# Patient Record
Sex: Female | Born: 1980 | Race: White | Hispanic: No | Smoking: Never smoker
Health system: Southern US, Community
[De-identification: ages and names within clinical notes are randomized; demographics above are authoritative.]

## PROBLEM LIST (undated history)

## (undated) VITALS — BP 93/60 | HR 90 | Temp 97.5°F | Resp 18 | Ht 63.0 in | Wt 134.0 lb

## (undated) DIAGNOSIS — F32A Depression, unspecified: Secondary | ICD-10-CM

## (undated) DIAGNOSIS — M8580 Other specified disorders of bone density and structure, unspecified site: Secondary | ICD-10-CM

## (undated) DIAGNOSIS — R519 Headache, unspecified: Secondary | ICD-10-CM

## (undated) DIAGNOSIS — F419 Anxiety disorder, unspecified: Secondary | ICD-10-CM

## (undated) DIAGNOSIS — K589 Irritable bowel syndrome without diarrhea: Secondary | ICD-10-CM

## (undated) DIAGNOSIS — K219 Gastro-esophageal reflux disease without esophagitis: Secondary | ICD-10-CM

## (undated) DIAGNOSIS — F509 Eating disorder, unspecified: Secondary | ICD-10-CM

## (undated) DIAGNOSIS — G8929 Other chronic pain: Secondary | ICD-10-CM

## (undated) DIAGNOSIS — N814 Uterovaginal prolapse, unspecified: Secondary | ICD-10-CM

## (undated) DIAGNOSIS — E8940 Asymptomatic postprocedural ovarian failure: Secondary | ICD-10-CM

## (undated) DIAGNOSIS — M797 Fibromyalgia: Secondary | ICD-10-CM

## (undated) DIAGNOSIS — F4310 Post-traumatic stress disorder, unspecified: Secondary | ICD-10-CM

## (undated) DIAGNOSIS — M810 Age-related osteoporosis without current pathological fracture: Secondary | ICD-10-CM

## (undated) DIAGNOSIS — R51 Headache: Secondary | ICD-10-CM

## (undated) DIAGNOSIS — K449 Diaphragmatic hernia without obstruction or gangrene: Secondary | ICD-10-CM

## (undated) DIAGNOSIS — F329 Major depressive disorder, single episode, unspecified: Secondary | ICD-10-CM

## (undated) HISTORY — DX: Diaphragmatic hernia without obstruction or gangrene: K44.9

## (undated) HISTORY — DX: Anxiety disorder, unspecified: F41.9

## (undated) HISTORY — DX: Headache, unspecified: R51.9

## (undated) HISTORY — PX: BILATERAL OOPHORECTOMY: SHX1221

## (undated) HISTORY — DX: Uterovaginal prolapse, unspecified: N81.4

## (undated) HISTORY — PX: ABDOMINAL HYSTERECTOMY: SHX81

## (undated) HISTORY — PX: RECTOCELE REPAIR: SHX761

## (undated) HISTORY — DX: Asymptomatic postprocedural ovarian failure: E89.40

## (undated) HISTORY — DX: Age-related osteoporosis without current pathological fracture: M81.0

## (undated) HISTORY — DX: Gastro-esophageal reflux disease without esophagitis: K21.9

## (undated) HISTORY — DX: Headache: R51

## (undated) HISTORY — DX: Fibromyalgia: M79.7

## (undated) HISTORY — DX: Irritable bowel syndrome, unspecified: K58.9

## (undated) HISTORY — DX: Other chronic pain: G89.29

---

## 2005-09-16 ENCOUNTER — Emergency Department (HOSPITAL_COMMUNITY): Admission: EM | Admit: 2005-09-16 | Discharge: 2005-09-16 | Payer: Self-pay | Admitting: Emergency Medicine

## 2006-06-13 ENCOUNTER — Other Ambulatory Visit: Admission: RE | Admit: 2006-06-13 | Discharge: 2006-06-13 | Payer: Self-pay | Admitting: Obstetrics and Gynecology

## 2006-06-27 ENCOUNTER — Encounter: Admission: RE | Admit: 2006-06-27 | Discharge: 2006-06-27 | Payer: Self-pay | Admitting: Obstetrics and Gynecology

## 2006-06-27 LAB — HM DEXA SCAN

## 2006-09-26 ENCOUNTER — Emergency Department (HOSPITAL_COMMUNITY): Admission: EM | Admit: 2006-09-26 | Discharge: 2006-09-26 | Payer: Self-pay | Admitting: Emergency Medicine

## 2006-10-07 ENCOUNTER — Ambulatory Visit (HOSPITAL_COMMUNITY): Admission: RE | Admit: 2006-10-07 | Discharge: 2006-10-07 | Payer: Self-pay | Admitting: Obstetrics and Gynecology

## 2006-10-28 ENCOUNTER — Inpatient Hospital Stay (HOSPITAL_COMMUNITY): Admission: AD | Admit: 2006-10-28 | Discharge: 2006-10-28 | Payer: Self-pay | Admitting: Obstetrics and Gynecology

## 2007-01-30 ENCOUNTER — Inpatient Hospital Stay (HOSPITAL_COMMUNITY): Admission: AD | Admit: 2007-01-30 | Discharge: 2007-01-30 | Payer: Self-pay | Admitting: Obstetrics and Gynecology

## 2007-03-16 ENCOUNTER — Inpatient Hospital Stay (HOSPITAL_COMMUNITY): Admission: AD | Admit: 2007-03-16 | Discharge: 2007-03-16 | Payer: Self-pay | Admitting: Obstetrics and Gynecology

## 2007-04-03 ENCOUNTER — Inpatient Hospital Stay (HOSPITAL_COMMUNITY): Admission: AD | Admit: 2007-04-03 | Discharge: 2007-04-04 | Payer: Self-pay | Admitting: Obstetrics and Gynecology

## 2007-05-19 ENCOUNTER — Inpatient Hospital Stay (HOSPITAL_COMMUNITY): Admission: AD | Admit: 2007-05-19 | Discharge: 2007-05-21 | Payer: Self-pay | Admitting: Obstetrics and Gynecology

## 2008-11-18 ENCOUNTER — Inpatient Hospital Stay (HOSPITAL_COMMUNITY): Admission: AD | Admit: 2008-11-18 | Discharge: 2008-11-19 | Payer: Self-pay | Admitting: Obstetrics and Gynecology

## 2009-01-13 ENCOUNTER — Inpatient Hospital Stay (HOSPITAL_COMMUNITY): Admission: AD | Admit: 2009-01-13 | Discharge: 2009-01-13 | Payer: Self-pay | Admitting: Obstetrics and Gynecology

## 2009-04-22 ENCOUNTER — Encounter (INDEPENDENT_AMBULATORY_CARE_PROVIDER_SITE_OTHER): Payer: Self-pay | Admitting: Obstetrics and Gynecology

## 2009-04-22 ENCOUNTER — Inpatient Hospital Stay (HOSPITAL_COMMUNITY): Admission: AD | Admit: 2009-04-22 | Discharge: 2009-04-24 | Payer: Self-pay | Admitting: Obstetrics and Gynecology

## 2009-07-12 ENCOUNTER — Encounter: Admission: RE | Admit: 2009-07-12 | Discharge: 2009-07-12 | Payer: Self-pay | Admitting: Gastroenterology

## 2009-08-17 ENCOUNTER — Emergency Department (HOSPITAL_COMMUNITY): Admission: EM | Admit: 2009-08-17 | Discharge: 2009-08-17 | Payer: Self-pay | Admitting: Emergency Medicine

## 2009-08-17 ENCOUNTER — Emergency Department (HOSPITAL_COMMUNITY): Admission: EM | Admit: 2009-08-17 | Discharge: 2009-08-18 | Payer: Self-pay | Admitting: Pediatric Emergency Medicine

## 2010-02-16 ENCOUNTER — Encounter (INDEPENDENT_AMBULATORY_CARE_PROVIDER_SITE_OTHER): Payer: Self-pay | Admitting: *Deleted

## 2010-02-24 ENCOUNTER — Ambulatory Visit: Payer: Self-pay

## 2010-03-21 ENCOUNTER — Encounter (INDEPENDENT_AMBULATORY_CARE_PROVIDER_SITE_OTHER): Payer: Self-pay | Admitting: *Deleted

## 2010-03-21 ENCOUNTER — Ambulatory Visit: Payer: Self-pay | Admitting: Gastroenterology

## 2010-03-21 DIAGNOSIS — K5900 Constipation, unspecified: Secondary | ICD-10-CM | POA: Insufficient documentation

## 2010-03-21 DIAGNOSIS — R634 Abnormal weight loss: Secondary | ICD-10-CM

## 2010-03-21 DIAGNOSIS — F341 Dysthymic disorder: Secondary | ICD-10-CM | POA: Insufficient documentation

## 2010-03-21 LAB — CONVERTED CEMR LAB
ALT: 18 units/L (ref 0–35)
Albumin: 4.5 g/dL (ref 3.5–5.2)
BUN: 13 mg/dL (ref 6–23)
Basophils Absolute: 0.1 10*3/uL (ref 0.0–0.1)
Basophils Relative: 1.1 % (ref 0.0–3.0)
CO2: 32 meq/L (ref 19–32)
Calcium: 10.5 mg/dL (ref 8.4–10.5)
Chloride: 102 meq/L (ref 96–112)
Creatinine, Ser: 0.7 mg/dL (ref 0.4–1.2)
Ferritin: 26.7 ng/mL (ref 10.0–291.0)
Glucose, Bld: 74 mg/dL (ref 70–99)
HCT: 41.3 % (ref 36.0–46.0)
Hemoglobin: 14.3 g/dL (ref 12.0–15.0)
IgA: 246 mg/dL (ref 68–378)
Lymphocytes Relative: 32.4 % (ref 12.0–46.0)
Lymphs Abs: 2 10*3/uL (ref 0.7–4.0)
Magnesium: 1.9 mg/dL (ref 1.5–2.5)
Monocytes Relative: 6.8 % (ref 3.0–12.0)
Neutro Abs: 3.6 10*3/uL (ref 1.4–7.7)
RBC: 4.72 M/uL (ref 3.87–5.11)
RDW: 12.6 % (ref 11.5–14.6)
Sed Rate: 8 mm/hr (ref 0–22)
TSH: 1.37 microintl units/mL (ref 0.35–5.50)
Total Protein: 7.5 g/dL (ref 6.0–8.3)
Transferrin: 235.1 mg/dL (ref 212.0–360.0)
Vitamin B-12: 616 pg/mL (ref 211–911)

## 2010-04-06 ENCOUNTER — Telehealth (INDEPENDENT_AMBULATORY_CARE_PROVIDER_SITE_OTHER): Payer: Self-pay | Admitting: *Deleted

## 2010-04-07 ENCOUNTER — Ambulatory Visit: Payer: Self-pay | Admitting: Gastroenterology

## 2010-04-07 LAB — HM COLONOSCOPY: HM COLON: NORMAL

## 2010-04-11 ENCOUNTER — Telehealth: Payer: Self-pay | Admitting: Gastroenterology

## 2010-04-14 ENCOUNTER — Telehealth: Payer: Self-pay | Admitting: Gastroenterology

## 2010-04-14 ENCOUNTER — Encounter: Payer: Self-pay | Admitting: Gastroenterology

## 2010-05-01 ENCOUNTER — Telehealth: Payer: Self-pay | Admitting: Gastroenterology

## 2010-06-04 ENCOUNTER — Emergency Department (HOSPITAL_COMMUNITY): Admission: EM | Admit: 2010-06-04 | Discharge: 2010-06-04 | Payer: Self-pay | Admitting: Emergency Medicine

## 2010-07-14 ENCOUNTER — Telehealth (INDEPENDENT_AMBULATORY_CARE_PROVIDER_SITE_OTHER): Payer: Self-pay | Admitting: *Deleted

## 2010-09-14 ENCOUNTER — Emergency Department (HOSPITAL_COMMUNITY): Admission: EM | Admit: 2010-09-14 | Discharge: 2010-09-14 | Payer: Self-pay | Admitting: Emergency Medicine

## 2010-12-07 ENCOUNTER — Ambulatory Visit (HOSPITAL_COMMUNITY)
Admission: RE | Admit: 2010-12-07 | Discharge: 2010-12-08 | Payer: Self-pay | Source: Home / Self Care | Attending: Obstetrics and Gynecology | Admitting: Obstetrics and Gynecology

## 2010-12-07 LAB — CBC
HCT: 41.1 % (ref 36.0–46.0)
Hemoglobin: 14 g/dL (ref 12.0–15.0)
RDW: 12 % (ref 11.5–15.5)
WBC: 5.3 10*3/uL (ref 4.0–10.5)

## 2010-12-07 LAB — PREGNANCY, URINE: Preg Test, Ur: NEGATIVE

## 2010-12-08 LAB — CBC
HCT: 32.8 % — ABNORMAL LOW (ref 36.0–46.0)
MCV: 88.2 fL (ref 78.0–100.0)
Platelets: 208 10*3/uL (ref 150–400)
RBC: 3.72 MIL/uL — ABNORMAL LOW (ref 3.87–5.11)
RDW: 11.7 % (ref 11.5–15.5)
WBC: 8.3 10*3/uL (ref 4.0–10.5)

## 2010-12-10 NOTE — Op Note (Signed)
  Regina Steele, Regina Steele             ACCOUNT NO.:  [id number]  MEDICAL RECORD NO.:  [id number]          PATIENT TYPE:  OIB  LOCATION:  9318                          FACILITY:  [facility]  PHYSICIAN:  Crist Fat. Rivard, M.D. DATE OF BIRTH:  [birth year]  DATE OF PROCEDURE:  12/07/2010 DATE OF DISCHARGE:                              OPERATIVE REPORT   PREOPERATIVE DIAGNOSIS:  Symptomatic rectocele.  POSTOPERATIVE DIAGNOSIS:  Symptomatic rectocele.  ANESTHESIA:  General, Brayton Caves, MD  PROCEDURE:  Posterior repair.  SURGEON:  Crist Fat. Rivard, MD  ASSISTANT:  Elmira J. Lowell Guitar, PA  ESTIMATED BLOOD LOSS:  Minimal.  DESCRIPTION OF PROCEDURE:  After being informed of the planned procedure with possible complications including bleeding, infection, and injury to other organs, informed consent was obtained.  The patient was taken to OR #4, given general anesthesia with endotracheal intubation without any complication.  She was placed in lithotomy position, prepped and draped in a sterile fashion with a Foley catheter in her bladder and knee-high sequential compressive devices.  The posterior fourchette was grasped with 2 Allis forceps and we infiltrated perineum with lidocaine 1% epinephrine 1:200,000 and pursued that infiltration on the posterior vaginal mucosa all the way to about 2 cm of the posterior cul-de-sac. We excised a triangle of perineum skin which gives [place] access to the posterior vaginal mucosa which was undermined with scissors and dissected medially all the way to 2 cm from the posterior cul-de-sac. With Allis forceps, we are now able to bluntly and sharply dissect the prerectal fascia from the posterior vaginal mucosa until we can completely correct the rectocele.  The rectocele was then corrected with figure-of-U stitches of 2-0 Vicryl to plicate the prerectal fascia until the rectocele is resolved.  Excess vaginal mucosa was removed and we closed the vaginal mucosa  using running lock suture of 3-0 Vicryl.  Due to the thinness of the tissues, there was a laceration on the left labia minora which was repaired with simple sutures of 4-0 Vicryl.  The perineum was closed with a simple suture of 3-0 Vicryl and a subcuticular suture of 3-0 Vicryl.  Postprocedure, there was more bleeding than is acceptable and packing with a 1-inch mesh with Estrace cream and pressure applied for 10 minutes, did not resolve the issue, that packing was removed, and a 2-inch packing with Estrace cream was applied which gave [place] much better pressure and control of the oozing from the posterior repair. Instrument and sponge count is complete x2.  Estimated blood loss is minimal.  The procedure was well tolerated by the patient who was taken to the recovery room in a well and stable condition.  SPECIMENS:  Perineal biopsy sent to rule out endometriosis.     Crist Fat Rivard, M.D.     SAR/MEDQ  D:  12/07/2010  T:  12/08/2010  Job:  [id number]  Electronically Signed by Silverio Lay M.D. on 12/10/2010 11:02:36 AM

## 2010-12-12 NOTE — Progress Notes (Signed)
Summary: Dizzy since procedure friday  Phone Note Call from Patient Call back at [phone]   Call For: DR Lizann Edelman Reason for Call: Talk to Nurse Summary of Call: Is feeling extremely dizzy still since procedure last friday. Initial call taken by: Leanor Kail [facility],  Apr 11, 2010 11:32 AM  Follow-up for Phone Call        spoke with patient about dizziness. she denies any other symptoms,no fever,n/v,pain. states she is ok sitting that dizzy when moving around since procedure. Encouraged patient to take it easy until I speak with Dr.Ladona Rosten. Follow-up by: Sherren Kerns RN,  Apr 11, 2010 11:49 AM  Additional Follow-up for Phone Call Additional follow up Details #1::        Call i care.Marland KitchenMarland Kitchenprobable functional problem.... Additional Follow-up by: Mardella Layman MD FACG,  Apr 11, 2010 11:58 AM    Additional Follow-up for Phone Call Additional follow up Details #2::    LM for pt to call.  Ashok Cordia RN  Apr 11, 2010 12:30 PM

## 2010-12-12 NOTE — Letter (Signed)
Summary: New Patient letter  [facility]  [address] [place], [place] [address]   Phone: [phone]  Fax: [phone]       02/16/2010 MRN: [id number]  [facility] [address] [place], [place]  [address]  Dear Ms. Chamorro,  Welcome to the Gastroenterology Division at [facility].    You are scheduled to see Dr. Jarold Motto on 03-16-10 at 8:30a.m. on the 3rd floor at [facility], [address]  We ask that you try to arrive at our office 15 minutes prior to your appointment time to allow for check-in.  We would like you to complete the enclosed self-administered evaluation form prior to your visit and bring it with you on the day of your appointment.  We will review it with you.  Also, please bring a complete list of all your medications or, if you prefer, bring the medication bottles and we will list them.  Please bring your insurance card so that we may make a copy of it.  If your insurance requires a referral to see a specialist, please bring your referral form from your primary care physician.  Co-payments are due at the time of your visit and may be paid by cash, check or credit card.     Your office visit will consist of a consult with your physician (includes a physical exam), any laboratory testing he/she may order, scheduling of any necessary diagnostic testing (e.g. x-ray, ultrasound, CT-scan), and scheduling of a procedure (e.g. Endoscopy, Colonoscopy) if required.  Please allow enough time on your schedule to allow for any/all of these possibilities.    If you cannot keep your appointment, please call [phone] to cancel or reschedule prior to your appointment date.  This allows [place] the opportunity to schedule an appointment for another patient in need of care.  If you do not cancel or reschedule by 5 p.m. the business day prior to your appointment date, you will be charged a $50.00 late cancellation/no-show fee.    Thank you for  choosing [facility] for your medical needs.  We appreciate the opportunity to care for you.  Please visit [place] at our website  to learn more about our practice.                     Sincerely,                                                             The Gastroenterology Division

## 2010-12-12 NOTE — Progress Notes (Signed)
Summary: Biopsy results  Phone Note Call from Patient Call back at Home Phone [phone]   Call For: Dr Jarold Motto Reason for Call: Lab or Test Results Summary of Call: Biopsy results Initial call taken by: Leanor Kail [facility],  April 14, 2010 10:13 AM  Follow-up for Phone Call        call placed to pt. message left. Follow-up by: Greer Ee RN,  April 14, 2010 2:17 PM  Additional Follow-up for Phone Call Additional follow up Details #1::        Patient did not return phone called results were mailed to patient.  Additional Follow-up by: Georga Bora,  April 18, 2010 10:29 AM

## 2010-12-12 NOTE — Progress Notes (Signed)
Summary: labs   Phone Note Call from Patient Call back at [phone]   Caller: Patient Call For: Dr. Jarold Motto Reason for Call: Lab or Test Results Summary of Call: would like to discuss lab results Initial call taken by: Vallarie Mare,  May 01, 2010 9:36 AM  Follow-up for Phone Call        Discussed labs and biopsy results with pt. Follow-up by: Ashok Cordia RN,  May 01, 2010 2:12 PM

## 2010-12-12 NOTE — Assessment & Plan Note (Signed)
Summary: excessive weight loss--ch.   History of Present Illness Visit Type: Initial Consult Primary GI MD: Sheryn Bison MD FACP FAGA Primary Provider: Allayne Butcher, MD Requesting Provider: Allayne Butcher, MD Chief Complaint: Pt has been diagnosed with a rectocele in [facility]. Pt is here b/c she has had weight loss since November of last year. She intermittant abd pain after meals. Also she is very constipated and has to use enemas sometimes to eliminate.  History of Present Illness:   30 year old [name] referred by Dr. Allayne Butcher for evaluation of abdominal gas, bloating, cramping, distention, severe constipation and problems with rectal emptying. Because of worsening gas and bloating and abdominal pain with almost any food, she has had a 15 pound weight loss since November. 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  She denies any specific food intolerances except  fiber and lactose.she There is no family history of celiac disease. She has tried Librarian, academic probiotics with worsening of her condition. She also had no response to regular MiraLax. She denies any associated bladder emptying problems or other neuromuscular dysfunction. She denies any upper gastrointestinal or hepatobiliary complaints. She specifically denies dysphasia, GERD, history of hepatitis or pancreatitis. There are no systemic complaints such as fever, chills, skin rashes, joint pains, or oral stomatitis.  She does have osteopenia and is on calcium and vitamin D, also daily Lexapro 10 mg for depression, estradiol, and apparently has an IUD in place. She does not menstruate normally but denies any possibility of pregnancy. I do not have her records from [facility] for review. Labs show recent normal thyroid function test, CBC, and metabolic profile except for borderline serum glucose  of 61 mg percent.She denies foreign travel or sick family members at home. Her grandmother apparently had celiac disease. She has never smoked or use alcohol.   GI Review of Systems    Reports abdominal pain, loss of appetite, nausea, and  weight loss.     Location of  Abdominal pain: generalized. Weight loss of 15 pounds over 6 months.   Denies acid reflux, belching, bloating, chest pain, dysphagia with liquids, dysphagia with solids, heartburn, vomiting, vomiting blood, and  weight gain.      Reports constipation.     Denies anal fissure, black tarry stools, change in bowel habit, diarrhea, diverticulosis, fecal incontinence, heme positive stool, hemorrhoids, irritable bowel syndrome, jaundice, light color stool, liver problems, rectal bleeding, and  rectal pain.    Current Medications (verified): 1)  Estradiol 2 Mg Tabs (Estradiol) .... One Tablet By Mouth Once Daily 2)  Lexapro 10 Mg Tabs (Escitalopram Oxalate) .... One Tablet By Mouth Once Daily 3)  Rhinocort Aqua 32 Mcg/act Susp (Budesonide) .... As Needed  Allergies (verified): No Known Drug Allergies  Past History:  Past medical, surgical, family and social histories (including risk factors) reviewed for relevance to current acute and chronic problems.  Past Medical History: Depression Anemia Arrhythmia Chronic Headaches Urinary Tract Infection  Past Surgical History: Bilatersl oophorectomy C-section x 2  Family History: Reviewed history from 03/20/2010 and no changes required. Family History of Breast Cancer: Grandmother Family History of Diabetes:  grandfather Family History of Celiac Disease:Grandmother Family History of Heart Disease: Father  Social History: Reviewed history from 03/20/2010 and no changes required. 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  Alcohol Use - no Illicit Drug Use - no Daily Caffeine Use  Review of Systems       The patient complains of allergy/sinus, back  pain, fatigue, heart rhythm changes, night sweats, and shortness of breath.  The patient denies nausea, vomiting, hypoglycemia, palpitations, excessive diaphoresis, tremor, polyuria, erectile dysfunction, anxiety, fever, weight loss, weight gain, vision loss, hoarseness, chest pain, syncope, dyspnea on exertion, peripheral edema, prolonged cough, headaches, hemoptysis, abdominal pain, hematochezia, severe indigestion/heartburn, hematuria, incontinence, muscle weakness, suspicious skin lesions, depression, unusual weight change, angioedema, and breast masses.   General:  Complains of sweats, fatigue, and weight loss; denies fever, chills, anorexia, weakness, malaise, and sleep disorder. ENT:  Denies earache, ear discharge, tinnitus, decreased hearing, nasal congestion, loss of smell, nosebleeds, sore throat, hoarseness, and difficulty swallowing. CV:  Complains of palpitations; denies chest pains, angina, syncope, dyspnea on exertion, orthopnea, PND, peripheral edema, and claudication; She currently has a Holter monitor placed to evaluate nonspecific arrhythmias.[name] Resp:  Complains of dyspnea with exercise; denies dyspnea at rest, cough, sputum, wheezing, coughing up blood, and pleurisy. GI:  Complains of abdominal pain, gas/bloating, constipation, and change in bowel habits; denies difficulty swallowing, pain on swallowing, nausea, indigestion/heartburn, vomiting, vomiting blood, jaundice, diarrhea, bloody BM's, black BMs, and fecal incontinence. GU:  Denies urinary burning, blood in urine, nocturnal urination, urinary frequency, urinary incontinence, abnormal vaginal bleeding, amenorrhea, menorrhagia, vaginal discharge, pelvic pain, genital sores, painful intercourse, and decreased libido. MS:  Complains of low back pain; denies joint pain / LOM, joint swelling, joint stiffness, joint deformity, muscle weakness, muscle cramps, muscle atrophy, leg pain at night, leg pain with exertion, and shoulder pain / LOM  hand / wrist pain (CTS); history of coccydynia.. Derm:  Denies rash, itching, dry skin, hives, moles, warts, and unhealing ulcers. Neuro:  Denies weakness, paralysis, abnormal sensation, seizures, syncope, tremors, vertigo, transient blindness, frequent falls, frequent headaches, difficulty walking, headache, sciatica, radiculopathy other:, restless legs, memory loss, and confusion. Psych:  Complains of depression; denies anxiety, memory loss, suicidal ideation, hallucinations, paranoia, phobia, and confusion; chronically on Lexapro.. Endo:  Complains of heat intolerance; denies cold intolerance, polydipsia, polyphagia, polyuria, unusual weight change, and hirsutism. Heme:  Denies bruising, bleeding, enlarged lymph nodes, and pagophagia. Allergy:  Complains of hay fever.  Vital Signs:  Patient profile:   30 year old female Height:      62 inches Weight:      106.50 pounds BMI:     19.55 Pulse rate:   76 / minute Pulse rhythm:   regular BP sitting:   102 / 64  (right arm) Cuff size:   regular  Vitals Entered By: Christie Nottingham CMA Duncan Dull) (Mar 21, 2010 10:14 AM)  Physical Exam  General:  Well developed, well nourished, no acute distress.healthy appearing.   Head:  Normocephalic and atraumatic. Eyes:  PERRLA, no icterus.exam deferred to patient's ophthalmologist.   Neck:  Supple; no masses or thyromegaly. Lungs:  Clear throughout to auscultation. Heart:  Regular rate and rhythm; no murmurs, rubs,  or bruits. Abdomen:  Soft, nontender and nondistended. No masses, hepatosplenomegaly or hernias noted. Normal bowel sounds. Rectal:  Normal exam.hemoccult positive.   Msk:  Symmetrical with no gross deformities. Normal posture. Pulses:  Normal pulses noted. Extremities:  No clubbing, cyanosis, edema or deformities noted. Neurologic:  Alert and  oriented x4;  grossly normal neurologically. Cervical Nodes:  No significant cervical adenopathy. Psych:  Alert and cooperative. Normal mood and  affect.agitated.     Impression & Recommendations:  Problem # 1:  CONSTIPATION (ICD-564.00) Assessment Deteriorated Symptoms certainly suggestive of colonic  inertia and possible rectal outlet dysfunction. Typically, these patients have worsening of their symptoms with fiber supplementation. On rectal exam I cannot appreciate a significant rectocele. We will request records from [facility] for review. Her guaiac positive stool is noted, and mandates colonoscopy exam. Labs have been ordered and we will do colonoscopy, endoscopy, small bowel biopsy. She does have a family history of celiac disease.She does have a history of possible pudendal nerve damage during her second delivery which was vaginal in nature. She also has had removal of both of her ovaries because of recurrent ovarian cysts.  Problem # 2:  WEIGHT LOSS (ICD-783.21) Assessment: Deteriorated This patient is really afraid to eat because of worsening of her gas, bloating, and abdominal cramping with almost any food. 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 As above, motility records are requested. I will give her a trial of Amitiza 8 micrograms twice a day and ask her to suspend breast-feeding during this therapeutic trial. Labs, anemia profile, and celiac serologies all ordered.  Problem # 3:  ANXIETY DEPRESSION (ICD-300.4) Assessment: Unchanged Apparently, she has postpartum depression and had been on Lexapro for over one year. She denies any psychotic symptomatology at this time. Her OB/GYN doctor is Dr. Estanislado Pandy.  Patient Instructions: 1)  Please go to the basement for lab work. 2)  Begin Amitiza two times a day...8 micrograms..no breast-feeding during this therapeutic trial 3)  You are scheduled for an Endoscopy and colonoscopy. 4)  The medication list was reviewed and reconciled.  All changed / newly prescribed medications were explained.  A complete medication list was provided to the patient / caregiver. 5)  Copy  sent to :Dr. Allayne Butcher and Dr. Estanislado Pandy in OB/GYN 6)  Please continue current medications.  7)  Constipation and Hemorrhoids brochure given.  8)  Colonoscopy and Flexible Sigmoidoscopy brochure given.  9)  Conscious Sedation brochure given.  10)  Upper Endoscopy brochure given.   Appended Document: excessive weight loss--ch. Libras sent by mistake,  called [facility] and cancelled the Rx.   Clinical Lists Changes  Medications: Added new medication of MOVIPREP 100 GM  SOLR (PEG-KCL-NACL-NASULF-NA ASC-C) As per prep instructions. - Signed Added new medication of AMITIZA 8 MCG  CAPS (LUBIPROSTONE) 1 two times a day/take with food and water - Signed Added new medication of CLIDINIUM-CHLORDIAZEPOXIDE 2.5-5 MG CAPS (CLIDINIUM-CHLORDIAZEPOXIDE) 1 by mouth three times a day as needed for spasms - Signed Removed medication of CLIDINIUM-CHLORDIAZEPOXIDE 2.5-5 MG CAPS (CLIDINIUM-CHLORDIAZEPOXIDE) 1 by mouth three times a day as needed for spasms Rx of MOVIPREP 100 GM  SOLR (PEG-KCL-NACL-NASULF-NA ASC-C) As per prep instructions.;  #1 x 0;  Signed;  Entered by: Ashok Cordia RN;  Authorized by: Mardella Layman MD [facility];  Method used: [facility] [phone]*, [address] [place], [place]  [address], Ph: [phone] or [phone], Fax: [phone] Rx of AMITIZA 8 MCG  CAPS (LUBIPROSTONE) 1 two times a day/take with food and water;  #60 x 6;  Signed;  Entered by: Ashok Cordia RN;  Authorized by: Mardella Layman MD [facility];  Method used: Electronically to [facility] [phone]*, [address], [place], [place]  [address], Ph: [phone] or [phone], Fax: [phone] Rx of CLIDINIUM-CHLORDIAZEPOXIDE 2.5-5 MG CAPS (CLIDINIUM-CHLORDIAZEPOXIDE) 1 by mouth three times a day as needed for spasms;  #60 x 3;  Signed;  Entered by: Ashok Cordia RN;  Authorized by: Mardella Layman MD [facility];  Method used: [facility]  [address], [place], [place]  [address], Ph: [phone] or  [phone], Fax: [phone] Orders: Added new Test order of Colon/Endo (Colon/Endo) - Signed    Prescriptions: CLIDINIUM-CHLORDIAZEPOXIDE 2.5-5 MG CAPS (CLIDINIUM-CHLORDIAZEPOXIDE) 1 by mouth three times a day as needed for spasms  #60 x 3   Entered by:   Ashok Cordia RN   Authorized by:   Mardella Layman MD [facility]   Signed by:   Ashok Cordia RN on 03/21/2010   Method used:   [facility] to        [facility] [phone]* (retail)       [address]       [place], [place]  [address]       Ph: [phone] or [phone]       Fax: [phone]   RxID:   [id number] AMITIZA 8 MCG  CAPS (LUBIPROSTONE) 1 two times a day/take with food and water  #60 x 6   Entered by:   Ashok Cordia RN   Authorized by:   Mardella Layman MD [facility]   Signed by:   Ashok Cordia RN on 03/21/2010   Method used:   [facility]ronically to        [facility] [phone][facility] (retail)       [address]       [place], [place]  [address]       Ph: [phone] or [phone]       Fax: [phone]   RxID:   [id number] MOVIPREP 100 GM  SOLR (PEG-KCL-NACL-NASULF-NA ASC-C) As per prep instructions.  #1 x 0   Entered by:   Ashok Cordia RN   Authorized by:   Mardella Layman MD [facility]   Signed by:   Ashok Cordia RN on 03/21/2010   Method used:   [facility]ronically to        [facility] [phone]* (retail)       [address]       [place], [place]  [address]       Ph: [phone] or [phone]       Fax: [phone]   RxID:   [id number]

## 2010-12-12 NOTE — Progress Notes (Signed)
Summary: ? re prep instruction  Phone Note Call from Patient Call back at [phone]   Caller: Patient Call For: Pattero Reason for Call: Talk to Nurse Summary of Call: Patient has question regardinig new med that was given to her by her dermatologist (Taganet) and wants to [facility] when is she suppose to stop her clear liquid Initial call taken by: Tawni Levy,  Apr 06, 2010 10:17 AM  Follow-up for Phone Call        Left message for pt to call office back Follow-up by: Karl Bales RN,  Apr 06, 2010 11:41 AM    Additional Follow-up for Phone Call Additional follow up Details #2::    Attempted to reach pt again at all three phone numbers.  No i.d. on two numbers and unable to reach pt at third number.  Follow-up by: Karl Bales RN,  Apr 06, 2010 4:30 PM  Additional Follow-up for Phone Call Additional follow up Details #3:: Details for Additional Follow-up Action Taken: unable to reach pt after several attepmpts.

## 2010-12-12 NOTE — Progress Notes (Signed)
  Phone Note Other Incoming   Request: Send information Summary of Call: Request received from Disability Determination Services forwarded to [employer].       

## 2010-12-12 NOTE — Procedures (Signed)
Summary: Colonoscopy  Patient: [name] Note: All result statuses are Final unless otherwise noted.  Tests: (1) Colonoscopy (COL)   COL Colonoscopy           DONE     [facility]     [address]     [place], [place]  [address]           COLONOSCOPY PROCEDURE REPORT           PATIENT:  Regina Steele, Regina Steele  MR#:  [id number]     BIRTHDATE:  [birth year], 28 yrs. old  GENDER:  female     ENDOSCOPIST:  Vania Rea. Jarold Motto, MD, [facility]     REF. BY:     PROCEDURE DATE:  04/07/2010     PROCEDURE:  Average-risk screening colonoscopy     G0121     ASA CLASS:  Class I     INDICATIONS:  FOBT positive stool, abdominal pain, constipation     MEDICATIONS:   Fentanyl 75 mcg IV, Versed 7 mg IV           DESCRIPTION OF PROCEDURE:   After the risks benefits and     alternatives of the procedure were thoroughly explained, informed     consent was obtained.  No rectal exam performed. The LB PCF-H180AL     [id number] endoscope was introduced through the anus and advanced to     the cecum, which was identified by both the appendix and ileocecal     valve, limited by a redundant colon.    The quality of the prep     was excellent, using MoviPrep.  The instrument was then slowly     withdrawn as the colon was fully examined.     <<PROCEDUREIMAGES>>           FINDINGS:  No polyps or cancers were seen.  This was otherwise a     normal examination of the colon.   Retroflexed views in the rectum     revealed no abnormalities.    The scope was then withdrawn from     the patient and the procedure completed.           COMPLICATIONS:  None     ENDOSCOPIC IMPRESSION:     1) No polyps or cancers     2) Otherwise normal examination     CONSTIPATION PREDOMINANT IBS.     RECOMMENDATIONS:     1) Upper endoscopy will be scheduled     DAILY MIRALAX.     REPEAT EXAM:  No           ______________________________     Vania Rea. Jarold Motto, MD, [facility]           CC:  Frazier Richards MDSandra Rivard, MD        n.     Rosalie Doctor:   Vania Rea. Navie Lamoreaux at 04/07/2010 02:02 PM           Jamison, Ashok Cordia, [id number]  Note: An exclamation mark (!) indicates a result that was not dispersed into the flowsheet. Document Creation Date: 04/07/2010 2:03 PM _______________________________________________________________________  (1) Order result status: Final Collection or observation date-time: 04/07/2010 13:55 Requested date-time:  Receipt date-time:  Reported date-time:  Referring Physician:   Ordering Physician: Sheryn Bison [phone]) Specimen Source:  Source: Launa Grill Order Number: [id number] Lab site:

## 2010-12-12 NOTE — Procedures (Signed)
Summary: Upper Endoscopy  Patient: Regina Steele Note: All result statuses are Final unless otherwise noted.  Tests: (1) Upper Endoscopy (EGD)   EGD Upper Endoscopy       DONE     [facility]     [address]     [place], [place]  [address]           ENDOSCOPY PROCEDURE REPORT           PATIENT:  [name], [name]  MR#:  [id number]     BIRTHDATE:  May 08, 1981, 28 yrs. old  GENDER:  female           ENDOSCOPIST:  Vania Rea. Jarold Motto, MD, [facility]     Referred by:           PROCEDURE DATE:  04/07/2010     PROCEDURE:  EGD with biopsy     ASA CLASS:  Class I     INDICATIONS:  abdominal pain, hemeoccult positive stool           MEDICATIONS:   There was residual sedation effect present from     prior procedure., Versed 1 mg IV     TOPICAL ANESTHETIC:  Exactacain Spray           DESCRIPTION OF PROCEDURE:   After the risks benefits and     alternatives of the procedure were thoroughly explained, informed     consent was obtained.  The LB GIF-H180 [id number] endoscope was     introduced through the mouth and advanced to the second portion of     the duodenum, limited by retching and gagging.   The instrument     was slowly withdrawn as the mucosa was fully examined.     <<PROCEDUREIMAGES>>           A hiatal hernia was found. 4-5 CM HH AND FREE REFLUX NOTED.     Normal GE junction was noted.  Normal duodenal folds were noted.     DUODENAL BIOPSIES DONE.  The stomach was entered and closely     examined. The antrum, angularis, and lesser curvature were well     visualized, including a retroflexed view of the cardia and fundus.     The stomach wall was normally distensable. The scope passed easily     through the pylorus into the duodenum.    Retroflexed views     revealed a hiatal hernia.    The scope was then withdrawn from the     patient and the procedure completed.           COMPLICATIONS:  None           ENDOSCOPIC IMPRESSION:     1) Hiatal hernia     2) Normal GE  junction     3) Normal duodenal folds     4) Normal stomach     5) A hiatal hernia     1. CHRONIC GERD     2.R/O CELIAC DISEASE.BORDERLINE ELEVATED CELIAC SEROLOGIES.     RECOMMENDATIONS:     1) Await biopsy results     TRIAL OF DEXILANT 60 MG/QAM.           REPEAT EXAM:  No           ______________________________     Vania Rea. Jarold Motto, MD, [facility] Graham           CC:  Frazier Richards MD, Silverio Lay, MD  n.     eSIGNED:   Vania Rea. Diksha Tagliaferro at 04/07/2010 02:14 PM           Giampietro, Ashok Cordia, [phone]  Note: An exclamation mark (!) indicates a result that was not dispersed into the flowsheet. Document Creation Date: 04/07/2010 2:15 PM _______________________________________________________________________  (1) Order result status: Final Collection or observation date-time: 04/07/2010 14:06 Requested date-time:  Receipt date-time:  Reported date-time:  Referring Physician:   Ordering Physician: Sheryn Bison [phone]) Specimen Source:  Source: Launa Grill Order Number: [id number] Lab site:   Appended Document: Upper Endoscopy noted

## 2010-12-12 NOTE — Letter (Signed)
Summary: Patient [facility] Biopsy Results  [facility]  [address] [place], [place] [address]   Phone: [phone]  Fax: [phone]        April 14, 2010 MRN: [id number]    [facility] [address] [place], [place]  [address]    Dear Ms. Diers,  I am pleased to inform you that the biopsies taken during your recent endoscopic examination did not show any evidence of cancer upon pathologic examination.Small intestinal biopsy did not show changes of celiac disease.  Additional information/recommendations:  __No further action is needed at this time.  Please follow-up with      your primary care physician for your other healthcare needs.  __ Please call [phone] to schedule a return visit to review      your condition.  _x_ Continue with the treatment plan as outlined on the day of your      exam for acid reflux disease. __ You should have a repeat endoscopic examination for this problem              in _ months/years.   Please call us if you are having persistent problems or have questions about your condition that have not been fully answered at this time.  Sincerely,  Mardella Layman MD [facility]  This letter has been electronically signed by your physician.  Appended Document: Patient Notice-Endo Biopsy Results letter mailed.

## 2010-12-12 NOTE — Miscellaneous (Signed)
Summary: Carafate ordered for home use  Clinical Lists Changes  Medications: Added new medication of CARAFATE 1 GM/10ML  SUSP (SUCRALFATE) 10 cc at bedtime. - Signed Rx of CARAFATE 1 GM/10ML  SUSP (SUCRALFATE) 10 cc at bedtime.;  #1 pt x 0;  Signed;  Entered by: Doristine Church RN II;  Authorized by: Mardella Layman MD [facility];  Method used: [facility] to [id number] [address], [place], [place]  [address], Ph: [phone] or [phone], Fax: [phone] Observations: Added new observation of ALLERGY REV: Done (04/07/2010 14:53) Added new observation of NKA: T (04/07/2010 14:53)    Prescriptions: CARAFATE 1 GM/10ML  SUSP (SUCRALFATE) 10 cc at bedtime.  #1 pt x 0   Entered by:   Doristine Church RN II   Authorized by:   Mardella Layman MD [facility]   Signed by:   Doristine Church RN II on 04/07/2010   Method used:   [facility]        [facility] [phone][facility] (retail)       [address]       [place], [place]  [address]       Ph: [phone] or [phone]       Fax: [phone]   RxID:   [id number]

## 2010-12-12 NOTE — Letter (Signed)
Summary: [facility] Instructions  [facility]  [address] [place], [place] [address]   Phone: [phone]  Fax: [phone]       Regina Steele    [birth year]    MRN: [id number]        Procedure Day /Date: Monday, 03/27/10     Arrival Time: 10:00      Procedure Time: 11:00     Location of Procedure:                    [facility]  [facility] (4th Floor)                        PREPARATION FOR COLONOSCOPY WITH MOVIPREP   Starting 5 days prior to your procedure 03/22/10 do not eat nuts, seeds, popcorn, corn, beans, peas,  salads, or any raw vegetables.  Do not take any fiber supplements (e.g. Metamucil, Citrucel, and Benefiber).  THE DAY BEFORE YOUR PROCEDURE         DATE: 03/26/10    DAY: Sunday  1.  Drink clear liquids the entire day-NO SOLID FOOD  2.  Do not drink anything colored red or purple.  Avoid juices with pulp.  No orange juice.  3.  Drink at least 64 oz. (8 glasses) of fluid/clear liquids during the day to prevent dehydration and help the prep work efficiently.  CLEAR LIQUIDS INCLUDE: Water Jello Ice Popsicles Tea (sugar ok, no milk/cream) Powdered fruit flavored drinks Coffee (sugar ok, no milk/cream) Gatorade Juice: apple, white grape, white cranberry  Lemonade Clear bullion, consomm, broth Carbonated beverages (any kind) Strained chicken noodle soup Hard Candy                             4.  In the morning, mix first dose of MoviPrep solution:    Empty 1 Pouch A and 1 Pouch B into the disposable container    Add lukewarm drinking water to the top line of the container. Mix to dissolve    Refrigerate (mixed solution should be used within 24 hrs)  5.  Begin drinking the prep at 5:00 p.m. The MoviPrep container is divided by 4 marks.   Every 15 minutes drink the solution down to the next mark (approximately 8 oz) until the full liter is complete.   6.  Follow completed prep with 16 oz of clear liquid of your choice (Nothing  red or purple).  Continue to drink clear liquids until bedtime.  7.  Before going to bed, mix second dose of MoviPrep solution:    Empty 1 Pouch A and 1 Pouch B into the disposable container    Add lukewarm drinking water to the top line of the container. Mix to dissolve    Refrigerate  THE DAY OF YOUR PROCEDURE      DATE: 03/27/10   DAY: Monday  Beginning at 6:00 a.m. (5 hours before procedure):         1. Every 15 minutes, drink the solution down to the next mark (approx 8 oz) until the full liter is complete.  2. Follow completed prep with 16 oz. of clear liquid of your choice.    3. You may drink clear liquids until 9:00  (2 HOURS BEFORE PROCEDURE).   MEDICATION INSTRUCTIONS  Unless otherwise instructed, you should take regular prescription medications with a small sip of water   as early as possible  the morning of your procedure.                   OTHER INSTRUCTIONS  You will need a responsible adult at least 31 years of age to accompany you and drive you home.   This person must remain in the waiting room during your procedure.  Wear loose fitting clothing that is easily removed.  Leave jewelry and other valuables at home.  However, you may wish to bring a book to read or  an iPod/MP3 player to listen to music as you wait for your procedure to start.  Remove all body piercing jewelry and leave at home.  Total time from sign-in until discharge is approximately 2-3 hours.  You should go home directly after your procedure and rest.  You can resume normal activities the  day after your procedure.  The day of your procedure you should not:   Drive   Make legal decisions   Operate machinery   Drink alcohol   Return to work  You will receive specific instructions about eating, activities and medications before you leave.    The above instructions have been reviewed and explained to me by   _______________________    I fully understand and can  verbalize these instructions _____________________________ Date _________

## 2010-12-22 NOTE — Discharge Summary (Signed)
  NAMEATHALIE, NEWHARD             ACCOUNT NO.:  [id number]  MEDICAL RECORD NO.:  [id number]          PATIENT TYPE:  OIB  LOCATION:  9318                          [facility]ILITY:  [facility]  PHYSICIAN:  Crist Fat. Susan Arana, M.D. DATE OF BIRTH:  1980/12/17  DATE OF ADMISSION:  12/07/2010 DATE OF DISCHARGE:  12/08/2010                              DISCHARGE SUMMARY   DISCHARGE DIAGNOSIS:  Symptomatic rectocele.  OPERATION:  On the date of admission, the patient underwent posterior colporrhaphy, tolerating procedure well.  HISTORY OF PRESENT ILLNESS:  [name] is a 30 year old married white female para 3-0-0-3 with a longstanding history of pelvic floor dysfunction who presents for a posterior colporrhaphy because of a symptomatic rectocele.  Please see the patient's dictated history and physical examination for details.   Preoperative physical: exam blood pressure 110/80, pulse is 70,  respirations 12, temperature 96 degrees Fahrenheit orally, weight 111 pounds, height 5 feet 2 inches tall, body mass index is 20.  General exam was within normal limits.  Pelvic exam, EGBUS was normal.   Vagina revealed a 2-3/4 some rectocele especially with Valsalva maneuver.   The patient's cervix was nontender without lesions.  The patient's IUD  string was visible at the cervical os.  The patient's uterus appeared  normal size, shape, and consistency withouttenderness.  Her adnexae were  without tenderness or masses.  HOSPITAL COURSE:  On the date of admission, the patient underwent aforementioned procedure tolerating it well.  The patient's postoperative course was unremarkable with her tolerating a regular diet and resuming bowel and bladder function by postop day #1 and therefore deemed ready for discharge home.  The patient's postop hemoglobin/hematocrit were 11.0/ 2.8 (preop hemoglobin/hematocrit 14.0/41.1).  DISCHARGE MEDICATIONS:  The patient was directed to her home medication reconciliation  form.  She was further prescribed Colace 100 mg twice daily until her bowel movements are regular.  Vicodin 1-2 tablets every 4 hours as needed for pain.  Ibuprofen 600 mg with food every 6 hours for 5 days and then as needed for pain, Estrace vaginal cream to apply with her finger daily for 14 days, then 3 times a week for 4 weeks then 1 gram per vagina twice weekly.  FOLLOWUP:  The patient is scheduled for a postoperative visit with Dr. Estanislado Pandy on January 18, 2011, at 9 o'clock a.m.  DISCHARGE INSTRUCTIONS:  She was advised to call for temperature greater than or equal to 100.4 degrees Fahrenheit orally, any excessive pain, bleeding, or other concerns.  She was further advised to avoid driving for 2 weeks, heavy lifting for 6 weeks, intercourse for 6 weeks that she may shower.  She may walk up steps.  The patient's diet is that of a gluten free diet.  Wound care is not applicable.     Elmira J. Lowell Guitar, P.A.-C   ______________________________ Crist Fat Kelley Knoth, M.D.    EJP/MEDQ  D:  12/10/2010  T:  12/11/2010  Job:  [id number]  Electronically Signed by Raylene Everts. on 12/13/2010 10:29:30 AM Electronically Signed by Silverio Lay M.D. on 12/22/2010 03:09:29 PM

## 2010-12-22 NOTE — H&P (Signed)
Regina Steele, Regina Steele             ACCOUNT NO.:  [id number]  MEDICAL RECORD NO.:  [id number]          PATIENT TYPE:  AMB  LOCATION:  SDC                           [facility]  [facility]  PHYSICIAN:  Dois Davenport A. Teanna Elem, M.D. DATE OF BIRTH:  [birth year]  DATE OF ADMISSION: DATE OF DISCHARGE:                             HISTORY & PHYSICAL   HISTORY OF PRESENT ILLNESS:  Regina Steele is a 30 year old married white female para 3-0-0-3 with a longstanding history of pelvic floor dysfunction presenting for a posterior repair because of a symptomatic rectocele.  The patient was evaluated in January 2011 at [facility] for defecatory dysfunction characterized by her inability to have bowel movements without frequent enemas and  her inability to pass flatus.  As a part of her evaluation, the patient was prescribed 10 sessions of biofeedback and was further advised to follow that therapy with surgical correction.  Since that time, the patient was diagnosed with borderline celiac disease and subsequently has changed her diet with some significant improvement in her bowel movement regularity and ease.  She goes on to say she has significantly decreased pelvic pressure, dyspareunia, and urinary tract symptoms.  The patient has consented to proceed with correction of her rectocele.  OBSTETRIC HISTORY:  Gravida 3, para 3-0-0-3.  The patient has had 2 cesarean sections and 1 spontaneous vaginal birth that weighed 7 pounds 7 ounces.  GYNECOLOGIC HISTORY:  Menarche 30 years old.  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  She uses a Mirena IUD.  She has  a remote history of Chlamydia.  Her last normal Pap smear was October 2010, with no history of Pap smear abnormalities.  MEDICAL HISTORY:  Migraines, anemia, depression, dermoid cyst, hiatal hernia, gastroesophageal reflux disease, right wrist  fracture, osteopenia, borderline celiac disease, and ovarian remnant.  SURGICAL HISTORY:  In 2005 bilateral salpingo-oophorectomy because of dermoid cyst.  She denies any history of blood transfusions or problems with anesthesia.  FAMILY HISTORY:  Cardiovascular disease, osteoporosis, asthma, breast cancer in her maternal aunt and maternal grandmother, hypertension, diabetes, migraines, and depression.  SOCIAL HISTORY:  The patient is married and she is unemployed.  HABITS:  She denies any alcohol, tobacco, or illicit drug use.  CURRENT MEDICATIONS:  Deplin 15 mg daily, Lexapro 10 mg daily, Risperdal 0.5 mg four times daily, Xanax 0.5 mg three times daily, estradiol 2 mg daily, vitamin D 1 tablet daily, calcium 1 tablet daily, docusate sodium 100 mg twice daily, multivitamins daily, B complex vitamins daily, Tagamet as needed, and vaginal boric acid suppositories as needed.  ALLERGIES:  The patient has no known drug allergies.  She denies any sensitivities to peanuts, shellfish, soy, or latex.  REVIEW OF SYSTEMS:  The patient does have a history of irregular heartbeat for which she had a negative cardiac workup and states she has not had these symptoms in many years, acid reflux.  She denies any chest pain, shortness of breath, headache, vision changes, cough, dysphagia, nausea, vomiting, diarrhea.  She does admit to coccygeal pain from  time to time but denies any leg numbness, weakness, paresthesias, and except as is mentioned in history present illness, the patient's review of systems is otherwise negative.  PHYSICAL EXAMINATION:  VITAL SIGNS:  Blood pressure 110/80, pulse is 70, respirations 12, temperature 96 degrees Fahrenheit orally, weight 111 pounds, height 5 feet 2 inches tall, body mass index 20. NECK:  Supple without masses.  There is no thyromegaly or cervical adenopathy. HEART:  Regular rate and rhythm. LUNGS:  Clear. BACK:  No CVA tenderness. ABDOMEN:  No  tenderness, masses, or organomegaly. EXTREMITIES: No clubbing, cyanosis, or edema. PELVIC:  EG/BUS is normal.  Vagina reveals 2 to 3 over 4 rectocele, especially with Valsalva maneuver.  Cervix is nontender without lesions. The patient's IUD string is visible at the cervical os.  Uterus appears normal size, shape, and consistency without tenderness.  Adnexa no tenderness or masses.  IMPRESSION:  Symptomatic rectocele.  DISPOSITION:  A discussion was held with the patient regarding indications for her procedure along with its risks which include but are not limited to reaction to anesthesia, damage to adjacent organs, infection, and excessive bleeding.  The patient verbalized understanding of these risk and has consented to proceed with posterior colporrhaphy at [facility] [place] on December 07, 2010.     Regina Steele, Regina Steele   ______________________________ Crist Fat Una Yeomans, M.D.    EJP/MEDQ  D:  12/04/2010  T:  12/05/2010  Job:  [id number]  Electronically Signed by Raylene Everts. on 12/13/2010 10:25:22 AM Electronically Signed by Silverio Lay M.D. on 12/22/2010 03:09:33 PM

## 2011-01-14 ENCOUNTER — Emergency Department (HOSPITAL_COMMUNITY)
Admission: EM | Admit: 2011-01-14 | Discharge: 2011-01-14 | Disposition: A | Discharge status: Home / Self Care | Payer: Self-pay | Attending: Emergency Medicine | Admitting: Emergency Medicine

## 2011-01-14 DIAGNOSIS — Z79899 Other long term (current) drug therapy: Secondary | ICD-10-CM | POA: Insufficient documentation

## 2011-01-14 DIAGNOSIS — F329 Major depressive disorder, single episode, unspecified: Secondary | ICD-10-CM | POA: Insufficient documentation

## 2011-01-14 DIAGNOSIS — X58XXXA Exposure to other specified factors, initial encounter: Secondary | ICD-10-CM | POA: Insufficient documentation

## 2011-01-14 DIAGNOSIS — J3489 Other specified disorders of nose and nasal sinuses: Secondary | ICD-10-CM | POA: Insufficient documentation

## 2011-01-14 DIAGNOSIS — H53149 Visual discomfort, unspecified: Secondary | ICD-10-CM | POA: Insufficient documentation

## 2011-01-14 DIAGNOSIS — H1189 Other specified disorders of conjunctiva: Secondary | ICD-10-CM | POA: Insufficient documentation

## 2011-01-14 DIAGNOSIS — H5710 Ocular pain, unspecified eye: Secondary | ICD-10-CM | POA: Insufficient documentation

## 2011-01-14 DIAGNOSIS — S058X9A Other injuries of unspecified eye and orbit, initial encounter: Secondary | ICD-10-CM | POA: Insufficient documentation

## 2011-01-14 DIAGNOSIS — F3289 Other specified depressive episodes: Secondary | ICD-10-CM | POA: Insufficient documentation

## 2011-01-23 LAB — URINALYSIS, ROUTINE W REFLEX MICROSCOPIC
Bilirubin Urine: NEGATIVE
Glucose, UA: NEGATIVE mg/dL
Hgb urine dipstick: NEGATIVE
Ketones, ur: NEGATIVE mg/dL
Nitrite: NEGATIVE
Protein, ur: NEGATIVE mg/dL
Specific Gravity, Urine: 1.003 — ABNORMAL LOW (ref 1.005–1.030)
Urobilinogen, UA: 0.2 mg/dL (ref 0.0–1.0)
pH: 7 (ref 5.0–8.0)

## 2011-01-23 LAB — WET PREP, GENITAL: Trich, Wet Prep: NONE SEEN

## 2011-01-23 LAB — DIFFERENTIAL
Basophils Absolute: 0 K/uL (ref 0.0–0.1)
Basophils Relative: 0 % (ref 0–1)
Eosinophils Absolute: 0.1 K/uL (ref 0.0–0.7)
Eosinophils Relative: 1 % (ref 0–5)
Lymphocytes Relative: 24 % (ref 12–46)
Lymphs Abs: 1.9 K/uL (ref 0.7–4.0)
Monocytes Absolute: 0.5 10*3/uL (ref 0.1–1.0)
Monocytes Relative: 6 % (ref 3–12)
Neutro Abs: 5.6 10*3/uL (ref 1.7–7.7)
Neutrophils Relative %: 69 % (ref 43–77)

## 2011-01-23 LAB — CBC
HCT: 40.7 % (ref 36.0–46.0)
Hemoglobin: 13.8 g/dL (ref 12.0–15.0)
MCH: 30.1 pg (ref 26.0–34.0)
MCHC: 33.9 g/dL (ref 30.0–36.0)
MCV: 88.7 fL (ref 78.0–100.0)
Platelets: 268 K/uL (ref 150–400)
RBC: 4.59 MIL/uL (ref 3.87–5.11)
RDW: 12.2 % (ref 11.5–15.5)
WBC: 8.1 10*3/uL (ref 4.0–10.5)

## 2011-01-23 LAB — BASIC METABOLIC PANEL
BUN: 7 mg/dL (ref 6–23)
Chloride: 102 mEq/L (ref 96–112)
GFR calc non Af Amer: >60 mL/min (ref >60)
Glucose, Bld: 89 mg/dL (ref 70–99)
Potassium: 4.3 mEq/L (ref 3.5–5.1)
Sodium: 136 mEq/L (ref 135–145)

## 2011-01-23 LAB — BASIC METABOLIC PANEL WITH GFR
CO2: 28 meq/L (ref 19–32)
Calcium: 9.5 mg/dL (ref 8.4–10.5)
Creatinine, Ser: 0.85 mg/dL (ref 0.4–1.2)
GFR calc Af Amer: >60 mL/min (ref >60)

## 2011-01-23 LAB — PREGNANCY, URINE: Preg Test, Ur: NEGATIVE

## 2011-01-23 LAB — GC/CHLAMYDIA PROBE AMP, GENITAL
Chlamydia, DNA Probe: NEGATIVE
GC Probe Amp, Genital: NEGATIVE

## 2011-02-15 LAB — URINALYSIS, ROUTINE W REFLEX MICROSCOPIC
Glucose, UA: NEGATIVE mg/dL
Protein, ur: NEGATIVE mg/dL
Specific Gravity, Urine: 1.006 (ref 1.005–1.030)
pH: 6.5 (ref 5.0–8.0)

## 2011-02-19 LAB — CBC
HCT: 36.2 % (ref 36.0–46.0)
Hemoglobin: 12.6 g/dL (ref 12.0–15.0)
Platelets: 243 10*3/uL (ref 150–400)
RBC: 4.22 MIL/uL (ref 3.87–5.11)
WBC: 9.7 10*3/uL (ref 4.0–10.5)

## 2011-02-19 LAB — RPR: RPR Ser Ql: NONREACTIVE

## 2011-02-22 LAB — URINALYSIS, ROUTINE W REFLEX MICROSCOPIC
Nitrite: NEGATIVE
Specific Gravity, Urine: 1.025 (ref 1.005–1.030)
Urobilinogen, UA: 0.2 mg/dL (ref 0.0–1.0)

## 2011-02-26 LAB — URINALYSIS, ROUTINE W REFLEX MICROSCOPIC
Hgb urine dipstick: NEGATIVE
Ketones, ur: 15 mg/dL — AB
Protein, ur: NEGATIVE mg/dL
Urobilinogen, UA: 0.2 mg/dL (ref 0.0–1.0)

## 2011-03-27 NOTE — Op Note (Signed)
Regina Steele, Regina Steele             ACCOUNT NO.:  [id number]   MEDICAL RECORD NO.:  [id number]          PATIENT TYPE:  INP   LOCATION:  9108                          [facility]   PHYSICIAN:  Osborn Coho, M.D.   DATE OF BIRTH:  1981/05/29   DATE OF PROCEDURE:  04/22/2009  DATE OF DISCHARGE:                               OPERATIVE REPORT   PREOPERATIVE DIAGNOSES:  1. A 37-6/7 weeks.  2. Early labor.  3. Repeat cesarean section.   POSTOPERATIVE DIAGNOSES:  1. A 37-6/7 weeks.  2. Early labor.  3. Repeat cesarean section.   PROCEDURE:  Repeat cesarean section.   ATTENDING DOCTOR:  Osborn Coho, MD   ANESTHESIA:  Spinal.   SPECIMENS TO PATHOLOGY:  Placenta.   FINDINGS:  Live female infant with Apgars of 9 at 1 minute, 9 at 5  minutes, weighing 6 pounds 14 ounces.  A probable small pea-sized  ovarian remnant on the left.   FLUIDS:  2300 mL.   URINE OUTPUT:  150 mL.   ESTIMATED BLOOD LOSS:  700 mL.   COMPLICATIONS:  None.   PROCEDURE:  The patient was taken to the operating room after the risks,  benefits, and alternatives discussed with the patient.  The patient  verbalized understanding and consent signed and witnessed.  The patient  was given a spinal per anesthesia and prepped and draped in the normal  sterile fashion in the supine position.  A Pfannenstiel skin incision  was made and carried down to the underlying layer of fascia with the  scalpel and the Bovie.  The fascia was excised bilaterally in the  midline and extended bilaterally with the Mayo scissors.  Kocher clamps  were placed on the inferior aspect of the fascial incision and the  rectus muscle excised from the fascia.  The same was done on the  superior aspect of the fascial incision.  The muscle was separated in  the midline with a hemostat and bluntly and the peritoneum entered  bluntly and extended manually.  The bladder blade was placed and bladder  flap created with the Metzenbaum scissors.   The uterine incision was  made with the scalpel and extended bilaterally with the bandage  scissors.  The membranes were ruptured and clear fluid noted.  The  infant was delivered in the vertex presentation and the cord was clamped  and cut and the infant handed to the awaiting pediatricians.  Cord  bloods were collected and placenta was removed via fundal massage.  The  uterus was cleared of all clots and debris and the uterine incision was  repaired with 0 Vicryl via a running interlocking stitch and a second  imbricating layer was performed.  Attention was then turned to the left  adnexa, where a small pea-sized probable ovarian remnant was noted.  There was no apparent ovarian tissue on the right side.  Bilateral  fallopian tubes appeared to be within normal limits.  The intra-  abdominal cavity was then copiously irrigated and the uterine incision  was inspected.  There was some bleeding noted in the midline of the  uterine  incision and 3 interrupted stitches were placed with good  hemostasis.  The peritoneum was repaired with 2-0 chromic in a running  fashion and the fascia was repaired with 0 Vicryl in a running fashion.  The subcutaneous tissue was irrigated made hemostatic with the Bovie and  reapproximated using 2 interrupted stitches of 2-0 plain.  The skin was  reapproximated using 3-0 Monocryl.  Half inch Steri-Strips were applied  with Benzoin.  Sponge, lap, and needle count was correct.  The patient  tolerated the procedure well and is currently awaiting transfer to the  recovery room in good condition.      Osborn Coho, M.D.  Electronically Signed     AR/MEDQ  D:  04/22/2009  T:  04/23/2009  Job:  [id number]

## 2011-03-27 NOTE — Discharge Summary (Signed)
[name], Regina Steele             ACCOUNT NO.:  [id number]   MEDICAL RECORD NO.:  [id number]          PATIENT TYPE:  INP   LOCATION:  9108                          FACILITY:  [facility]   PHYSICIAN:  Osborn Coho, M.D.   DATE OF BIRTH:  05/22/1981   DATE OF ADMISSION:  04/22/2009  DATE OF DISCHARGE:  04/24/2009                               DISCHARGE SUMMARY   ATTENDING PHYSICIAN:  Osborn Coho, MD   ADMITTING DIAGNOSES:  1. Intrauterine pregnancy at 37 and 6/7 weeks.  2. Previous cesarean section with desire for repeat.  3. In an early labor.  4. Positive group B strep.   DISCHARGE DIAGNOSES:  1. Intrauterine pregnancy at 37 and 6/7 weeks.  2. Previous cesarean section, desires repeat.  3. Positive group B strep.   PROCEDURES:  1. Repeat low transverse cesarean section.  2. Spinal anesthesia.   HOSPITAL COURSE:  [name] is a 30 year old gravida 3, para 2-0-0-2 at  35 and 6/7 weeks, who presented early in the morning of April 22, 2009,  with uterine contractions.  Cervix on admission was 3, 50% vertex, and -  2.  She was having more pressure versus contractions.  Her pregnancy had  been remarkable for:  1. History of bilateral oophorectomy after her first baby that was      delivered by C-section.  2. Subsequent VBAC, but the patient desired repeat C-section at this      pregnancy.  3. History of depression with the patient currently on Lexapro.  4. The patient on Lexapro at conception.  5. Positive group B strep.  6. Poor pelvic floor integrity requiring enemas for bowel evacuation.   The patient was observed in Maternity Admissions Unit for several hours,  contractions remained somewhat irregular and some more painful cervix  was then 3, 80% vertex, -2.  She did have some occasional mild variables  noted.  The decision was made to proceed with repeat cesarean section.  She has already been scheduled in approximately 1 week.  The patient was  taken to the OR where a  repeat low transverse cesarean section was  performed by Dr. Su Hilt under spinal anesthesia.   Findings were a viable female, weight 6 pounds 14 ounces by the name of  Regina Steele, Apgars were 9 and 9.  The patient tolerated the procedure well  and was taken to recovery in good condition.  Infant was taken to the  recovery in good condition.  There was a small ovarian remnant noted on  the left side.  No ovarian tissue was able to be appreciated on the  right side.  By postop day #1, the patient doing well.  She was up ad  lib.  She declined the day one lab evaluation.  Her hemoglobin  preoperatively had been 12.6.  She again declined this on postop day #2.  Her bleeding was minimal.  Her incision was clean, dry, and intact.  She  was planning to use Mirena for birth control.  She was working on breast-  feeding.  Her vital signs were stable.  She was afebrile.  She was  using  Motrin and Percocet for pain with good relief.  She was requesting early  discharge.  She was deemed to receive full benefit of her hospital stay  and was discharged home in stable condition.   DISCHARGE MEDICATIONS:  1. Motrin 600 mg p.o. q.6 h. p.r.n. pain.  2. Percocet 5/325 one to two p.o. daily 3-4 hours p.r.n. pain.   DISCHARGE FOLLOWUP:  Occur in 4-6 weeks per [facility].   DISCHARGE INSTRUCTIONS:  Per [facility] handout.  The patient  also monitor for any signs and symptoms of postpartum impression.      Regina Steele, C.N.M.      Osborn Coho, M.D.  Electronically Signed    VLL/MEDQ  D:  04/24/2009  T:  04/24/2009  Job:  [id number]

## 2011-03-27 NOTE — Discharge Summary (Signed)
Regina Steele, Regina Steele             ACCOUNT NO.:  [id number]   MEDICAL RECORD NO.:  [id number]          PATIENT TYPE:  INP   LOCATION:  9108                          [facility]  [facility]   PHYSICIAN:  Crist Fat. Rivard, M.D. DATE OF BIRTH:  Apr 19, 1981   DATE OF ADMISSION:  05/19/2007  DATE OF DISCHARGE:  05/21/2007                               DISCHARGE SUMMARY   ADMISSION DIAGNOSES:  1. Intrauterine pregnancy at term.  2. Active labor.  3. Group-B Streptococcus positive.  4. Previous cesarean section, desires vaginal birth after cesarean.   DISCHARGE DIAGNOSES:  1. Intrauterine pregnancy at term.  2. Active labor.  3. Group-B Streptococcus positive.  4. Previous cesarean section, desires vaginal birth after cesarean.  5. Non-reassuring fetal heart rate.   PROCEDURE:  Vaginal delivery with vacuum extraction.   HOSPITAL COURSE:  Regina Steele is a 30 year old single white female,  gravida 2, para 1, 0, 0, 1, who was admitted at 39-4/7th weeks with  regular uterine contractions.  Her pregnancy has been followed by the  [facility] OB/GYN M.D. Service and has been remarkable for a  history of bilateral oophorectomy, previous C-section who desires VBAC  and depression, on Lexapro, migraines and Group-B Streptococcus  positive.   HOSPITAL COURSE:  Upon admission the patient was 5 to 6 cm dilated, 90%  effaced, vertex -1 with intact membranes.  She declined pain medication  at present.  She became completely dilated at 12:58 p.m. and was noted  to have clear fluid.  At that point the fetal heart rate went down to  the 80's to 90's and was sustained, despite scalp stimulation and  position changes.  The baby was in the OA position at +3 station.  The  patient was consented for a vacuum assistance.  A mushroom vacuum was  applied.  Over one contraction the infant was born.  She is a viable  female named Daniella, born at 1:07 p.m., with a loose nuchal cord that  was reduced on the  perineum.  Apgars were 9 and 9.  Weight was 7 pounds  and 6 ounces.  She had a second-degree laceration that was repaired with  #3-0 Vicryl.  The patient tolerated the procedure well.  The infant was  taken to the full-term nursery in good condition.   By postpartum day number one the patient was continuing to do well.  Her  vital signs were stable.  She was afebrile.  Hemoglobin was 10.6.  It  had been 11.9 preoperatively.  She was breast feeding.  By postpartum  day number two, she continued to do well.  She was deemed to have  received the full benefit of her hospital stay and she was discharged  home.   DISCHARGE INSTRUCTIONS:  Per the [facility] OB/GYN hand-out.   DISCHARGE MEDICATIONS:  1. Motrin 600 mg, one p.o. q.6h. p.r.n. pain.  2. Prenatal vitamin, one p.o. q.d.   FOLLOWUP:  Will occur at [facility] in four to six weeks,  or as needed.      Cam Hai, C.N.M.      Crist Fat  Rivard, M.D.  Electronically Signed    KS/MEDQ  D:  05/21/2007  T:  05/21/2007  Job:  [id number]
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
## 2011-06-20 ENCOUNTER — Emergency Department (HOSPITAL_COMMUNITY)
Admission: EM | Admit: 2011-06-20 | Discharge: 2011-06-21 | Disposition: A | Discharge status: Home / Self Care | Payer: Self-pay | Attending: Emergency Medicine | Admitting: Emergency Medicine

## 2011-06-20 DIAGNOSIS — F329 Major depressive disorder, single episode, unspecified: Secondary | ICD-10-CM | POA: Insufficient documentation

## 2011-06-20 DIAGNOSIS — Z79899 Other long term (current) drug therapy: Secondary | ICD-10-CM | POA: Insufficient documentation

## 2011-06-20 DIAGNOSIS — R45851 Suicidal ideations: Secondary | ICD-10-CM | POA: Insufficient documentation

## 2011-06-20 DIAGNOSIS — F3289 Other specified depressive episodes: Secondary | ICD-10-CM | POA: Insufficient documentation

## 2011-06-20 LAB — URINALYSIS, ROUTINE W REFLEX MICROSCOPIC
Glucose, UA: NEGATIVE mg/dL
Leukocytes, UA: NEGATIVE
Nitrite: NEGATIVE
Protein, ur: NEGATIVE mg/dL
pH: 6 (ref 5.0–8.0)

## 2011-06-20 LAB — COMPREHENSIVE METABOLIC PANEL
ALT: 19 U/L (ref 0–35)
AST: 24 U/L (ref 0–37)
Albumin: 4.3 g/dL (ref 3.5–5.2)
Calcium: 9.5 mg/dL (ref 8.4–10.5)
Sodium: 136 mEq/L (ref 135–145)
Total Protein: 7.8 g/dL (ref 6.0–8.3)

## 2011-06-20 LAB — DIFFERENTIAL
Basophils Relative: 1 % (ref 0–1)
Eosinophils Absolute: 0.2 10*3/uL (ref 0.0–0.7)
Eosinophils Relative: 3 % (ref 0–5)
Monocytes Absolute: 0.3 10*3/uL (ref 0.1–1.0)
Monocytes Relative: 5 % (ref 3–12)
Neutrophils Relative %: 63 % (ref 43–77)

## 2011-06-20 LAB — CBC
MCH: 29.3 pg (ref 26.0–34.0)
MCHC: 32.8 g/dL (ref 30.0–36.0)
Platelets: 235 10*3/uL (ref 150–400)
RBC: 4.33 MIL/uL (ref 3.87–5.11)
RDW: 12.6 % (ref 11.5–15.5)

## 2011-06-20 LAB — POCT PREGNANCY, URINE: Preg Test, Ur: NEGATIVE

## 2011-06-20 LAB — RAPID URINE DRUG SCREEN, HOSP PERFORMED
Amphetamines: NOT DETECTED
Benzodiazepines: NOT DETECTED
Opiates: NOT DETECTED

## 2011-08-28 LAB — CBC
HCT: 30.9 — ABNORMAL LOW
HCT: 35.1 — ABNORMAL LOW
Hemoglobin: 10.6 — ABNORMAL LOW
MCHC: 34
MCHC: 34.4
MCV: 86.7
Platelets: 223
RBC: 4.04
RDW: 13.4

## 2011-08-28 LAB — RPR: RPR Ser Ql: NONREACTIVE

## 2011-09-15 ENCOUNTER — Emergency Department (HOSPITAL_COMMUNITY)
Admission: EM | Admit: 2011-09-15 | Discharge: 2011-09-16 | Disposition: A | Discharge status: Home / Self Care | Payer: Medicaid Other | Attending: Emergency Medicine | Admitting: Emergency Medicine

## 2011-09-15 DIAGNOSIS — J3489 Other specified disorders of nose and nasal sinuses: Secondary | ICD-10-CM | POA: Insufficient documentation

## 2011-09-15 DIAGNOSIS — R0982 Postnasal drip: Secondary | ICD-10-CM | POA: Insufficient documentation

## 2011-09-15 DIAGNOSIS — J329 Chronic sinusitis, unspecified: Secondary | ICD-10-CM | POA: Insufficient documentation

## 2011-09-15 DIAGNOSIS — R599 Enlarged lymph nodes, unspecified: Secondary | ICD-10-CM | POA: Insufficient documentation

## 2011-09-15 DIAGNOSIS — F329 Major depressive disorder, single episode, unspecified: Secondary | ICD-10-CM | POA: Insufficient documentation

## 2011-09-15 DIAGNOSIS — F3289 Other specified depressive episodes: Secondary | ICD-10-CM | POA: Insufficient documentation

## 2011-09-15 DIAGNOSIS — R51 Headache: Secondary | ICD-10-CM | POA: Insufficient documentation

## 2011-09-15 DIAGNOSIS — Z79899 Other long term (current) drug therapy: Secondary | ICD-10-CM | POA: Insufficient documentation

## 2011-11-13 DIAGNOSIS — N814 Uterovaginal prolapse, unspecified: Secondary | ICD-10-CM

## 2011-11-13 HISTORY — DX: Uterovaginal prolapse, unspecified: N81.4

## 2011-12-25 ENCOUNTER — Emergency Department (HOSPITAL_COMMUNITY)
Admission: EM | Admit: 2011-12-25 | Discharge: 2011-12-25 | Disposition: A | Discharge status: Home / Self Care | Payer: Medicaid Other | Attending: Emergency Medicine | Admitting: Emergency Medicine

## 2011-12-25 ENCOUNTER — Encounter (HOSPITAL_COMMUNITY): Payer: Self-pay | Admitting: *Deleted

## 2011-12-25 DIAGNOSIS — M26609 Unspecified temporomandibular joint disorder, unspecified side: Secondary | ICD-10-CM | POA: Insufficient documentation

## 2011-12-25 DIAGNOSIS — R599 Enlarged lymph nodes, unspecified: Secondary | ICD-10-CM | POA: Insufficient documentation

## 2011-12-25 DIAGNOSIS — R6884 Jaw pain: Secondary | ICD-10-CM | POA: Insufficient documentation

## 2011-12-25 MED ORDER — NAPROXEN 500 MG PO TABS: 500.0000 mg | ORAL_TABLET | Freq: Two times a day (BID) | ORAL | Status: DC

## 2011-12-25 MED ORDER — CYCLOBENZAPRINE HCL 10 MG PO TABS: 10.0000 mg | ORAL_TABLET | Freq: Two times a day (BID) | ORAL | Status: AC | PRN

## 2011-12-25 MED ORDER — ACETAMINOPHEN-CODEINE #3 300-30 MG PO TABS: 1.0000 | ORAL_TABLET | Freq: Four times a day (QID) | ORAL | Status: AC | PRN

## 2011-12-25 NOTE — ED Notes (Signed)
Jaw pain for 2 months and she wants med to help her get rest

## 2011-12-25 NOTE — ED Notes (Signed)
Complaining of jaw pain for two months intermittently.  States pain begins between 1500-1800 usually after getting off work. Has taken ibuprofen and motrin with no relief. Rates pain as 5/10

## 2011-12-25 NOTE — Discharge Instructions (Signed)
Temporomandibular Problems   Temporomandibular joint (TMJ) dysfunction means there are problems with the joint between your jaw and your skull. This is a joint lined by cartilage like other joints in your body but also has a small disc in the joint which keeps the bones from rubbing on each other. These joints are like other joints and can get inflamed (sore) from arthritis and other problems. When this joint gets sore, it can cause headaches and pain in the jaw and the face.  CAUSES   Usually the arthritic types of problems are caused by soreness in the joint. Soreness in the joint can also be caused by overuse. This may come from grinding your teeth. It may also come from mis-alignment in the joint.  DIAGNOSIS  Diagnosis of this condition can often be made by history and exam. Sometimes your caregiver may need X-rays or an MRI scan to determine the exact cause. It may be necessary to see your dentist to determine if your teeth and jaws are lined up correctly.  TREATMENT   Most of the time this problem is not serious; however, sometimes it can persist (become chronic). When this happens medications that will cut down on inflammation (soreness) help. Sometimes a shot of cortisone into the joint will be helpful. If your teeth are not aligned it may help for your dentist to make a splint for your mouth that can help this problem. If no physical problems can be found, the problem may come from tension. If tension is found to be the cause, biofeedback or relaxation techniques may be helpful.  HOME CARE INSTRUCTIONS   · Later in the day, applications of ice packs may be helpful. Ice can be used in a plastic bag with a towel around it to prevent frostbite to skin. This may be used about every 2 hours for 20 to 30 minutes, as needed while awake, or as directed by your caregiver.   · Only take over-the-counter or prescription medicines for pain, discomfort, or fever as directed by your caregiver.   · If physical therapy was  prescribed, follow your caregiver's directions.   · Wear mouth appliances as directed if they were given.   Document Released: 07/24/2001 Document Revised: 07/11/2011 Document Reviewed: 10/31/2008  [employer]® Patient Information ©2012 [employer].

## 2011-12-25 NOTE — ED Provider Notes (Signed)
History     CSN: [id number]  Arrival date & time 12/25/11  2100   First MD Initiated Contact with Patient 12/25/11 2203      Chief Complaint  Patient presents with  . Jaw Pain    (Consider location/radiation/quality/duration/timing/severity/associated sxs/prior treatment) Patient is a 31 y.o. female presenting with tooth pain.  Dental PainThe primary symptoms include mouth pain. The symptoms began more than 1 month ago. The symptoms are worsening. The symptoms occur frequently.  Additional symptoms include: jaw pain and swollen glands.  Bilateral jaw pain at TMJ.  History reviewed. No pertinent past medical history.  History reviewed. No pertinent past surgical history.  History reviewed. No pertinent family history.  History  Substance Use Topics  . Smoking status: Never Smoker   . Smokeless tobacco: Not on file  . Alcohol Use: No    OB History    Grav Para Term Preterm Abortions TAB SAB Ect Mult Living                  Review of Systems  All other systems reviewed and are negative.    Allergies  Review of patient's allergies indicates no known allergies.  Home Medications   Current Outpatient Rx  Name Route Sig Dispense Refill  . ALPRAZOLAM 1 MG PO TABS Oral Take 1 mg by mouth 4 (four) times daily as needed. For anxiety    . DOCUSATE SODIUM 100 MG PO CAPS Oral Take 400 mg by mouth daily.    [name] ESCITALOPRAM OXALATE 20 MG PO TABS Oral Take 20 mg by mouth daily.    [name] ESTRADIOL 2 MG PO TABS Oral Take 2 mg by mouth daily.    [name] ESTRADIOL 25 MCG VA TABS Vaginal Place 25 mcg vaginally 2 (two) times a week.    [name] RISPERIDONE 1 MG PO TABS Oral Take 1 mg by mouth daily.    . TOPIRAMATE 200 MG PO TABS Oral Take 200 mg by mouth daily.    [name] ZOLPIDEM TARTRATE 5 MG PO TABS Oral Take 10 mg by mouth at bedtime as needed.      BP 100/65  Pulse 70  Temp(Src) 98.2 F (36.8 C) (Oral)  Resp 18  SpO2 99%  Physical Exam  Nursing note and vitals reviewed. Constitutional:  She is oriented to person, place, and time. She appears well-developed and well-nourished.  HENT:  Head: Normocephalic.    Eyes: Conjunctivae are normal. Pupils are equal, round, and reactive to light.  Neck: Normal range of motion.  Cardiovascular: Normal rate, regular rhythm and normal heart sounds.   Pulmonary/Chest: Effort normal and breath sounds normal.  Abdominal: Soft. Bowel sounds are normal.  Musculoskeletal: Normal range of motion.  Lymphadenopathy:    She has cervical adenopathy.  Neurological: She is alert and oriented to person, place, and time.  Skin: Skin is warm and dry.  Psychiatric: She has a normal mood and affect.    ED Course  Procedures (including critical care time)  Labs Reviewed - No data to display No results found.   No diagnosis found.   TMJ disorder MDM          Jimmye Norman, NP 12/25/11 2214

## 2011-12-29 NOTE — ED Provider Notes (Signed)
Evaluation and management procedures were performed by the PA/NP under my supervision/collaboration.   Felisa Bonier, MD 12/29/11 [phone]

## 2012-01-08 ENCOUNTER — Encounter (INDEPENDENT_AMBULATORY_CARE_PROVIDER_SITE_OTHER): Payer: Medicaid Other | Admitting: Obstetrics and Gynecology

## 2012-01-08 DIAGNOSIS — N8189 Other female genital prolapse: Secondary | ICD-10-CM

## 2012-01-08 DIAGNOSIS — N949 Unspecified condition associated with female genital organs and menstrual cycle: Secondary | ICD-10-CM

## 2012-03-07 ENCOUNTER — Emergency Department (HOSPITAL_COMMUNITY): Payer: Medicaid Other

## 2012-03-07 ENCOUNTER — Encounter (HOSPITAL_COMMUNITY): Payer: Self-pay | Admitting: *Deleted

## 2012-03-07 ENCOUNTER — Emergency Department (HOSPITAL_COMMUNITY)
Admission: EM | Admit: 2012-03-07 | Discharge: 2012-03-07 | Disposition: A | Discharge status: Home / Self Care | Payer: Medicaid Other | Attending: Emergency Medicine | Admitting: Emergency Medicine

## 2012-03-07 DIAGNOSIS — R109 Unspecified abdominal pain: Secondary | ICD-10-CM | POA: Insufficient documentation

## 2012-03-07 DIAGNOSIS — K5900 Constipation, unspecified: Secondary | ICD-10-CM | POA: Insufficient documentation

## 2012-03-07 LAB — COMPREHENSIVE METABOLIC PANEL
ALT: 11 U/L (ref 0–35)
Albumin: 3.9 g/dL (ref 3.5–5.2)
Alkaline Phosphatase: 33 U/L — ABNORMAL LOW (ref 39–117)
BUN: 16 mg/dL (ref 6–23)
Chloride: 105 mEq/L (ref 96–112)
Glucose, Bld: 90 mg/dL (ref 70–99)
Potassium: 3.5 mEq/L (ref 3.5–5.1)
Sodium: 137 mEq/L (ref 135–145)
Total Bilirubin: 0.1 mg/dL — ABNORMAL LOW (ref 0.3–1.2)

## 2012-03-07 LAB — URINALYSIS, ROUTINE W REFLEX MICROSCOPIC
Bilirubin Urine: NEGATIVE
Glucose, UA: NEGATIVE mg/dL
Ketones, ur: NEGATIVE mg/dL
Leukocytes, UA: NEGATIVE
Protein, ur: NEGATIVE mg/dL

## 2012-03-07 LAB — CBC
Hemoglobin: 12.8 g/dL (ref 12.0–15.0)
MCH: 30.3 pg (ref 26.0–34.0)
MCHC: 34.1 g/dL (ref 30.0–36.0)
Platelets: 242 10*3/uL (ref 150–400)
RDW: 12.1 % (ref 11.5–15.5)

## 2012-03-07 LAB — DIFFERENTIAL
Basophils Absolute: 0 10*3/uL (ref 0.0–0.1)
Basophils Relative: 1 % (ref 0–1)
Eosinophils Absolute: 0.2 10*3/uL (ref 0.0–0.7)
Monocytes Relative: 5 % (ref 3–12)
Neutro Abs: 3.9 10*3/uL (ref 1.7–7.7)
Neutrophils Relative %: 60 % (ref 43–77)

## 2012-03-07 MED ORDER — LACTULOSE 10 GM/15ML PO SOLN: 20.0000 g | Freq: Two times a day (BID) | ORAL | Status: AC | PRN

## 2012-03-07 NOTE — ED Provider Notes (Signed)
History     CSN: [id number]  Arrival date & time 03/07/12  1558   First MD Initiated Contact with Patient 03/07/12 1944      Chief Complaint  Patient presents with  . constipated     (Consider location/radiation/quality/duration/timing/severity/associated sxs/prior treatment) HPI Comments: Regina Steele is a 31 y.o. Female with constipation that is worsening this week and has not improved despite using magnesium citrate twice, and an enema today. She denies fever, nausea, vomiting, chest pain, weakness, dizziness, urinary symptoms or back pain. She feels like she is bloating. She has chronic and recurrent constipation. She does not see a GI doctor regularly at this time. She feels like this bout was caused by [place] that she took earlier this month.  The history is provided by the patient.    History reviewed. No pertinent past medical history.  History reviewed. No pertinent past surgical history.  No family history on file.  History  Substance Use Topics  . Smoking status: Never Smoker   . Smokeless tobacco: Not on file  . Alcohol Use: No    OB History    Grav Para Term Preterm Abortions TAB SAB Ect Mult Living                  Review of Systems  All other systems reviewed and are negative.    Allergies  Review of patient's allergies indicates no known allergies.  Home Medications   Current Outpatient Rx  Name Route Sig Dispense Refill  . ALPRAZOLAM 1 MG PO TABS Oral Take 1 mg by mouth 4 (four) times daily as needed. For anxiety    . DOCUSATE SODIUM 100 MG PO CAPS Oral Take 400-700 mg by mouth daily.     [name] ESCITALOPRAM OXALATE 20 MG PO TABS Oral Take 20 mg by mouth at bedtime.     [name] ESTRADIOL 2 MG PO TABS Oral Take 2 mg by mouth at bedtime.     [name] ESTRADIOL 25 MCG VA TABS Vaginal Place 25 mcg vaginally 2 (two) times a week.    [name] NAPROXEN 500 MG PO TABS Oral Take 500 mg by mouth 2 (two) times daily as needed. For pain    . PALIPERIDONE ER 1.5 MG PO TB24  Oral Take 1 tablet by mouth 2 (two) times daily.    [name] RISPERIDONE 1 MG PO TABS Oral Take 1 mg by mouth 2 (two) times daily.     . TOPIRAMATE 100 MG PO TABS Oral Take 100 mg by mouth 2 (two) times daily.    [name] ZOLPIDEM TARTRATE 10 MG PO TABS Oral Take 10 mg by mouth at bedtime as needed. For insomnia    . LACTULOSE 10 GM/15ML PO SOLN Oral Take 30 mLs (20 g total) by mouth 2 (two) times daily as needed (constipation). 960 mL 0    BP 109/75  Pulse 55  Temp(Src) 98.1 F (36.7 C) (Oral)  Resp 16  SpO2 99%  Physical Exam  Nursing note and vitals reviewed. Constitutional: She is oriented to person, place, and time. She appears well-developed and well-nourished.  HENT:  Head: Normocephalic and atraumatic.  Eyes: Conjunctivae and EOM are normal. Pupils are equal, round, and reactive to light.  Neck: Normal range of motion and phonation normal. Neck supple.  Cardiovascular: Normal rate, regular rhythm and intact distal pulses.   Pulmonary/Chest: Effort normal and breath sounds normal. She exhibits no tenderness.  Abdominal: Soft. She exhibits no distension. There is no tenderness. There is  no guarding.  Genitourinary:       Rectal vault empty, no impaction or stool on finger.  Musculoskeletal: Normal range of motion.  Neurological: She is alert and oriented to person, place, and time. She has normal strength. She exhibits normal muscle tone.  Skin: Skin is warm and dry.  Psychiatric: Judgment and thought content normal.       Anxious, pressured speech at times.    ED Course  Procedures (including critical care time)  Labs Reviewed  COMPREHENSIVE METABOLIC PANEL - Abnormal; Notable for the following:    Alkaline Phosphatase 33 (*)    Total Bilirubin 0.1 (*)    All other components within normal limits  CBC  DIFFERENTIAL  URINALYSIS, ROUTINE W REFLEX MICROSCOPIC  PREGNANCY, URINE  LIPASE, BLOOD   Dg Abd Acute W/chest  03/07/2012  *RADIOLOGY REPORT*  Clinical Data: Abdominal pain  with constipation for 3 weeks.  ACUTE ABDOMEN SERIES (ABDOMEN 2 VIEW & CHEST 1 VIEW)  Comparison: None.  Findings: The heart size and mediastinal contours are normal.  The lungs are clear and there is no pleural effusion or pneumothorax.  Moderate stool is present within the proximal colon.  There is no evidence of bowel obstruction or pneumoperitoneum.  Intrauterine device is noted.  There is a small left pelvic calcification which is likely a phlebolith.  There is a mild thoracolumbar scoliosis. No acute osseous findings are seen.  IMPRESSION:  1.  Prominent stool in the right colon consistent with constipation. 2.  No acute cardiopulmonary or abdominal process identified. 3.  Thoracolumbar scoliosis.  Original Report Authenticated By: Gerrianne Scale, M.D.     1. Constipation       MDM  Recurrent constipation. Doubt systemic illness, metabolic instability or bowel obstruction.   Plan: Home Medications- Rx Lactulose; Home Treatments- warm barhes; Recommended follow up- f/u PCP in 3-4 days        Flint Melter, MD 03/07/12 2012

## 2012-03-07 NOTE — ED Notes (Signed)
Pt states, "I have been taking Invega 1.5mg  twice a day from 4/5-4/25. I looked up the side effects & constipation is one of them."

## 2012-03-07 NOTE — Discharge Instructions (Signed)
See your Dr. for a checkup as soon as possible   Constipation in Adults Constipation is having fewer than 2 bowel movements per week. Usually, the stools are hard. As we grow older, constipation is more common. If you try to fix constipation with laxatives, the problem may get worse. This is because laxatives taken over a long period of time make the colon muscles weaker. A low-fiber diet, not taking in enough fluids, and taking some medicines may make these problems worse. MEDICATIONS THAT MAY CAUSE CONSTIPATION  Water pills (diuretics).   Calcium channel blockers (used to control blood pressure and for the heart).   Certain pain medicines (narcotics).   Anticholinergics.   Anti-inflammatory agents.   Antacids that contain aluminum.  DISEASES THAT CONTRIBUTE TO CONSTIPATION  Diabetes.   Parkinson's disease.   Dementia.   Stroke.   Depression.   Illnesses that cause problems with salt and water metabolism.  HOME CARE INSTRUCTIONS   Constipation is usually best cared for without medicines. Increasing dietary fiber and eating more fruits and vegetables is the best way to manage constipation.   Slowly increase fiber intake to 25 to 38 grams per day. Whole grains, fruits, vegetables, and legumes are good sources of fiber. A dietitian can further help you incorporate high-fiber foods into your diet.   Drink enough water and fluids to keep your urine clear or pale yellow.   A fiber supplement may be added to your diet if you cannot get enough fiber from foods.   Increasing your activities also helps improve regularity.   Suppositories, as suggested by your caregiver, will also help. If you are using antacids, such as aluminum or calcium containing products, it will be helpful to switch to products containing magnesium if your caregiver says it is okay.   If you have been given a liquid injection (enema) today, this is only a temporary measure. It should not be relied on for  treatment of longstanding (chronic) constipation.   Stronger measures, such as magnesium sulfate, should be avoided if possible. This may cause uncontrollable diarrhea. Using magnesium sulfate may not allow you time to make it to the bathroom.  SEEK IMMEDIATE MEDICAL CARE IF:   There is bright red blood in the stool.   The constipation stays for more than 4 days.   There is belly (abdominal) or rectal pain.   You do not seem to be getting better.   You have any questions or concerns.  MAKE SURE YOU:   Understand these instructions.   Will watch your condition.   Will get help right away if you are not doing well or get worse.  Document Released: 07/27/2004 Document Revised: 10/18/2011 Document Reviewed: 10/02/2011 [facility] Patient Information 2012 [place], [place].

## 2012-03-07 NOTE — ED Notes (Signed)
Discharge instructions reviewed with pt; verbalizes understanding.  No questions asked; no further c/o's voiced.  Pt ambulatory to lobby.  NAD noted. 

## 2012-03-07 NOTE — ED Notes (Signed)
The pt has had constipation for 3 weeks and she has used numerus meds to break up the  Constipation that have not helped

## 2012-03-12 ENCOUNTER — Encounter (HOSPITAL_COMMUNITY): Payer: Self-pay | Admitting: Emergency Medicine

## 2012-03-12 ENCOUNTER — Emergency Department (HOSPITAL_COMMUNITY)
Admission: EM | Admit: 2012-03-12 | Discharge: 2012-03-12 | Disposition: A | Discharge status: Home / Self Care | Payer: Medicaid Other | Attending: Emergency Medicine | Admitting: Emergency Medicine

## 2012-03-12 DIAGNOSIS — IMO0001 Reserved for inherently not codable concepts without codable children: Secondary | ICD-10-CM | POA: Insufficient documentation

## 2012-03-12 DIAGNOSIS — R42 Dizziness and giddiness: Secondary | ICD-10-CM | POA: Insufficient documentation

## 2012-03-12 DIAGNOSIS — J3489 Other specified disorders of nose and nasal sinuses: Secondary | ICD-10-CM | POA: Insufficient documentation

## 2012-03-12 DIAGNOSIS — J029 Acute pharyngitis, unspecified: Secondary | ICD-10-CM | POA: Insufficient documentation

## 2012-03-12 DIAGNOSIS — R51 Headache: Secondary | ICD-10-CM | POA: Insufficient documentation

## 2012-03-12 DIAGNOSIS — J019 Acute sinusitis, unspecified: Secondary | ICD-10-CM | POA: Insufficient documentation

## 2012-03-12 HISTORY — DX: Depression, unspecified: F32.A

## 2012-03-12 HISTORY — DX: Major depressive disorder, single episode, unspecified: F32.9

## 2012-03-12 HISTORY — DX: Post-traumatic stress disorder, unspecified: F43.10

## 2012-03-12 MED ORDER — PSEUDOEPHEDRINE HCL 60 MG PO TABS
30.0000 mg | ORAL_TABLET | Freq: Once | ORAL | Status: AC
  Administered 2012-03-12: 30 mg via ORAL
  Filled 2012-03-12: qty 1

## 2012-03-12 MED ORDER — MECLIZINE HCL 25 MG PO TABS
25.0000 mg | ORAL_TABLET | Freq: Once | ORAL | Status: AC
  Administered 2012-03-12: 25 mg via ORAL
  Filled 2012-03-12: qty 1

## 2012-03-12 MED ORDER — MECLIZINE HCL 50 MG PO TABS: 50.0000 mg | ORAL_TABLET | Freq: Three times a day (TID) | ORAL | Status: AC | PRN

## 2012-03-12 MED ORDER — AMOXICILLIN-POT CLAVULANATE 875-125 MG PO TABS: 1.0000 | ORAL_TABLET | Freq: Two times a day (BID) | ORAL | Status: AC

## 2012-03-12 NOTE — ED Notes (Signed)
Pt report 2 week hx of weakness, headache and generalized discomfort

## 2012-03-12 NOTE — ED Provider Notes (Signed)
History     CSN: [id number]  Arrival date & time 03/12/12  1222   First MD Initiated Contact with Patient 03/12/12 1223      Chief Complaint  Patient presents with  . Headache  . Facial Pain    sinus pressure  . Generalized Body Aches    10 day hx of general body aches and fatigue  . Dizziness    (Consider location/radiation/quality/duration/timing/severity/associated sxs/prior treatment) HPI History provided by pt.   Pt believes she has a sinus infection.  Has been experiencing facial pressure, frontal headache, nasal congestion, rhinorrhea and mild sore throat for the past 7 days.  Has been taking motrin and aleve for pain but unsure of whether or not it has given her any relief.  Has also been taking flonase which has improved her congestion.  Sx associated w/ room-spinning dizziness that started 3-4 days ago and is aggravated by turning her head and driving.  Denies fever, cough, vision changes, ataxia, N/V, ear pain, hearing impairment and tinnitus.  No recent head trauma.      Past Medical History  Diagnosis Date  . Depression   . PTSD (post-traumatic stress disorder)     Past Surgical History  Procedure Date  . Oophorectomy     bilat  . Rectocele repair   . Cesarean section     History reviewed. No pertinent family history.  History  Substance Use Topics  . Smoking status: Never Smoker   . Smokeless tobacco: Not on file  . Alcohol Use: No    OB History    Grav Para Term Preterm Abortions TAB SAB Ect Mult Living                  Review of Systems  All other systems reviewed and are negative.    Allergies  Review of patient's allergies indicates no known allergies.  Home Medications   Current Outpatient Rx  Name Route Sig Dispense Refill  . ALPRAZOLAM 1 MG PO TABS Oral Take 1 mg by mouth 4 (four) times daily as needed. For anxiety    . DOCUSATE SODIUM 100 MG PO CAPS Oral Take 400-700 mg by mouth daily.     [name] ESCITALOPRAM OXALATE 20 MG PO TABS Oral  Take 20 mg by mouth at bedtime.     [name] ESTRADIOL 2 MG PO TABS Oral Take 2 mg by mouth at bedtime.     [name] ESTRADIOL 25 MCG VA TABS Vaginal Place 25 mcg vaginally 2 (two) times a week.    [name] LACTULOSE 10 GM/15ML PO SOLN Oral Take 30 mLs (20 g total) by mouth 2 (two) times daily as needed (constipation). 960 mL 0  . NAPROXEN 500 MG PO TABS Oral Take 500 mg by mouth 2 (two) times daily as needed. For pain    . PALIPERIDONE ER 1.5 MG PO TB24 Oral Take 1 tablet by mouth 2 (two) times daily.    [name] RISPERIDONE 1 MG PO TABS Oral Take 1 mg by mouth 2 (two) times daily.     . TOPIRAMATE 100 MG PO TABS Oral Take 100 mg by mouth 2 (two) times daily.    [name] ZOLPIDEM TARTRATE 10 MG PO TABS Oral Take 10 mg by mouth at bedtime as needed. For insomnia      BP 111/79  Pulse 80  Temp(Src) 97.8 F (36.6 C) (Oral)  Resp 18  SpO2 99%  Physical Exam  Nursing note and vitals reviewed. Constitutional: She is oriented to person,  place, and time. She appears well-developed and well-nourished. No distress.  HENT:  Head: Normocephalic and atraumatic. No trismus in the jaw.  Right Ear: Tympanic membrane and ear canal normal.  Left Ear: Tympanic membrane and ear canal normal.  Mouth/Throat: Uvula is midline and mucous membranes are normal. No posterior oropharyngeal edema or posterior oropharyngeal erythema.       Left and right TM appear nml and external canal w/out edema, erythema or drainage.  Oropharynx clear.  No erythema of posterior pharynx. Tonsils symmetric and w/out edema/exudate.  Uvula mid-line.  No trismus.  No nasal discharge.  Mild tenderness center of forehead as well as bilateral maxillary sinuses.   Eyes:       Normal appearance  Neck: Normal range of motion. Neck supple.       Bilateral posterior cervical lymphadenopathy  Cardiovascular: Normal rate and regular rhythm.   Pulmonary/Chest: Effort normal and breath sounds normal. No respiratory distress.  Musculoskeletal: Normal range of motion.    Neurological: She is alert and oriented to person, place, and time.       CN 3-12 intact.  5/5 and equal upper and lower extremity strength.  No sensory deficits.  No past pointing.  Nml gait.  No pronator drift.  Neg romberg.  No nystagmus.  Skin: Skin is warm and dry. No rash noted.  Psychiatric: She has a normal mood and affect. Her behavior is normal.    ED Course  Procedures (including critical care time)  Labs Reviewed - No data to display No results found.   1. Sinusitis acute   2. Vertigo       MDM  Healthy 31yo F presents w/ c/o sinus infection.  C/o facial pain, nasal congestion and rhinorrhea x 1 wk w/ associated room-spinning dizziness that is aggravated by head movement.  No other neurologic complaints.  Exam sig for no fever, sinus tenderness and nml neurologic exam, including nml gait. Suspect that dizziness is secondary to acute sinusitis.  No recent head trauma. S/sx are not consistent w/ vestibular neuronitis or labyrinthitis.  Pt received sudafed and meclizine in ED and sx improved.  She is moving her head w/ ease.  D/c'd home w/ augmentin (delayed abx therapy; discussed w/ pt at length) and meclizine.  Recommended that she take OTC sudafed bid, meclizine if no relief of dizziness w/ sudafed, and return to ER if headache/dizziness worsen.  She has a PCP to f/u with.        Arie Sabina Arie Powell, PA 03/12/12 1350

## 2012-03-12 NOTE — ED Notes (Signed)
Neuro assessment completed by PA.No abnormalities noted

## 2012-03-12 NOTE — Discharge Instructions (Signed)
Take sudafed twice a day for facial pressure, nasal congestion and dizziness.  Take meclizine if no relief of dizziness with the sudafed.  Continue you flonase as well as motrin/aleve.  Hold onto antibiotic for 2 days and start only if your symptoms have not improved with recommended medications.  Follow up with your primary care doctor next week if your symptoms persist.  You should return to the ER if you have worsening headache or your dizziness is not controlled w/ treatment of sinusitis nor the meclizine.

## 2012-03-13 NOTE — ED Provider Notes (Signed)
Medical screening examination/treatment/procedure(s) were performed by non-physician practitioner and as supervising physician I was immediately available for consultation/collaboration.  Gerhard Munch, MD 03/13/12 [phone]

## 2012-03-19 ENCOUNTER — Encounter: Payer: Self-pay | Admitting: Obstetrics and Gynecology

## 2012-03-19 ENCOUNTER — Ambulatory Visit (INDEPENDENT_AMBULATORY_CARE_PROVIDER_SITE_OTHER): Payer: Medicare Other | Admitting: Obstetrics and Gynecology

## 2012-03-19 VITALS — BP 94/58 | Wt >= 6400 oz

## 2012-03-19 DIAGNOSIS — N814 Uterovaginal prolapse, unspecified: Secondary | ICD-10-CM

## 2012-03-19 NOTE — Progress Notes (Signed)
S: 31 yo patient with uterine prolapse. Trial of pessary unsatisfactory. Difficulty inserting  O: with mirror visualization, pessary teaching achieved. Pt left alone for removal and reinsertion and now feels comfortable.      Pelvic exam unchanged with uterine prolapse 2/4  A: pelvic prolapse  P: continue trial of pessary      Follow-up PRN

## 2012-03-24 ENCOUNTER — Encounter: Payer: Self-pay | Admitting: *Deleted

## 2012-03-28 ENCOUNTER — Telehealth: Payer: Self-pay | Admitting: Obstetrics and Gynecology

## 2012-03-31 NOTE — Telephone Encounter (Unsigned)
Wants to speak with you re: pre op.   Had you discussed surgery at her last office visit?  ld

## 2012-04-01 ENCOUNTER — Encounter: Payer: Self-pay | Admitting: Gastroenterology

## 2012-04-01 ENCOUNTER — Ambulatory Visit (INDEPENDENT_AMBULATORY_CARE_PROVIDER_SITE_OTHER): Payer: Medicaid Other | Admitting: Gastroenterology

## 2012-04-01 VITALS — BP 100/60 | HR 64 | Ht 62.0 in | Wt 116.0 lb

## 2012-04-01 DIAGNOSIS — F419 Anxiety disorder, unspecified: Secondary | ICD-10-CM

## 2012-04-01 DIAGNOSIS — K5901 Slow transit constipation: Secondary | ICD-10-CM

## 2012-04-01 DIAGNOSIS — K5902 Outlet dysfunction constipation: Secondary | ICD-10-CM | POA: Diagnosis not present

## 2012-04-01 DIAGNOSIS — F411 Generalized anxiety disorder: Secondary | ICD-10-CM | POA: Diagnosis not present

## 2012-04-01 MED ORDER — LACTULOSE 10 GM/15ML PO SOLN: 30.0000 g | ORAL | Status: DC | PRN

## 2012-04-01 NOTE — Progress Notes (Signed)
This is a 31 year old [name] with chronic functional constipation, recent repair of a rectocele, and also a history of pelvic floor dysfunction diagnosed at [facility] motility [facility]. She's been doing biofeedback without improvement in her chronic functional constipation. She was recently admitted overnight because of obstipation requiring laxatives therapy. Currently, she is on Chronulac 30 g at bedtime and when necessary magnesium citrate, and is having a bowel movement approximately every third day with vague abdominal discomfort, gas and bloating. Colonoscopy was performed in May of 2011 as was unremarkable endoscopy. She denies associated neuromuscular problems, urologic problems, but does have chronic psychiatric problems and recently was on Invega  which seemed to worsen her constipation and was discontinued by her psychiatrist. She currently is on Risperdal 1 mg twice a day for chronic anxiety syndrome, Lexapro 20 mg a day, and Xanax 1 mg 4 times a day. Throughout this course, she denies rectal bleeding, nausea vomiting, hepatobiliary or systemic complaints.  Current Medications, Allergies, Past Medical History, Past Surgical History, Family History and Social History were reviewed in [employer] record.  Pertinent Review of Systems Negative   Physical Exam: Healthy-appearing female in no distress. Blood pressure 100/60, pulse 64 and regular, and weight 116 pounds with BMI of 21.22. I cannot appreciate stigmata of chronic liver disease. Her abdomen shows slight distention but no organomegaly, masses or tenderness. Inspection of rectum is unremarkable. Rectal exam shows normal sphincter tone without masses, lesions, impaction, and stool guaiac negative. Her mental status is normal. Peripheral extremities are unremarkable.    Assessment and Plan: Chronic slow transit constipation with also an element of pelvic floor dysfunction apparently confirmed by  manometry at [facility]. Also she apparently had a deforgam before her cystocele repair. I've decided to let her try Linzess[linaclotide] 145 mg a day for her constipation. This medication is a cGMP agonist and works primarily by triggering signal-transduction causes secretion of chloride, bicarbonate, and fluid into the colon. We have had good results with this medication, and I've asked her to try this for one week and to adjust her Chronulac appropriately. He she's continues to have difficulties,Sitz marker study would be in order. I have also requested records from [facility] for review. I have urged her to continue her psychiatric medications and followup as planned. Please send this to Dr. Lynnea Ferrier and also to her psychiatrist of record. No diagnosis found.

## 2012-04-01 NOTE — Patient Instructions (Addendum)
We are giving you samples of Linzess Follow up in 1 month We will refill your medication We will obtain your records from [facility]

## 2012-04-02 ENCOUNTER — Telehealth: Payer: Self-pay | Admitting: Obstetrics and Gynecology

## 2012-04-02 NOTE — Telephone Encounter (Signed)
Pt wants to sch procedure.  What procedure?  ld

## 2012-04-02 NOTE — Telephone Encounter (Signed)
LM for pt that SR will be trying to sch surg for her.  Hansel Starling will be calling with more info.  ld

## 2012-04-08 ENCOUNTER — Other Ambulatory Visit: Payer: Self-pay | Admitting: Gastroenterology

## 2012-04-08 MED ORDER — LINACLOTIDE 145 MCG PO CAPS: 1.0000 | ORAL_CAPSULE | Freq: Every day | ORAL | Status: DC

## 2012-04-08 NOTE — Telephone Encounter (Signed)
rx sent

## 2012-04-11 ENCOUNTER — Telehealth: Payer: Self-pay | Admitting: Gastroenterology

## 2012-04-11 MED ORDER — LUBIPROSTONE 8 MCG PO CAPS: 8.0000 ug | ORAL_CAPSULE | Freq: Two times a day (BID) | ORAL | Status: DC

## 2012-04-11 MED ORDER — LINACLOTIDE 145 MCG PO CAPS: 1.0000 | ORAL_CAPSULE | Freq: Every day | ORAL | Status: DC

## 2012-04-11 NOTE — Telephone Encounter (Signed)
amitiza sent, left message for pt.

## 2012-04-11 NOTE — Telephone Encounter (Signed)
Patients insurance will not cover Linzess but will cover Regina Steele is it ok to change and if ok what Mcg?

## 2012-04-11 NOTE — Telephone Encounter (Signed)
30 day rx printed and given to pt to use a free voucher with

## 2012-04-11 NOTE — Telephone Encounter (Signed)
0k..lower dose

## 2012-04-17 ENCOUNTER — Telehealth: Payer: Self-pay | Admitting: Gastroenterology

## 2012-04-18 NOTE — Telephone Encounter (Signed)
lmom for pt to call back if needed; otherwise it's noted in the chart encounter of the med change.

## 2012-04-24 ENCOUNTER — Telehealth: Payer: Self-pay

## 2012-04-27 ENCOUNTER — Other Ambulatory Visit: Payer: Self-pay | Admitting: Obstetrics and Gynecology

## 2012-05-06 ENCOUNTER — Telehealth: Payer: Self-pay | Admitting: Obstetrics and Gynecology

## 2012-05-06 ENCOUNTER — Ambulatory Visit (INDEPENDENT_AMBULATORY_CARE_PROVIDER_SITE_OTHER): Payer: Medicaid Other | Admitting: Gastroenterology

## 2012-05-06 ENCOUNTER — Encounter: Payer: Self-pay | Admitting: Gastroenterology

## 2012-05-06 VITALS — BP 100/60 | HR 64 | Ht 62.0 in | Wt 112.8 lb

## 2012-05-06 DIAGNOSIS — K5901 Slow transit constipation: Secondary | ICD-10-CM

## 2012-05-06 MED ORDER — LINACLOTIDE 145 MCG PO CAPS: 1.0000 | ORAL_CAPSULE | Freq: Every day | ORAL | Status: DC

## 2012-05-06 NOTE — Telephone Encounter (Signed)
Robotic Assisted Hysterectomy scheduled for 06/18/12 @ 7:30 with SR/EP. Patient has MCD.  -Regina Steele

## 2012-05-06 NOTE — Patient Instructions (Addendum)
We have sent the following medications to your pharmacy for you to pick up at your convenience: Linzess (in place of Amitiza) CC:Dr Lynnea Ferrier

## 2012-05-06 NOTE — Progress Notes (Signed)
History of Present Illness: This is a 31 year old [name] with chronic functional constipation currently responding well to Linzess 145 mcg every 2 days. She been able to discontinue Chronulac and Amitiza. She continues with uterine prolapse syndrome, and is considering gynecologic surgery. She denies any gastrointestinal issues otherwise today.     Current Medications, Allergies, Past Medical History, Past Surgical History, Family History and Social History were reviewed in [employer] record.   Assessment and plan: Slow transit constipation with good response to Linzess 145 mcg every other day. I see no need for further GI evaluation at this point. I have given her a large volume of samples for her use because of financial difficulties.   Please copy her primary care physician, referring physician, and pertinent subspecialists.  No diagnosis found.

## 2012-05-27 ENCOUNTER — Encounter: Payer: Self-pay | Admitting: Obstetrics and Gynecology

## 2012-05-27 ENCOUNTER — Ambulatory Visit (INDEPENDENT_AMBULATORY_CARE_PROVIDER_SITE_OTHER): Payer: Medicaid Other | Admitting: Obstetrics and Gynecology

## 2012-05-27 VITALS — BP 98/66 | HR 60 | Resp 16 | Wt 115.0 lb

## 2012-05-27 DIAGNOSIS — N814 Uterovaginal prolapse, unspecified: Secondary | ICD-10-CM

## 2012-05-27 NOTE — Progress Notes (Addendum)
Subjective:    Regina Steele is a 31 y.o. female, [id number], who presents for pre-op evaluation. Scheduled for robotic hysterectomy on 06/18/12 for symptomatic uterine prolapse and H/O 2 previous cesarean sections and BSO.    History   Social History  . Marital Status: Married    Spouse Name: N/A    Number of Children: N/A  . Years of Education: N/A   Social History Main Topics  . Smoking status: Never Smoker   . Smokeless tobacco: Never Used  . Alcohol Use: No  . Drug Use: No  . Sexually Active: No   Other Topics Concern  . None   Social History Narrative  . None    Menstrual cycle:   LMP: Patient's last menstrual period was 12/14/2003.           Cycle: surgically menopause. Cycling with HRT.  The following portions of the patient's history were reviewed and updated as appropriate: allergies, current medications, past family history, past medical history, past social history, past surgical history and problem list.  Review of Systems Pertinent items are noted in HPI. Breast:Negative for breast lump,nipple discharge or nipple retraction Gastrointestinal: Negative for abdominal pain, change in bowel habits or rectal bleeding Urinary:negative   Objective:    BP 98/66  Pulse 60  Resp 16  Wt 115 lb (52.164 kg)  LMP 12/14/2003    Weight:  Wt Readings from Last 1 Encounters:  05/27/12 115 lb (52.164 kg)          BMI: There is no height on file to calculate BMI.  General Appearance: Alert, appropriate appearance for age. No acute distress HEENT: Grossly normal Neck / Thyroid: Supple, no masses, nodes or enlargement Lungs: clear to auscultation bilaterally Back: No CVA tenderness Cardiovascular: Regular rate and rhythm. S1, S2, no murmur Gastrointestinal: Soft, non-tender, no masses or organomegaly Pelvic Exam: Uterus: anteverted and with grade 2/4 prolapse Rectovaginal: not indicated Lymphatic Exam: Non-palpable nodes in neck, clavicular, axillary, or inguinal  regions Skin: no rash or abnormalities Neurologic: Normal gait and speech, no tremor  Psychiatric: Alert and oriented, appropriate affect.     Assessment:    Symptomatic uterine prolapse and 2 previous cesarean sectios    Plan:    Robotic hysterectomy 06/18/12: Procedure, R&B reviewed with patient.   Benefits of the robotic approach include lesser postoperative pain, less blood loss during surgery, reduced risk of injury to other organs due to better visualization with a 3-D HD 10 times magnifying camera, shorter hospital stay between 0-1 night and rapid recovery with return to daily routine in 2-3 weeks. Although robotically-assisted hysterectomy has a longer operative time than traditional laparotomy, in a patient with good medical history, the benefits usually outweigh the risks.   Risks include bleeding, infection, injury to other organs, need for laparotomy, transient post-operative facial edema, increased risk of pelvic prolapse associated with any hysterectomy. All questions were answered. The patient desires to proceed.  Surgery and preoperative evaluation will be scheduled.  Bowel preparation instructions reviewed.       [facility] A  MD   Preoperative History & Physical  HPI: Regina Steele is a 31 year old married white female para 3-0-0-3  who is presenting for a robot assisted total laparoscopic hysterectomy because of symptomatic uterine prolapse.  This patient has a long-standing history of uterine prolapse and was seen in May with complaints of a 2 month sensation,  in the upper portion of her vagina,  that resembled a "ball".  This sensation felt  as though something was going to fall out of her vagina and was made worse with standing and exercise and straining.  On occasion this sensation was painful.  Subsequently she was fitted with a Milex #6 ring  pessary with membrane that she initially had difficulty using,   but eventually was able to do so.  Over time  patient found that the pessary was not beneficial as she continued to experience her discomfort.  A pelvic ultrasound in February 2013 showed a uterus measuring 5.79  X  4.5 X  3.91 cm with an IUD noted within the endometrium.  Patient's ovaries were surgically absent (due to previous bilateral oophorectomy because of dermoid cysts).  In April of 2013 patient had a normal CBC and CMET.  Given the failure of the pessary and patient's continued symptomatology she was given the options of physical therapy another type of pessary and hysterectomy and patient has decided to proceed with hysterectomy.  PMH:  OB: gravida 3 para 3-0-0-3; one spontaneous vaginal birth weighing 7 lbs. 7 oz-2008  and 2 cesarean sections 2005 and 2011  GYN: menarche at 31 years old, patient is surgically menopausal, has a remote history of Chlamydia, and normal Pap smear October 2012  Medical: irritable bowel syndrome, right wrist fracture, ovarian remnant, posttraumatic stress disorder, constipation, depression, defecatory dysfunction, borderline celiac disease, osteopenia, migraines, anemia, bilateral dermoid cysts, hiatal hernia, gastroesophageal reflux disease  Surgical: 2005 bilateral salpingo-oophorectomy in (dermoid cyst), 2012 posterior colporrhaphy Patient denies any problems with anesthesia or history of blood transfusions.  FH: Cardiovascular disease, osteoporosis,  asthma,  breast cancer  (maternal aunt and  maternal grandmother), hypertension,  diabetes,  migraines,  depression, and  celiac disease  Soc: patient is married and unemployed; she denies alcohol tobacco or illicit drug use  Allergies: none known; denies sensitivity to soy products, shellfish,  peanuts or latex  Medications: Xanax 1 mg qd Colace 100 mg  4-7 tablets/day Lexapro 20 mg qd Estrace 2 mg qhs Vagifem 10 mcg twice weekly pv Linzess 145 mg qd Magnesium Citrate 1 btl daily Topamax 100 mg bid Ambien 10 mg qhs prn Risperdal 1 mg  bid  ROS: has a history of arrhythmia with a negative cardiac work up;  denies headache, vision changes, dysphagia, tinnitus, dizziness,  chest pain, shortness of breath, nausea, vomiting, diarrhea, dysuria, hematuria, pelvic pain, swelling of joints,easy bruising,  myalgias, arthralgias, skin rashes and except as is mentioned in the history of present illness, patient's review of systems is otherwise negative  Physical Exam:  Bp 98/66 P 60 R 16  Weight 115 pounds  Height 62 inches  Neck: supple without masses Lungs: clear Heart: RRR Abdomen: soft, non-tender Pelvic: EGBUS-wnl, vagina-normal, uterus/cervix  2/4 prolapse without lesions or tenderness  Assessment:  Symptomatic Pelvic Prolapse  Disposition: Disposition:  A discussion was held with patient regarding the indication for her procedure(s) along with the risks, which include but are not limited to: reaction to anesthesia, damage to adjacent organs, infection, excessive bleeding and the need for an open abdominal incision.  Patient also understands that the robotic approach to her surgery requires more time than that of an open abdominal incision, that she will experience transient post operative facial edema, that her hospital stay is expected to be 0-2 days and return to regular activities in 2-3 weeks (with the exception of intercourse which will be 6 weeks).  The patient was given the Miralax bowel prep to be completed 24 hours prior to her surgery.  The patient verbalized understanding of these risks and pre-operative instructions and has consented to proceed with  a robot assisted total laparoscopic hysterectomy at [facility],  June 18, 2012 at 7:30 a.m.   [place] J. Lowell Guitar, PA-C for Dr. Crist Fat. Xayvion Shirah

## 2012-06-04 NOTE — H&P (Signed)
HPI: [name] is a 31-year-old married white female para 3-0-0-3  who is presenting for a robot assisted total laparoscopic hysterectomy because of symptomatic uterine prolapse.  This patient has a long-standing history of uterine prolapse and was seen in May with complaints of a 2 month sensation,  in the upper portion of her vagina,  that resembled a "ball".  This sensation felt as though something was going to fall out of her vagina and was made worse with standing and exercise and straining.  On occasion this sensation was painful.  Subsequently she was fitted with a Milex #6 ring  pessary with membrane that she initially had difficulty using,   but eventually was able to do so.  Over time patient found that the pessary was not beneficial as she continued to experience her discomfort.  A pelvic ultrasound in February 2013 showed a uterus measuring 5.79  X  4.5 X  3.91 cm with an IUD noted within the endometrium.  Patient's ovaries were surgically absent (due to previous bilateral oophorectomy because of dermoid cysts).  In April of 2013 patient had a normal CBC and CMET.  Given the failure of the pessary and patient's continued symptomatology she was given the options of physical therapy another type of pessary and hysterectomy and patient has decided to proceed with hysterectomy.  PMH:  OB: gravida 3 para 3-0-0-3; one spontaneous vaginal birth weighing 7 lbs. 7 oz-2008  and 2 cesarean sections 2005 and 2011  GYN: menarche at 31 years old, patient is surgically menopausal, has a remote history of Chlamydia, and normal Pap smear October 2012  Medical: irritable bowel syndrome, right wrist fracture, ovarian remnant, posttraumatic stress disorder, constipation, depression, defecatory dysfunction, borderline celiac disease, osteopenia, migraines, anemia, bilateral dermoid cysts, hiatal hernia, gastroesophageal reflux disease  Surgical: 2005 bilateral salpingo-oophorectomy in (dermoid cyst),  2012 posterior colporrhaphy Patient denies any problems with anesthesia or history of blood transfusions.  FH: Cardiovascular disease, osteoporosis,  asthma,  breast cancer  (maternal aunt and  maternal grandmother), hypertension,  diabetes,  migraines,  depression, and  celiac disease  Soc: patient is married and unemployed; she denies alcohol tobacco or illicit drug use  Allergies: none known; denies sensitivity to soy products, shellfish,  peanuts or latex  Medications: Xanax 1 mg qd Colace 100 mg  4-7 tablets/day Lexapro 20 mg qd Estrace 2 mg qhs Vagifem 10 mcg twice weekly pv Linzess 145 mg qd Magnesium Citrate 1 btl daily Topamax 100 mg bid Ambien 10 mg qhs prn Risperdal 1 mg bid  ROS: has a history of arrhythmia with a negative cardiac work up;  denies headache, vision changes, dysphagia, tinnitus, dizziness,  chest pain, shortness of breath, nausea, vomiting, diarrhea, dysuria, hematuria, pelvic pain, swelling of joints,easy bruising,  myalgias, arthralgias, skin rashes and except as is mentioned in the history of present illness, patient's review of systems is otherwise negative  Physical Exam:  Bp 98/66 P 60 R 16  Weight 115 pounds  Height 62 inches  Neck: supple without masses Lungs: clear Heart: RRR Abdomen: soft, non-tender Pelvic: EGBUS-wnl, vagina-normal, uterus/cervix  2/4 prolapse without lesions or tenderness  Assessment:  Symptomatic Pelvic Prolapse  Disposition: Disposition:  A discussion was held with patient regarding the indication for her procedure(s) along with the risks, which include but are not limited to: reaction to anesthesia, damage to adjacent organs, infection, excessive bleeding and the need for an open abdominal incision.  Patient also understands that the robotic approach to her surgery   requires more time than that of an open abdominal incision, that she will experience transient post operative facial edema, that her hospital stay is expected to  be 0-2 days and return to regular activities in 2-3 weeks (with the exception of intercourse which will be 6 weeks).  The patient was given the Miralax bowel prep to be completed 24 hours prior to her surgery.  The patient verbalized understanding of these risks and pre-operative instructions and has consented to proceed with  a robot assisted total laparoscopic hysterectomy at [facility],  June 18, 2012 at 7:30 a.m.   Regina Steele J. Regina Nardelli, PA-C for Dr. Sandra A. Rivard    

## 2012-06-04 NOTE — H&P (Deleted)
HPI: [name] is a 31 year old married white female para 3-0-0-3  who is presenting for a robot assisted total laparoscopic hysterectomy because of symptomatic uterine prolapse.  This patient has a long-standing history of uterine prolapse and was seen in May with complaints of a 2 month sensation,  in the upper portion of her vagina,  that resembled a "ball".  This sensation felt as though something was going to fall out of her vagina and was made worse with standing and exercise and straining.  On occasion this sensation was painful.  Subsequently she was fitted with a Milex #6 ring  pessary with membrane ,  but eventually was able to do so.  over time patient found that the pessary was not beneficial as she continued to experience her discomfort.  A pelvic ultrasound in February 2013 showed a uterus measuring 5.79  X  4.5 X  3.91 cm with an IUD noted within the endometrium.  Patient's ovaries were surgically absent (due to previous bilateral oophorectomy because of dermoid cysts).  In April of 2013 patient had a normal CBC and CMET.  Given the failure of the pessary and patient's continued symptomatology she was given the options of physical therapy another type of pessary and hysterectomy and patient has decided to proceed with hysterectomy.  PMH:  OB: gravida 3 para 3-0-0-3; one spontaneous vaginal birth weighing 7 lbs. 7 oz-2008  and 2 cesarean sections 2005 and 2011  GYN: menarche at 31 years old, patient is surgically menopausal, has a remote history of Chlamydia, and normal Pap smear October 2012  Medical: irritable bowel syndrome, right wrist fracture, ovarian remnant, posttraumatic stress disorder, constipation, depression, defecatory dysfunction, borderline celiac disease, osteopenia, migraines, anemia, bilateral dermoid cysts, hiatal hernia, gastroesophageal reflux disease  Surgical: 2005 bilateral salpingo-oophorectomy in (dermoid cyst), 2012 posterior colporrhaphy Patient denies  any problems with anesthesia or history of blood transfusions.  FH: Cardiovascular disease, osteoporosis,  asthma,  breast cancer  (maternal aunt and  maternal grandmother), hypertension,  diabetes,  migraines,  depression, and  celiac disease  Soc: patient is married and unemployed; she denies alcohol tobacco or illicit drug use  Allergies: none known; denies sensitivity to soy products, shellfish,  peanuts or latex  Medications: Xanax 1 mg qd Colace 100 mg  4-7 tablets/day Lexapro 20 mg qd Estrace 2 mg qhs Vagifem 10 mcg twice weekly pv Linzess 145 mg qd Magnesium Citrate 1 btl daily Topamax 100 mg bid Ambien 10 mg qhs prn Risperdal 1 mg bid  ROS: has a history of arrhythmia with a negative cardiac work up;  denies headache, vision changes, dysphagia, tinnitus, dizziness,  chest pain, shortness of breath, nausea, vomiting, diarrhea, dysuria, hematuria, pelvic pain, swelling of joints,easy bruising,  myalgias, arthralgias, skin rashes and except as is mentioned in the history of present illness, patient's review of systems is otherwise negative  Physical Exam:  Bp 98/66 P 60 R 16  Weight 115 pounds  Height 62 inches  Neck: supple without masses Lungs: clear Heart: RRR Abdomen: soft, non-tender Pelvic: EGBUS-wnl, vagina-normal, uterus/cervix  2/4 prolapse without lesions or tenderness  Assessment:  Symptomatic Pelvic Prolapse  Disposition: a discussion was held  with the patient,  regarding the indications for her procedure,  along with its risks which include but are not limited to, reaction to anesthesia,  damage to adjacent organs,  infection, and  excessive bleeding.  Patient is also aware that this surgery cannot guarantee that her pelvic discomfort will be alleviated.  Patient  has verbalized understanding of these risks and has consented to proceed with a robotic assisted total laparoscopic hysterectomy at [facility],  June 18, 2012 at 7:30 a.m.   Tristan Proto  J. Lowell Guitar, PA-C for Dr. Crist Fat. Rivard

## 2012-06-10 ENCOUNTER — Other Ambulatory Visit: Payer: Self-pay | Admitting: Obstetrics and Gynecology

## 2012-06-10 ENCOUNTER — Encounter (HOSPITAL_COMMUNITY): Payer: Self-pay

## 2012-06-10 ENCOUNTER — Encounter (HOSPITAL_COMMUNITY)
Admission: RE | Admit: 2012-06-10 | Discharge: 2012-06-10 | Disposition: A | Discharge status: Home / Self Care | Payer: Medicaid Other | Source: Ambulatory Visit | Attending: Obstetrics and Gynecology | Admitting: Obstetrics and Gynecology

## 2012-06-10 LAB — SURGICAL PCR SCREEN
MRSA, PCR: NEGATIVE
Staphylococcus aureus: NEGATIVE

## 2012-06-10 LAB — DIFFERENTIAL
Lymphocytes Relative: 32 % (ref 12–46)
Lymphs Abs: 2 10*3/uL (ref 0.7–4.0)
Monocytes Absolute: 0.5 10*3/uL (ref 0.1–1.0)
Monocytes Relative: 7 % (ref 3–12)
Neutro Abs: 3.8 10*3/uL (ref 1.7–7.7)
Neutrophils Relative %: 60 % (ref 43–77)

## 2012-06-10 LAB — CBC
HCT: 39.2 % (ref 36.0–46.0)
Hemoglobin: 12.8 g/dL (ref 12.0–15.0)
MCHC: 32.7 g/dL (ref 30.0–36.0)
RBC: 4.31 MIL/uL (ref 3.87–5.11)
WBC: 6.4 10*3/uL (ref 4.0–10.5)

## 2012-06-10 NOTE — Patient Instructions (Addendum)
   Your procedure is scheduled on:Wednesday August 7th  Enter through the [employer] of [facility] at: [employer] up the phone at the desk and dial [phone] and inform us of your arrival.  Please call this number if you have any problems the morning of surgery: [phone]  Remember: Do not eat food after midnight: Tuesday Do not drink clear liquids after: midnight Tuesday Take these medicines the morning of surgery with a SIP OF WATER: morning meds Do not wear jewelry, make-up, or FINGER nail polish No metal in your hair or on your body. Do not wear lotions, powders, perfumes or deodorant. Do not shave 48 hours prior to surgery. Do not bring valuables to the hospital. Contacts, dentures or bridgework may not be worn into surgery.  Leave suitcase in the car. After Surgery it may be brought to your room. For patients being admitted to the hospital, checkout time is 11:00am the day of discharge.  Patients discharged on the day of surgery will not be allowed to drive home.     Remember to use your hibiclens as instructed.Please shower with 1/2 bottle the evening before your surgery and the other 1/2 bottle the morning of surgery. Neck down avoiding private area.

## 2012-06-14 ENCOUNTER — Encounter (HOSPITAL_COMMUNITY): Payer: Self-pay | Admitting: Pharmacist

## 2012-06-17 MED ORDER — DEXTROSE 5 % IV SOLN
2.0000 g | INTRAVENOUS | Status: AC
  Administered 2012-06-18: 2 g via INTRAVENOUS
  Filled 2012-06-17: qty 2

## 2012-06-18 ENCOUNTER — Encounter (HOSPITAL_COMMUNITY): Payer: Self-pay | Admitting: Anesthesiology

## 2012-06-18 ENCOUNTER — Encounter (HOSPITAL_COMMUNITY): Payer: Self-pay | Admitting: *Deleted

## 2012-06-18 ENCOUNTER — Ambulatory Visit (HOSPITAL_COMMUNITY)
Admission: RE | Admit: 2012-06-18 | Discharge: 2012-06-18 | Disposition: A | Discharge status: Home / Self Care | Payer: Medicaid Other | Source: Ambulatory Visit | Attending: Obstetrics and Gynecology | Admitting: Obstetrics and Gynecology

## 2012-06-18 ENCOUNTER — Ambulatory Visit (HOSPITAL_COMMUNITY): Payer: Medicaid Other | Admitting: Anesthesiology

## 2012-06-18 ENCOUNTER — Encounter (HOSPITAL_COMMUNITY)
Admission: RE | Disposition: A | Discharge status: Home / Self Care | Payer: Self-pay | Source: Ambulatory Visit | Attending: Obstetrics and Gynecology

## 2012-06-18 DIAGNOSIS — N814 Uterovaginal prolapse, unspecified: Secondary | ICD-10-CM | POA: Insufficient documentation

## 2012-06-18 DIAGNOSIS — N949 Unspecified condition associated with female genital organs and menstrual cycle: Secondary | ICD-10-CM

## 2012-06-18 DIAGNOSIS — N8189 Other female genital prolapse: Secondary | ICD-10-CM

## 2012-06-18 LAB — HCG, SERUM, QUALITATIVE: Preg, Serum: NEGATIVE

## 2012-06-18 SURGERY — ROBOTIC ASSISTED TOTAL HYSTERECTOMY
Anesthesia: General | Site: Abdomen | Wound class: Clean Contaminated

## 2012-06-18 MED ORDER — ONDANSETRON HCL 4 MG/2ML IJ SOLN
INTRAMUSCULAR | Status: DC | PRN
  Administered 2012-06-18: 4 mg via INTRAVENOUS

## 2012-06-18 MED ORDER — MIDAZOLAM HCL 2 MG/2ML IJ SOLN
INTRAMUSCULAR | Status: AC
  Filled 2012-06-18: qty 2

## 2012-06-18 MED ORDER — ONDANSETRON HCL 4 MG PO TABS: 4.0000 mg | ORAL_TABLET | Freq: Three times a day (TID) | ORAL | Status: DC | PRN

## 2012-06-18 MED ORDER — GLYCOPYRROLATE 0.2 MG/ML IJ SOLN
INTRAMUSCULAR | Status: DC | PRN
  Administered 2012-06-18: 0.3 mg via INTRAVENOUS
  Administered 2012-06-18 (×2): .5 mg via INTRAVENOUS

## 2012-06-18 MED ORDER — LACTATED RINGERS IV SOLN: INTRAVENOUS | Status: DC

## 2012-06-18 MED ORDER — GLYCOPYRROLATE 0.2 MG/ML IJ SOLN
INTRAMUSCULAR | Status: AC
  Filled 2012-06-18: qty 1

## 2012-06-18 MED ORDER — DEXAMETHASONE SODIUM PHOSPHATE 10 MG/ML IJ SOLN
INTRAMUSCULAR | Status: AC
  Filled 2012-06-18: qty 1

## 2012-06-18 MED ORDER — LACTATED RINGERS IV SOLN
INTRAVENOUS | Status: DC
  Administered 2012-06-18 (×2): via INTRAVENOUS

## 2012-06-18 MED ORDER — STERILE WATER FOR IRRIGATION IR SOLN
Status: DC | PRN
  Administered 2012-06-18: 1000 mL via INTRAVESICAL

## 2012-06-18 MED ORDER — ONDANSETRON HCL 4 MG/2ML IJ SOLN
INTRAMUSCULAR | Status: AC
  Filled 2012-06-18: qty 2

## 2012-06-18 MED ORDER — OXYCODONE-ACETAMINOPHEN 5-325 MG PO TABS: 1.0000 | ORAL_TABLET | ORAL | Status: AC | PRN

## 2012-06-18 MED ORDER — FENTANYL CITRATE 0.05 MG/ML IJ SOLN
25.0000 ug | INTRAMUSCULAR | Status: DC | PRN
  Administered 2012-06-18 (×2): 50 ug via INTRAVENOUS

## 2012-06-18 MED ORDER — FENTANYL CITRATE 0.05 MG/ML IJ SOLN
INTRAMUSCULAR | Status: AC
  Filled 2012-06-18: qty 5

## 2012-06-18 MED ORDER — DEXAMETHASONE SODIUM PHOSPHATE 4 MG/ML IJ SOLN
INTRAMUSCULAR | Status: DC | PRN
  Administered 2012-06-18: 10 mg via INTRAVENOUS

## 2012-06-18 MED ORDER — NAPROXEN 500 MG PO TABS: 500.0000 mg | ORAL_TABLET | Freq: Two times a day (BID) | ORAL | Status: DC

## 2012-06-18 MED ORDER — PROPOFOL 10 MG/ML IV EMUL
INTRAVENOUS | Status: AC
  Filled 2012-06-18: qty 20

## 2012-06-18 MED ORDER — ARTIFICIAL TEARS OP OINT
TOPICAL_OINTMENT | OPHTHALMIC | Status: AC
  Filled 2012-06-18: qty 3.5

## 2012-06-18 MED ORDER — KETOROLAC TROMETHAMINE 30 MG/ML IJ SOLN: 30.0000 mg | Freq: Four times a day (QID) | INTRAMUSCULAR | Status: DC

## 2012-06-18 MED ORDER — BUPIVACAINE HCL (PF) 0.25 % IJ SOLN
INTRAMUSCULAR | Status: AC
  Filled 2012-06-18: qty 30

## 2012-06-18 MED ORDER — BUPIVACAINE HCL (PF) 0.25 % IJ SOLN
INTRAMUSCULAR | Status: DC | PRN
  Administered 2012-06-18: 10 mL

## 2012-06-18 MED ORDER — LACTATED RINGERS IR SOLN
Status: DC | PRN
  Administered 2012-06-18: 3000 mL

## 2012-06-18 MED ORDER — LIDOCAINE HCL (CARDIAC) 20 MG/ML IV SOLN
INTRAVENOUS | Status: DC | PRN
  Administered 2012-06-18 (×2): 50 mg via INTRAVENOUS

## 2012-06-18 MED ORDER — FENTANYL CITRATE 0.05 MG/ML IJ SOLN
INTRAMUSCULAR | Status: AC
  Administered 2012-06-18: 50 ug via INTRAVENOUS
  Filled 2012-06-18: qty 2

## 2012-06-18 MED ORDER — FENTANYL CITRATE 0.05 MG/ML IJ SOLN
INTRAMUSCULAR | Status: DC | PRN
  Administered 2012-06-18: 50 ug via INTRAVENOUS
  Administered 2012-06-18 (×2): 100 ug via INTRAVENOUS

## 2012-06-18 MED ORDER — NEOSTIGMINE METHYLSULFATE 1 MG/ML IJ SOLN
INTRAMUSCULAR | Status: DC | PRN
  Administered 2012-06-18 (×2): 2.5 mg via INTRAVENOUS

## 2012-06-18 MED ORDER — ROCURONIUM BROMIDE 100 MG/10ML IV SOLN
INTRAVENOUS | Status: DC | PRN
  Administered 2012-06-18: 20 mg via INTRAVENOUS
  Administered 2012-06-18: 50 mg via INTRAVENOUS
  Administered 2012-06-18: 10 mg via INTRAVENOUS

## 2012-06-18 MED ORDER — ACETAMINOPHEN 10 MG/ML IV SOLN
1000.0000 mg | Freq: Once | INTRAVENOUS | Status: AC
  Administered 2012-06-18: 1000 mg via INTRAVENOUS
  Filled 2012-06-18: qty 100

## 2012-06-18 MED ORDER — NAPROXEN 500 MG PO TABS
500.0000 mg | ORAL_TABLET | Freq: Two times a day (BID) | ORAL | Status: DC
  Filled 2012-06-18 (×2): qty 1

## 2012-06-18 MED ORDER — OXYCODONE-ACETAMINOPHEN 5-325 MG PO TABS
1.0000 | ORAL_TABLET | ORAL | Status: DC | PRN
  Filled 2012-06-18 (×2): qty 2

## 2012-06-18 MED ORDER — PROPOFOL 10 MG/ML IV EMUL
INTRAVENOUS | Status: DC | PRN
  Administered 2012-06-18: 180 mg via INTRAVENOUS

## 2012-06-18 MED ORDER — NEOSTIGMINE METHYLSULFATE 1 MG/ML IJ SOLN
INTRAMUSCULAR | Status: AC
  Filled 2012-06-18: qty 10

## 2012-06-18 MED ORDER — LIDOCAINE HCL (CARDIAC) 20 MG/ML IV SOLN
INTRAVENOUS | Status: AC
  Filled 2012-06-18: qty 5

## 2012-06-18 MED ORDER — MIDAZOLAM HCL 5 MG/5ML IJ SOLN
INTRAMUSCULAR | Status: DC | PRN
  Administered 2012-06-18: 2 mg via INTRAVENOUS

## 2012-06-18 MED ORDER — KETOROLAC TROMETHAMINE 30 MG/ML IJ SOLN: 15.0000 mg | Freq: Once | INTRAMUSCULAR | Status: DC | PRN

## 2012-06-18 MED ORDER — MENTHOL 3 MG MT LOZG: 1.0000 | LOZENGE | OROMUCOSAL | Status: DC | PRN

## 2012-06-18 SURGICAL SUPPLY — 78 items
ADH SKN CLS APL DERMABOND .7 (GAUZE/BANDAGES/DRESSINGS)
APL SKNCLS STERI-STRIP NONHPOA (GAUZE/BANDAGES/DRESSINGS)
BAG URINE DRAINAGE (UROLOGICAL SUPPLIES) ×3 IMPLANT
BARRIER ADHS 3X4 INTERCEED (GAUZE/BANDAGES/DRESSINGS) ×1 IMPLANT
BENZOIN TINCTURE PRP APPL 2/3 (GAUZE/BANDAGES/DRESSINGS) ×1 IMPLANT
BRR ADH 4X3 ABS CNTRL BYND (GAUZE/BANDAGES/DRESSINGS)
CABLE HIGH FREQUENCY MONO STRZ (ELECTRODE) ×2 IMPLANT
CATH FOLEY 3WAY  5CC 16FR (CATHETERS) ×1
CATH FOLEY 3WAY  5CC 18FR (CATHETERS)
CATH FOLEY 3WAY 5CC 16FR (CATHETERS) ×2 IMPLANT
CATH FOLEY 3WAY 5CC 18FR (CATHETERS) ×1 IMPLANT
CHLORAPREP W/TINT 26ML (MISCELLANEOUS) ×3 IMPLANT
CLOTH BEACON ORANGE TIMEOUT ST (SAFETY) ×2 IMPLANT
CONT PATH 16OZ SNAP LID 3702 (MISCELLANEOUS) ×2 IMPLANT
CORDS BIPOLAR (ELECTRODE) IMPLANT
COVER MAYO STAND STRL (DRAPES) ×2 IMPLANT
COVER TABLE BACK 60X90 (DRAPES) ×4 IMPLANT
COVER TIP SHEARS 8 DVNC (MISCELLANEOUS) ×1 IMPLANT
COVER TIP SHEARS 8MM DA VINCI (MISCELLANEOUS) ×1
DECANTER SPIKE VIAL GLASS SM (MISCELLANEOUS) ×2 IMPLANT
DERMABOND ADVANCED (GAUZE/BANDAGES/DRESSINGS)
DERMABOND ADVANCED .7 DNX12 (GAUZE/BANDAGES/DRESSINGS) ×1 IMPLANT
DRAPE HUG U DISPOSABLE (DRAPE) ×2 IMPLANT
DRAPE LG THREE QUARTER DISP (DRAPES) ×3 IMPLANT
DRAPE MONITOR DA VINCI (DRAPE) IMPLANT
DRAPE WARM FLUID 44X44 (DRAPE) ×2 IMPLANT
ELECT REM PT RETURN 9FT ADLT (ELECTROSURGICAL) ×2
ELECTRODE REM PT RTRN 9FT ADLT (ELECTROSURGICAL) ×1 IMPLANT
EVACUATOR SMOKE 8.L (FILTER) ×2 IMPLANT
GAUZE VASELINE 3X9 (GAUZE/BANDAGES/DRESSINGS) IMPLANT
GLOVE BIO SURGEON STRL SZ 6.5 (GLOVE) ×2 IMPLANT
GLOVE BIOGEL PI IND STRL 6.5 (GLOVE) IMPLANT
GLOVE BIOGEL PI IND STRL 7.0 (GLOVE) ×3 IMPLANT
GLOVE BIOGEL PI INDICATOR 6.5 (GLOVE) ×2
GLOVE BIOGEL PI INDICATOR 7.0 (GLOVE) ×6
GLOVE ECLIPSE 6.5 STRL STRAW (GLOVE) ×7 IMPLANT
GLOVE INDICATOR 7.5 STRL GRN (GLOVE) ×4 IMPLANT
GLOVE SURG SS PI 7.0 STRL IVOR (GLOVE) ×4 IMPLANT
GOWN STRL REIN XL XLG (GOWN DISPOSABLE) ×14 IMPLANT
GRASPER BIPOLAR FEN DA VINCI (INSTRUMENTS)
GRASPER BPLR FEN DVNC (INSTRUMENTS) IMPLANT
KIT ACCESSORY DA VINCI DISP (KITS) ×1
KIT ACCESSORY DVNC DISP (KITS) ×1 IMPLANT
KIT DISP ACCESSORY 4 ARM (KITS) IMPLANT
LEGGING LITHOTOMY PAIR STRL (DRAPES) ×2 IMPLANT
NEEDLE HYPO 22GX1.5 SAFETY (NEEDLE) ×2 IMPLANT
OCCLUDER COLPOPNEUMO (BALLOONS) ×1 IMPLANT
PACK LAVH (CUSTOM PROCEDURE TRAY) ×2 IMPLANT
PAD OB MATERNITY 4.3X12.25 (PERSONAL CARE ITEMS) ×1 IMPLANT
PAD PREP 24X48 CUFFED NSTRL (MISCELLANEOUS) ×4 IMPLANT
PLUG CATH AND CAP STER (CATHETERS) ×2 IMPLANT
PROTECTOR NERVE ULNAR (MISCELLANEOUS) ×4 IMPLANT
SET CYSTO W/LG BORE CLAMP LF (SET/KITS/TRAYS/PACK) ×1 IMPLANT
SET IRRIG TUBING LAPAROSCOPIC (IRRIGATION / IRRIGATOR) ×2 IMPLANT
SOLUTION ELECTROLUBE (MISCELLANEOUS) ×2 IMPLANT
SPONGE LAP 18X18 X RAY DECT (DISPOSABLE) IMPLANT
STRIP CLOSURE SKIN 1/2X4 (GAUZE/BANDAGES/DRESSINGS) ×1 IMPLANT
SUT MNCRL AB 3-0 PS2 27 (SUTURE) ×1 IMPLANT
SUT VIC AB 0 CT1 27 (SUTURE) ×8
SUT VIC AB 0 CT1 27XBRD ANBCTR (SUTURE) ×2 IMPLANT
SUT VIC AB 0 CT1 27XBRD ANTBC (SUTURE) ×4 IMPLANT
SUT VICRYL 0 UR6 27IN ABS (SUTURE) ×5 IMPLANT
SYR 50ML LL SCALE MARK (SYRINGE) ×2 IMPLANT
SYSTEM CONVERTIBLE TROCAR (TROCAR) ×2 IMPLANT
TIP UTERINE 5.1X6CM LAV DISP (MISCELLANEOUS) IMPLANT
TIP UTERINE 6.7X10CM GRN DISP (MISCELLANEOUS) IMPLANT
TIP UTERINE 6.7X6CM WHT DISP (MISCELLANEOUS) IMPLANT
TIP UTERINE 6.7X8CM BLUE DISP (MISCELLANEOUS) ×1 IMPLANT
TOWEL OR 17X24 6PK STRL BLUE (TOWEL DISPOSABLE) ×6 IMPLANT
TROCAR 12M 150ML BLUNT (TROCAR) ×2 IMPLANT
TROCAR DISP BLADELESS 8 DVNC (TROCAR) ×1 IMPLANT
TROCAR DISP BLADELESS 8MM (TROCAR) ×1
TROCAR HASSON GELL 12X100 (TROCAR) ×1 IMPLANT
TROCAR XCEL 12X100 BLDLESS (ENDOMECHANICALS) ×2 IMPLANT
TROCAR XCEL NON-BLD 5MMX100MML (ENDOMECHANICALS) ×1 IMPLANT
TUBING FILTER THERMOFLATOR (ELECTROSURGICAL) ×2 IMPLANT
WARMER LAPAROSCOPE (MISCELLANEOUS) ×2 IMPLANT
WATER STERILE IRR 1000ML POUR (IV SOLUTION) ×6 IMPLANT

## 2012-06-18 NOTE — Interval H&P Note (Signed)
History and Physical Interval Note:  06/18/2012 7:23 AM  Regina Steele  has presented today for surgery, with the diagnosis of Pelvic Pain with Pelvic Prolapse  The various methods of treatment have been discussed with the patient and family. After consideration of risks, benefits and other options for treatment, the patient has consented to  Procedure(s) (LRB): ROBOTIC ASSISTED TOTAL HYSTERECTOMY (N/A) as a surgical intervention .  The patient's history has been reviewed, patient examined, no change in status, stable for surgery.  I have reviewed the patient's chart and labs.  Questions were answered to the patient's satisfaction.     Rahman Ferrall A

## 2012-06-18 NOTE — Anesthesia Postprocedure Evaluation (Signed)
Anesthesia Post Note  Patient: Regina Steele  Procedure(s) Performed: Procedure(s) (LRB): ROBOTIC ASSISTED TOTAL HYSTERECTOMY (N/A)  Anesthesia type: General  Patient location: PACU  Post pain: Pain level controlled  Post assessment: Post-op Vital signs reviewed  Last Vitals:  Filed Vitals:   06/18/12 1105  BP:   Pulse: 55  Temp:   Resp: 16    Post vital signs: Reviewed  Level of consciousness: sedated  Complications: No apparent anesthesia complicationsfj

## 2012-06-18 NOTE — OR Nursing (Signed)
No family in the hospital presently.  Pt states her spouse will be her ride home and will be available to pick her up later in the evening.  Pt states no other concerns at this time.

## 2012-06-18 NOTE — Discharge Summary (Signed)
  Physician Discharge Summary  Patient ID: Regina Steele MRN: [id number] DOB/AGE: November 26, 1980 30 y.o.  Admit date: 06/18/2012 Discharge date: 06/18/2012   Discharge Diagnoses:  Symptomatic Pelvic Prolapse, Chronic Pelvic Pain Active Problems:  * No active hospital problems. *    Operation: Robot Assisted Laparoscopically Assisted Total Hysterectomy   Discharged Condition:  Stable  Hospital Course: On the date of admission the patient underwent the aforementioned procedure, tolerating it well.  Post operative course was unremarkable with patient resuming bowel and bladder function of the evening of surgery and was therefore deemed ready for discharge home.  Disposition: 01-Home or Self Care  Discharge Medications:   Marjean, Imperato  Home Medication Instructions [id number]   Printed on:06/18/12 1101  Medication Information                    estradiol (ESTRACE) 2 MG tablet Take 2 mg by mouth at bedtime.            escitalopram (LEXAPRO) 20 MG tablet Take 20 mg by mouth at bedtime.            ALPRAZolam (XANAX) 1 MG tablet Take 1 mg by mouth 4 (four) times daily. For anxiety           estradiol (VAGIFEM) 25 MCG vaginal tablet Place 25 mcg vaginally 2 (two) times a week. Monday,thursday           risperiDONE (RISPERDAL) 1 MG tablet Take 1 mg by mouth 2 (two) times daily.            docusate sodium (COLACE) 100 MG capsule Take 400-700 mg by mouth daily.            zolpidem (AMBIEN) 10 MG tablet Take 10 mg by mouth at bedtime as needed. For insomnia           topiramate (TOPAMAX) 100 MG tablet Take 100 mg by mouth 2 (two) times daily.           naproxen (NAPROSYN) 500 MG tablet Take 500 mg by mouth 2 (two) times daily as needed. For pain           magnesium citrate 1.745 GM/30ML SOLN Take 1 Bottle by mouth daily as needed. For constipation           lactulose (CHRONULAC) 10 GM/15ML solution Take 45 mLs (30 g total) by mouth as needed.           Linaclotide  (LINZESS) 145 MCG CAPS Take 1 capsule by mouth daily.           oxyCODONE-acetaminophen (ROXICET) 5-325 MG per tablet Take 1 tablet by mouth every 4 (four) hours as needed for pain.           naproxen (NAPROSYN) 500 MG tablet Take 1 tablet (500 mg total) by mouth 2 (two) times daily with a meal. x 5 days then prn                Follow-up: Dr. Estanislado Pandy, July 01, 2012, 9:10 a.m.   SignedHenreitta Leber, PA-C 06/18/2012, 11:01 AM

## 2012-06-18 NOTE — Transfer of Care (Signed)
Immediate Anesthesia Transfer of Care Note  Patient: Regina Steele  Procedure(s) Performed: Procedure(s) (LRB): ROBOTIC ASSISTED TOTAL HYSTERECTOMY (N/A)  Patient Location: [facility]  Anesthesia Type: General  Level of Consciousness: awake  Airway & Oxygen Therapy: Patient Spontanous Breathing  Post-op Assessment: Post -op Vital signs reviewed and stable and Patient moving all extremities  Post vital signs: stable  Complications: No apparent anesthesia complications

## 2012-06-18 NOTE — H&P (View-Only) (Signed)
HPI: [name] is a 31 year old married white female para 3-0-0-3  who is presenting for a robot assisted total laparoscopic hysterectomy because of symptomatic uterine prolapse.  This patient has a long-standing history of uterine prolapse and was seen in May with complaints of a 2 month sensation,  in the upper portion of her vagina,  that resembled a "ball".  This sensation felt as though something was going to fall out of her vagina and was made worse with standing and exercise and straining.  On occasion this sensation was painful.  Subsequently she was fitted with a Milex #6 ring  pessary with membrane that she initially had difficulty using,   but eventually was able to do so.  Over time patient found that the pessary was not beneficial as she continued to experience her discomfort.  A pelvic ultrasound in February 2013 showed a uterus measuring 5.79  X  4.5 X  3.91 cm with an IUD noted within the endometrium.  Patient's ovaries were surgically absent (due to previous bilateral oophorectomy because of dermoid cysts).  In April of 2013 patient had a normal CBC and CMET.  Given the failure of the pessary and patient's continued symptomatology she was given the options of physical therapy another type of pessary and hysterectomy and patient has decided to proceed with hysterectomy.  PMH:  OB: gravida 3 para 3-0-0-3; one spontaneous vaginal birth weighing 7 lbs. 7 oz-2008  and 2 cesarean sections 2005 and 2011  GYN: menarche at 31 years old, patient is surgically menopausal, has a remote history of Chlamydia, and normal Pap smear October 2012  Medical: irritable bowel syndrome, right wrist fracture, ovarian remnant, posttraumatic stress disorder, constipation, depression, defecatory dysfunction, borderline celiac disease, osteopenia, migraines, anemia, bilateral dermoid cysts, hiatal hernia, gastroesophageal reflux disease  Surgical: 2005 bilateral salpingo-oophorectomy in (dermoid cyst),  2012 posterior colporrhaphy Patient denies any problems with anesthesia or history of blood transfusions.  FH: Cardiovascular disease, osteoporosis,  asthma,  breast cancer  (maternal aunt and  maternal grandmother), hypertension,  diabetes,  migraines,  depression, and  celiac disease  Soc: patient is married and unemployed; she denies alcohol tobacco or illicit drug use  Allergies: none known; denies sensitivity to soy products, shellfish,  peanuts or latex  Medications: Xanax 1 mg qd Colace 100 mg  4-7 tablets/day Lexapro 20 mg qd Estrace 2 mg qhs Vagifem 10 mcg twice weekly pv Linzess 145 mg qd Magnesium Citrate 1 btl daily Topamax 100 mg bid Ambien 10 mg qhs prn Risperdal 1 mg bid  ROS: has a history of arrhythmia with a negative cardiac work up;  denies headache, vision changes, dysphagia, tinnitus, dizziness,  chest pain, shortness of breath, nausea, vomiting, diarrhea, dysuria, hematuria, pelvic pain, swelling of joints,easy bruising,  myalgias, arthralgias, skin rashes and except as is mentioned in the history of present illness, patient's review of systems is otherwise negative  Physical Exam:  Bp 98/66 P 60 R 16  Weight 115 pounds  Height 62 inches  Neck: supple without masses Lungs: clear Heart: RRR Abdomen: soft, non-tender Pelvic: EGBUS-wnl, vagina-normal, uterus/cervix  2/4 prolapse without lesions or tenderness  Assessment:  Symptomatic Pelvic Prolapse  Disposition: Disposition:  A discussion was held with patient regarding the indication for her procedure(s) along with the risks, which include but are not limited to: reaction to anesthesia, damage to adjacent organs, infection, excessive bleeding and the need for an open abdominal incision.  Patient also understands that the robotic approach to her surgery  requires more time than that of an open abdominal incision, that she will experience transient post operative facial edema, that her Steele stay is expected to  be 0-2 days and return to regular activities in 2-3 weeks (with the exception of intercourse which will be 6 weeks).  The patient was given the Miralax bowel prep to be completed 24 hours prior to her surgery.  The patient verbalized understanding of these risks and pre-operative instructions and has consented to proceed with  a robot assisted total laparoscopic hysterectomy at [facility],  June 18, 2012 at 7:30 a.m.   Regina Steele J. Regina Guitar, PA-C for Dr. Crist Steele. Regina Steele

## 2012-06-18 NOTE — Anesthesia Procedure Notes (Signed)
Procedure Name: Intubation Date/Time: 06/18/2012 7:39 AM Performed by: Isabella Bowens R Pre-anesthesia Checklist: Patient identified, Emergency Drugs available, Suction available, Timeout performed and Patient being monitored Patient Re-evaluated:Patient Re-evaluated prior to inductionOxygen Delivery Method: Circle system utilized Preoxygenation: Pre-oxygenation with 100% oxygen Intubation Type: IV induction Ventilation: Mask ventilation without difficulty Grade View: Grade I Tube type: Oral Tube size: 7.0 mm Number of attempts: 1 Airway Equipment and Method: Stylet Placement Confirmation: ETT inserted through vocal cords under direct vision,  positive ETCO2 and breath sounds checked- equal and bilateral Secured at: 20 cm Tube secured with: Tape Dental Injury: Teeth and Oropharynx as per pre-operative assessment  Difficulty Due To: Difficulty was unanticipated

## 2012-06-18 NOTE — Op Note (Signed)
Preoperative diagnosis: Chronic pelvic pain with uterine prolapse and status post 2 cesarean sections  Postoperative diagnosis: Same  Anesthesia: Gen.  Anesthesiologist: Dr. Casimiro Needle foster  Procedure: Robotically assisted total hysterectomy  Surgeon: Dr. Dois Davenport Yazleemar Strassner  Assistant: Henreitta Leber PA-C  Estimated blood loss: Minimal  Procedure:  After being informed of the planned procedure with possible complications including but not limited to bleeding, infection, injury to other organs, need for laparotomy, informed consent is obtained and patient is taken to or #7. She is given general anesthesia without any complication with endotracheal intubation. She is placed in lithotomy position on a sticky mattress and beanbag with both arms padded and tucked on each side and knee-high sequential compressive devices. She is prepped and draped in a sterile fashion.  Pelvic exam reveals a retroverted normal-size uterus with no adnexa. A weighted speculum is inserted in the vagina and the anterior lip of the cervix is grasped with a tenaculum forcep. Uterus is sounded at 9 cm and easily allows placement of a #9 RUMI intrauterine manipulator. This is mounted on a vaginal occluder as well as a 3.0 KOH ring. The ring is sutured to the cervix with 0 Vicryl. A three-way Foley catheter is inserted in the bladder.  We infiltrate the umbilical area with 5 cc of Marcaine 0.25 and perform a semilateral: Incision which is brought down bluntly to the fascia. The fascia is identified and grasped with Coker forceps. It is incised with Mayo scissors. Peritoneum is entered bluntly. A pursestring suture of 0 Vicryl is placed on the fascia and a 10 mm Hassan trocar is easily inserted in the abdominal cavity and held in placed with a Purstring suture. This allows for easy insufflation of a pneumoperitoneum using warmed CO2 at a maximum pressure of 15 mm of mercury. We then placed a 8mm robotic trocar on the left, 8 8mm  robotic trocar on the right and a 5 mm patient side assistant trocar on the right all under direct position after infiltrating with Marcaine 0.25. The robot is docked on the left side of the patient after positioning her in Trendelenburg. A monopolar scissor is inserted in arm #1 and a PK gyrus forcep is inserted in arm #2. Preparation and docking is completed in 30 minutes.  Observation: Anterior cul-de-sac is normal. Posterior cul-de-sac is normal. The uterus is of normal appearance slightly retroverted. Both tubes are normal. Patient is status post bilateral salpingo-oophorectomy with we do see a long the infundibulopelvic ligament on the right trace of ovarian tissue. There is also possible ovarian tissue on the left infundibulopelvic ligament. There is no adhesions. Should the patient require further exploration for pelvic pain, itch should be fairly simple to remove these ovarian remnants. Both uterosacral ligaments are very small. Both ureters are easily visualized.  We proceed with cauterization of the right round ligament and sectioned. Cauterization of the right tubo-ovarian ligament and sectioned it. Cauterization of the right tube and sectioned. This gives [place] access into the broad ligament which is easily dissect it anteriorly all the way across the KOH ring and posteriorly keeping the ureter under direct visualization. Moving to the left side, we cauterized the left round ligament and sectioned at. We cauterized the left utero-ovarian ligament and sectioned. Cauterized the left tube and sectioned. And the anterior sheath of the broad ligament is then opened and meets the the right incision above the KOH ring. The posterior sheath of the broad ligament is brought down skeletonizing the uterine vessels. Now the bladder  is filled with 350 cc of saline to assist in dissection of the uterine vesical junction. Proceeding systematically with sharp and blunt dissection, we are able to develop the bladder  flap and retract the bladder below the KOH ring. Filling the bladder has allowed [place] to identify the area of adhesions and to safely sectioned. The bladder is then emptied and we proceed with cauterization of the uterine vessels on both sides at the level of the coring in the ascending branch of the uterine artery. We are now able to perform a colpotomy guided by the KOH ring using an open monopolar scissors. The uterus is then delivered vaginally. Instruments are modified for a suture cut in arm #1 and a long forcep and arm #2. For 0 Vicryl cut at 8 inches are then dropped into the abdominal cavity via the 12 mm umbilical trocar. These needles are rate covered and attached to the anterior abdominal wall. We then proceed with closure of the vaginal cuff using figure-of-eight stitches of 0 Vicryl. We also perform a closure of the posterior cul-de-sac placing a 0 Vicryl suture to reunite both uterosacral ligaments. This should protect against future enterocele.  We irrigated profusely with warm saline and note a satisfactory hemostasis and to the ureters with good peristaltic is him and no dilatation..  Console time is 1 hour and 45 minutes. The robot is undocked. The camera is changed for 8 mm camera allowing [place] to remove all 4 needles from the abdominal cavity. We then evaluate the rest of the abdominal anatomy which reveals a normal-appearing liver, normal appearing gallbladder and no visualization of the appendix.  Instruments and trochars are then removed after a vacuolated and the pneumoperitoneum. The umbilical incision is closed with the previously placed pursestring suture of 0 Vicryl. The skin of all 4 incisions is closed with subcuticular suture of 3-0 Monocryl and Dermabond. A speculum is inserted to evaluate the vaginal cuff which reveals a satisfactory closure. Hemostasis is adequate except for minimal oozing.  Instrument and sponge count is complete x2. The procedure is well tolerated by the  patient is taken to recovery room in a well and stable condition. Estimated blood loss is minimal.  Specimen: Uterus and cervix sent to pathology

## 2012-06-18 NOTE — Addendum Note (Signed)
Addendum  created 06/18/12 1419 by Renford Dills, CRNA   Modules edited:Notes Section

## 2012-06-18 NOTE — Anesthesia Preprocedure Evaluation (Signed)
Anesthesia Evaluation  Patient identified by MRN, date of birth, ID band Patient awake    Reviewed: Allergy & Precautions, H&P , NPO status , Patient's Chart, lab work & pertinent test results, reviewed documented beta blocker date and time   History of Anesthesia Complications Negative for: history of anesthetic complications  Airway Mallampati: I TM Distance: >3 FB Neck ROM: full    Dental  (+) Teeth Intact   Pulmonary neg pulmonary ROS,  breath sounds clear to auscultation        Cardiovascular Exercise Tolerance: Good negative cardio ROS  Rhythm:regular Rate:Normal     Neuro/Psych PSYCHIATRIC DISORDERS (PTSD, depression) negative neurological ROS     GI/Hepatic negative GI ROS, Neg liver ROS, hiatal hernia,   Endo/Other  negative endocrine ROS  Renal/GU negative Renal ROS  Female GU complaint     Musculoskeletal   Abdominal   Peds  Hematology negative hematology ROS (+)   Anesthesia Other Findings   Reproductive/Obstetrics negative OB ROS                           Anesthesia Physical Anesthesia Plan  ASA: I  Anesthesia Plan: General ETT   Post-op Pain Management:    Induction:   Airway Management Planned:   Additional Equipment:   Intra-op Plan:   Post-operative Plan:   Informed Consent: I have reviewed the patients History and Physical, chart, labs and discussed the procedure including the risks, benefits and alternatives for the proposed anesthesia with the patient or authorized representative who has indicated his/her understanding and acceptance.   Dental Advisory Given  Plan Discussed with: Surgeon and CRNA  Anesthesia Plan Comments:         Anesthesia Quick Evaluation

## 2012-06-18 NOTE — Transfer of Care (Signed)
Immediate Anesthesia Transfer of Care Note  Patient: Regina Steele  Procedure(s) Performed: Procedure(s) (LRB): ROBOTIC ASSISTED TOTAL HYSTERECTOMY (N/A)  Patient Location: PACU  Anesthesia Type: General  Level of Consciousness: awake and oriented  Airway & Oxygen Therapy: Patient Spontanous Breathing and Patient connected to nasal cannula oxygen  Post-op Assessment: Report given to PACU RN and Post -op Vital signs reviewed and stable  Post vital signs: Reviewed and stable  Complications: No apparent anesthesia complications

## 2012-06-19 ENCOUNTER — Telehealth: Payer: Self-pay | Admitting: Obstetrics and Gynecology

## 2012-06-19 NOTE — Telephone Encounter (Signed)
Called pt at home for post-op check. Reports that pain is well managed but experiencing shoulder pain as a nagging soreness. No SOB.  Diet is well tolerated and voiding is normal. No vaginal bleeding. Reviewed op findings. Recommend hot shower / bath and staying on schedule with pain meds. Pt encouraged to call for any concerns.

## 2012-06-25 ENCOUNTER — Inpatient Hospital Stay (HOSPITAL_COMMUNITY)
Admission: AD | Admit: 2012-06-25 | Discharge: 2012-06-25 | Disposition: A | Discharge status: Home / Self Care | Payer: Medicaid Other | Source: Ambulatory Visit | Attending: Obstetrics and Gynecology | Admitting: Obstetrics and Gynecology

## 2012-06-25 ENCOUNTER — Encounter (HOSPITAL_COMMUNITY): Payer: Self-pay | Admitting: *Deleted

## 2012-06-25 DIAGNOSIS — IMO0002 Reserved for concepts with insufficient information to code with codable children: Secondary | ICD-10-CM | POA: Insufficient documentation

## 2012-06-25 DIAGNOSIS — T8131XA Disruption of external operation (surgical) wound, not elsewhere classified, initial encounter: Secondary | ICD-10-CM

## 2012-06-25 NOTE — MAU Provider Note (Signed)
History     CSN: [id number]  Arrival date and time: 06/25/12 2140   None     Chief Complaint  Patient presents with  . Post-op Problem   HPI Regina Steele is 31 y.o. G3P3003 hx of laproscopic hysterectomy 06/18/12 by Dr. Estanislado Pandy.  Has done well until the past few days, "I have noticed I had a fever of 99.5 and one of my incision sites is red and puffy.  I have been really sleepy.  I feel like I am struggling with something".  Denies abdominal pain except over the the incision site on the right that is tender when clothes touch it.   Bleeding is now scabbed over.  Has appt 8/20 for post of exam.  Vance Gather is in a delivery and asked that I perform medical screening exam.      Past Medical History  Diagnosis Date  . PTSD (post-traumatic stress disorder)   . Uterine prolapse 2013  . Depression   . Hiatal hernia   . Surgical menopause   . Chronic headaches   . Constipation   . Esophageal reflux     no meds    Past Surgical History  Procedure Date  . Bilateral oophorectomy     bilat  . Rectocele repair   . Cesarean section   . Abdominal hysterectomy     Family History  Problem Relation Age of Onset  . Breast cancer Maternal Grandmother 11  . Breast cancer Paternal Aunt 40  . Celiac disease Paternal Grandmother   . Heart disease Father   . Colon cancer Neg Hx     History  Substance Use Topics  . Smoking status: Never Smoker   . Smokeless tobacco: Never Used  . Alcohol Use: No    Allergies: No Known Allergies  Prescriptions prior to admission  Medication Sig Dispense Refill  . ALPRAZolam (XANAX) 1 MG tablet Take 1 mg by mouth 4 (four) times daily as needed. For anxiety      . docusate sodium (COLACE) 100 MG capsule Take 400-700 mg by mouth daily.       [name] escitalopram (LEXAPRO) 20 MG tablet Take 20 mg by mouth at bedtime.       [name] estradiol (ESTRACE) 2 MG tablet Take 2 mg by mouth at bedtime.       [name] estradiol (VAGIFEM) 25 MCG vaginal tablet Place 25 mcg  vaginally 2 (two) times a week. Monday,thursday      . Linaclotide (LINZESS) 145 MCG CAPS Take 1 capsule by mouth daily.  30 capsule  2  . magnesium citrate 1.745 GM/30ML SOLN Take 1 Bottle by mouth daily as needed. For constipation      . naproxen (NAPROSYN) 500 MG tablet Take 500 mg by mouth 2 (two) times daily as needed. For pain      . oxyCODONE-acetaminophen (ROXICET) 5-325 MG per tablet Take 1 tablet by mouth every 4 (four) hours as needed for pain.  30 tablet  0  . risperiDONE (RISPERDAL) 1 MG tablet Take 1 mg by mouth 2 (two) times daily.       [name] topiramate (TOPAMAX) 100 MG tablet Take 100 mg by mouth 2 (two) times daily.      [name] zolpidem (AMBIEN) 10 MG tablet Take 10 mg by mouth at bedtime as needed. For insomnia      . lactulose (CHRONULAC) 10 GM/15ML solution Take 45 mLs (30 g total) by mouth as needed.  240 mL  3    ROS  Physical Exam   Blood pressure 114/70, pulse 66, temperature 97.9 F (36.6 C), temperature source Oral, resp. rate 16, height 5\' 3"  (1.6 m), weight 53.252 kg (117 lb 6.4 oz), last menstrual period 12/14/2003.  Physical Exam  MAU Course  Procedures  MDM 22:55  CNM in the unit to see patient.  Assessment and Plan    Jakim Drapeau,EVE M 06/25/2012, 10:28 PM

## 2012-06-25 NOTE — MAU Note (Signed)
Pt post lap hysterectomy 06/18/2012, fever of 99.5x 2-3 days, surgical site red and puffy.

## 2012-07-01 ENCOUNTER — Encounter: Payer: Self-pay | Admitting: Obstetrics and Gynecology

## 2012-07-01 ENCOUNTER — Ambulatory Visit (INDEPENDENT_AMBULATORY_CARE_PROVIDER_SITE_OTHER): Payer: Medicaid Other | Admitting: Obstetrics and Gynecology

## 2012-07-01 VITALS — BP 100/58 | Wt 113.0 lb

## 2012-07-01 DIAGNOSIS — R300 Dysuria: Secondary | ICD-10-CM

## 2012-07-01 DIAGNOSIS — N819 Female genital prolapse, unspecified: Secondary | ICD-10-CM

## 2012-07-01 LAB — POCT URINALYSIS DIPSTICK

## 2012-07-01 NOTE — Progress Notes (Signed)
Surgery:  Robotic Hysterectomy   Date: 06/18/2012  Eating a regular diet without difficulty. Bowel movements are normal.  Pain is controlled without any medications.  Bladder function is returned to normal. Vaginal bleeding: none Vaginal discharge: no vaginal discharge    Subjective:     Regina Steele is a 31 y.o. female who presents for post-op visit.  Pathology report reviewed with patient:  FINAL DIAGNOSIS Diagnosis Uterus and cervix - CERVIX: CHRONIC INFLAMMATION. - ENDOMETRIUM: SECRETORY TYPE ENDOMETRIUM. - DECIDUALIZED STROMA, CONSISTENT WITH HORMONE EFFECT. - MYOMETRIUM: ESSENTIALLY UNREMARKABLE. - SEROSA: ESSENTIALLY UNREMARKABLE.  The following portions of the patient's history were reviewed and updated as appropriate: allergies, current medications, past family history, past medical history, past social history, past surgical history and problem list.  Review of Systems Pertinent items are noted in HPI.   Objective:    BP 100/58  Wt 113 lb (51.256 kg)  LMP 12/14/2003 Weight:  Wt Readings from Last 1 Encounters:  07/01/12 113 lb (51.256 kg)    BMI: There is no height on file to calculate BMI.  General Appearance: Alert, appropriate appearance for age. No acute distress Lungs: clear to auscultation bilaterally Back: No CVA tenderness Cardiovascular: Regular rate and rhythm. S1, S2, no murmur Gastrointestinal: Soft, non-tender, no masses or organomegaly Incision/s: healing well Pelvic Exam: vaginal cuff healing well   Bimanual exam normal  U/A: negative  Assessment:    Doing well postoperatively. Operative findings again reviewed.   Plan:    OK to return to work 07/15/12 Follow-up 4 weeks  Zack Crager A MD 8/20/20139:52 AM

## 2012-07-07 ENCOUNTER — Telehealth: Payer: Self-pay | Admitting: Obstetrics and Gynecology

## 2012-07-07 ENCOUNTER — Encounter: Payer: Self-pay | Admitting: Obstetrics and Gynecology

## 2012-07-07 ENCOUNTER — Ambulatory Visit (INDEPENDENT_AMBULATORY_CARE_PROVIDER_SITE_OTHER): Payer: Medicaid Other | Admitting: Obstetrics and Gynecology

## 2012-07-07 VITALS — BP 102/68 | Wt 116.0 lb

## 2012-07-07 DIAGNOSIS — IMO0002 Reserved for concepts with insufficient information to code with codable children: Secondary | ICD-10-CM

## 2012-07-07 NOTE — Telephone Encounter (Signed)
Spoke with pt rgd concerns pt states had surgery last week now soaking pad and passing dime size clots offered pt an appt for eval pt has appt 07/07/12 at 2:50 pt voice understanding

## 2012-07-07 NOTE — Progress Notes (Signed)
  Current contraception: Robotic total hysterectomy 06/18/12 Hormone replacement therapy: Yes  New medication: No  History of ZOX:WRUE  History of infertility: no. History of abnormal Pap smear: no History of fibroids: No  Increased stress: Yes   Abnormal bleeding pattern started: last Thursday, changing pad every 1 hour. No pain.  Pt has been passing blood clots Voiding normally and normal BM  Subjective:    Regina Steele is a 31 y.o. female, G3P3003, who presents for post-op bleeding.   The following portions of the patient's history were reviewed and updated as appropriate: allergies, current medications, past family history.  Review of Systems Pertinent items are noted in HPI.   Objective:    BP 102/68  Wt 116 lb (52.617 kg)  LMP 12/14/2003    Weight:  Wt Readings from Last 1 Encounters:  07/07/12 116 lb (52.617 kg)          BMI: There is no height on file to calculate BMI.  General Appearance: Alert, appropriate appearance for age. No acute distress GYN exam: speculum reveals no active bleeding. Small area of granulation at mid cuff: AgNO3 applied. Pt reexamined 1 hpour later: still no bleeding.   Assessment:    vaginal cuff bleeding    Plan:    Reduce strenuous activities. Bleeding precautions given. Follow-up in 4 weeks     Gasper Hopes AMD

## 2012-07-08 ENCOUNTER — Inpatient Hospital Stay (HOSPITAL_COMMUNITY)
Admission: AD | Admit: 2012-07-08 | Discharge: 2012-07-09 | Disposition: A | Discharge status: Home / Self Care | Payer: Medicaid Other | Source: Ambulatory Visit | Attending: Obstetrics and Gynecology | Admitting: Obstetrics and Gynecology

## 2012-07-08 DIAGNOSIS — F4310 Post-traumatic stress disorder, unspecified: Secondary | ICD-10-CM | POA: Diagnosis not present

## 2012-07-08 DIAGNOSIS — F411 Generalized anxiety disorder: Secondary | ICD-10-CM | POA: Diagnosis not present

## 2012-07-08 DIAGNOSIS — Z90710 Acquired absence of both cervix and uterus: Secondary | ICD-10-CM | POA: Insufficient documentation

## 2012-07-08 DIAGNOSIS — F509 Eating disorder, unspecified: Secondary | ICD-10-CM | POA: Diagnosis not present

## 2012-07-08 DIAGNOSIS — F331 Major depressive disorder, recurrent, moderate: Secondary | ICD-10-CM | POA: Diagnosis not present

## 2012-07-08 DIAGNOSIS — R5381 Other malaise: Secondary | ICD-10-CM | POA: Insufficient documentation

## 2012-07-08 DIAGNOSIS — N898 Other specified noninflammatory disorders of vagina: Secondary | ICD-10-CM | POA: Insufficient documentation

## 2012-07-09 ENCOUNTER — Telehealth: Payer: Self-pay | Admitting: Obstetrics and Gynecology

## 2012-07-09 ENCOUNTER — Encounter (HOSPITAL_COMMUNITY): Payer: Self-pay | Admitting: *Deleted

## 2012-07-09 DIAGNOSIS — N898 Other specified noninflammatory disorders of vagina: Secondary | ICD-10-CM

## 2012-07-09 DIAGNOSIS — Z9889 Other specified postprocedural states: Secondary | ICD-10-CM

## 2012-07-09 LAB — CBC WITH DIFFERENTIAL/PLATELET
Hemoglobin: 11.2 g/dL — ABNORMAL LOW (ref 12.0–15.0)
Lymphs Abs: 2.4 10*3/uL (ref 0.7–4.0)
MCH: 29.3 pg (ref 26.0–34.0)
Monocytes Relative: 7 % (ref 3–12)
Neutro Abs: 3.4 10*3/uL (ref 1.7–7.7)
Neutrophils Relative %: 52 % (ref 43–77)
RBC: 3.82 MIL/uL — ABNORMAL LOW (ref 3.87–5.11)

## 2012-07-09 NOTE — MAU Note (Signed)
Pt states she had total vaginal hysterectomy on 06/18/3012. Pt has had some bleeding starting last Thursday 07/03/2012.pt states she was passing blood clots. Pt saw the Dr Estanislado Pandy on 07/07/2012. And the DR. Used used silver nitrate and munsels. Pt states she has been taking it easy . Bleeding started back tonight after laying down pt states she got up to use the bathroom and bleeding started back again.

## 2012-07-09 NOTE — MAU Note (Signed)
Pt reports total vaginal hysterectomy on 06/18/2012, on 07/04/2012 she began having off/on vaginal bleeding. Increased yesterday and was seen in MD's office on 07/07/2012 and they used silver nitrate and something else and stopped the bleeding. Tonight she soaked a pad in 30 minutes. Denies pain

## 2012-07-09 NOTE — Progress Notes (Signed)
Blood observed in the cervix, blood removed. Small amount of bleeding noted. Monsels applied

## 2012-07-09 NOTE — Telephone Encounter (Signed)
Triage/epic 

## 2012-07-09 NOTE — Telephone Encounter (Signed)
LMTC @ 11:15 ld

## 2012-07-09 NOTE — MAU Provider Note (Signed)
History     CSN: [id number]  Arrival date and time: 07/08/12 2358   None     Chief Complaint  Patient presents with  . Vaginal Bleeding   HPI Comments: Pt is s/p robotic hysterectomy by Dr Estanislado Pandy on 06-18-12, she was seen in the office by Dr Estanislado Pandy on 8-26 for evaluation of vaginal bleeding, no active bleeding was noted at that time and silver nitrate and monsuls solution was applied and no vaginal bleeding was noted after an hour.  Pt denies any pain, no fever, aches, chills, c/o persistent fatigue. States she has been having to take naps. Today she went to another dr appt, ran errands and had a 20 min walk. When she went to lay down she noted increased bleeding that saturated a pad, has not passed any clots.    Vaginal Bleeding      Past Medical History  Diagnosis Date  . PTSD (post-traumatic stress disorder)   . Uterine prolapse 2013  . Depression   . Hiatal hernia   . Surgical menopause   . Chronic headaches   . Constipation   . Esophageal reflux     no meds    Past Surgical History  Procedure Date  . Bilateral oophorectomy     bilat  . Rectocele repair   . Cesarean section   . Abdominal hysterectomy     Family History  Problem Relation Age of Onset  . Breast cancer Maternal Grandmother 53  . Breast cancer Paternal Aunt 40  . Celiac disease Paternal Grandmother   . Heart disease Father   . Colon cancer Neg Hx     History  Substance Use Topics  . Smoking status: Never Smoker   . Smokeless tobacco: Never Used  . Alcohol Use: No    Allergies: No Known Allergies  Prescriptions prior to admission  Medication Sig Dispense Refill  . ALPRAZolam (XANAX) 1 MG tablet Take 1 mg by mouth 4 (four) times daily as needed. For anxiety      . docusate sodium (COLACE) 100 MG capsule Take 400-700 mg by mouth daily.       [name] escitalopram (LEXAPRO) 20 MG tablet Take 20 mg by mouth at bedtime.       [name] estradiol (ESTRACE) 2 MG tablet Take 2 mg by mouth at bedtime.         [name] estradiol (VAGIFEM) 25 MCG vaginal tablet Place 25 mcg vaginally 2 (two) times a week. Monday,thursday      . lactulose (CHRONULAC) 10 GM/15ML solution Take 45 mLs (30 g total) by mouth as needed.  240 mL  3  . Linaclotide (LINZESS) 145 MCG CAPS Take 1 capsule by mouth daily.  30 capsule  2  . magnesium citrate 1.745 GM/30ML SOLN Take 1 Bottle by mouth daily as needed. For constipation      . naproxen (NAPROSYN) 500 MG tablet Take 500 mg by mouth 2 (two) times daily as needed. For pain      . risperiDONE (RISPERDAL) 1 MG tablet Take 1 mg by mouth 2 (two) times daily.       [name] topiramate (TOPAMAX) 100 MG tablet Take 100 mg by mouth 2 (two) times daily.      [name] zolpidem (AMBIEN) 10 MG tablet Take 10 mg by mouth at bedtime as needed. For insomnia        Review of Systems  Genitourinary: Positive for vaginal bleeding.       Vaginal bleeding, bright red   All  other systems reviewed and are negative.   Physical Exam   Blood pressure 113/75, pulse 66, temperature 98.1 F (36.7 C), temperature source Oral, resp. rate 20, height 5\' 2"  (1.575 m), weight 119 lb (53.978 kg), last menstrual period 12/14/2003, SpO2 100.00%.  Physical Exam  Nursing note and vitals reviewed. Constitutional: She is oriented to person, place, and time. She appears well-developed and well-nourished. No distress.  HENT:  Head: Normocephalic.  Neck: Normal range of motion.  Cardiovascular: Normal rate.   Respiratory: Effort normal.  GI: Soft. She exhibits no distension. There is no tenderness.  Genitourinary: Vaginal discharge found.       Spec exam revealed mod amt of BRB blood in vault and 4cm clot removed, bleeding appears to be coming from posterior cuff, although it was difficult to see  Musculoskeletal: Normal range of motion. She exhibits no edema.  Neurological: She is alert and oriented to person, place, and time.  Skin: Skin is warm and dry.  Psychiatric: She has a normal mood and affect. Her behavior is  normal.    MAU Course  Procedures    Assessment and Plan  S/p robotic hysterectomy on 8-7  F/u vaginal bleeding on 8-26 w monsels and silver nitrate applied Fatigue   Applied additional monsels applied to posterior vault Will check CBC and reevaluate vag bleeding in about an hour  Zymeir Salminen M 07/09/2012, 1:21 AM

## 2012-07-21 DIAGNOSIS — F411 Generalized anxiety disorder: Secondary | ICD-10-CM | POA: Diagnosis not present

## 2012-07-21 DIAGNOSIS — F509 Eating disorder, unspecified: Secondary | ICD-10-CM | POA: Diagnosis not present

## 2012-07-21 DIAGNOSIS — F331 Major depressive disorder, recurrent, moderate: Secondary | ICD-10-CM | POA: Diagnosis not present

## 2012-07-21 DIAGNOSIS — F4310 Post-traumatic stress disorder, unspecified: Secondary | ICD-10-CM | POA: Diagnosis not present

## 2012-07-29 ENCOUNTER — Ambulatory Visit (INDEPENDENT_AMBULATORY_CARE_PROVIDER_SITE_OTHER): Payer: Medicare Other | Admitting: Obstetrics and Gynecology

## 2012-07-29 ENCOUNTER — Other Ambulatory Visit: Payer: Self-pay | Admitting: Obstetrics and Gynecology

## 2012-07-29 ENCOUNTER — Encounter: Payer: Medicaid Other | Admitting: Obstetrics and Gynecology

## 2012-07-29 ENCOUNTER — Encounter: Payer: Self-pay | Admitting: Obstetrics and Gynecology

## 2012-07-29 VITALS — BP 104/62 | Temp 98.1°F | Wt 115.0 lb

## 2012-07-29 DIAGNOSIS — N814 Uterovaginal prolapse, unspecified: Secondary | ICD-10-CM

## 2012-07-29 MED ORDER — ESTRADIOL 2 MG PO TABS: 2.0000 mg | ORAL_TABLET | Freq: Every day | ORAL | Status: DC

## 2012-07-29 MED ORDER — ESTRADIOL 10 MCG VA TABS: 10.0000 ug | ORAL_TABLET | VAGINAL | Status: DC

## 2012-07-29 MED ORDER — ESTRADIOL 25 MCG VA TABS: 25.0000 ug | ORAL_TABLET | VAGINAL | Status: DC

## 2012-07-29 NOTE — Telephone Encounter (Signed)
TRIAGE/PHARM. °

## 2012-07-29 NOTE — Telephone Encounter (Signed)
TC to Regina Steele at [facility].  Questioning strength of Vagifem. Per Dr Lynford Humphrey changed to 10 mcg.

## 2012-07-29 NOTE — Progress Notes (Signed)
Surgery: Robotic Hysterectomy   Date: 06/18/2012  Eating a regular diet with difficulty. Bowel movements are normal.  The patient is not having any pain.  Bladder function is returned to normal. Vaginal bleeding: spotting  Vaginal discharge: no vaginal discharge  Subjective:     Regina Steele is a 31 y.o. female who presents for post-op visit.  Pathology report:  was reviewed with patient.  Diagnosis Uterus and cervix - CERVIX: CHRONIC INFLAMMATION. - ENDOMETRIUM: SECRETORY TYPE ENDOMETRIUM. - DECIDUALIZED STROMA, CONSISTENT WITH HORMONE EFFECT. - MYOMETRIUM: ESSENTIALLY UNREMARKABLE. - SEROSA: ESSENTIALLY UNREMARKABLE. Regina Leisure MD Pathologist, Electronic Signature (Case signed 06/20/2012)  The following portions of the patient's history were reviewed and updated as appropriate: allergies, current medications, past family history, past medical history, past social history, past surgical history and problem list.  Review of Systems Pertinent items are noted in HPI.  No more bleeding and no more pain. Feels back to normal Objective:    BP 104/62  Temp 98.1 F (36.7 C)  Wt 115 lb (52.164 kg)  LMP 12/14/2003 Weight:  Wt Readings from Last 1 Encounters:  07/29/12 115 lb (52.164 kg)    BMI: There is no height on file to calculate BMI.  General Appearance: Alert, appropriate appearance for age. No acute distress Pelvic Exam: vaginal cuff healing well   Bimanual exam normal  Assessment:    Doing well postoperatively. Operative findings again reviewed.   Plan:    Follow-up in 1 year for AEX Estradiol and vagifem refilled  Regina Poche MD 9/17/20138:58 AM

## 2012-08-01 ENCOUNTER — Encounter (HOSPITAL_COMMUNITY): Payer: Self-pay | Admitting: Emergency Medicine

## 2012-08-01 ENCOUNTER — Emergency Department (HOSPITAL_COMMUNITY)
Admission: EM | Admit: 2012-08-01 | Discharge: 2012-08-01 | Disposition: A | Discharge status: Home / Self Care | Payer: Medicaid Other | Attending: Emergency Medicine | Admitting: Emergency Medicine

## 2012-08-01 DIAGNOSIS — K219 Gastro-esophageal reflux disease without esophagitis: Secondary | ICD-10-CM | POA: Insufficient documentation

## 2012-08-01 DIAGNOSIS — F4310 Post-traumatic stress disorder, unspecified: Secondary | ICD-10-CM | POA: Insufficient documentation

## 2012-08-01 DIAGNOSIS — J329 Chronic sinusitis, unspecified: Secondary | ICD-10-CM | POA: Insufficient documentation

## 2012-08-01 MED ORDER — AMOXICILLIN-POT CLAVULANATE 875-125 MG PO TABS: 1.0000 | ORAL_TABLET | Freq: Two times a day (BID) | ORAL | Status: DC

## 2012-08-01 MED ORDER — GUAIFENESIN ER 1200 MG PO TB12: 1.0000 | ORAL_TABLET | Freq: Two times a day (BID) | ORAL | Status: DC

## 2012-08-01 NOTE — ED Notes (Signed)
Pt presenting to ed with c/o sinus pain and pressure x 1 week pt states she has post-nasal drip also. Pt states she tends to get sinus infections when the weather changes. Pt states the pressure is causing her to have dizziness. Pt denies chest pain, shortness of breath and nausea and vomiting at this time

## 2012-08-01 NOTE — ED Provider Notes (Signed)
Medical screening examination/treatment/procedure(s) were performed by non-physician practitioner and as supervising physician I was immediately available for consultation/collaboration.   Gerhard Munch, MD 08/01/12 1525

## 2012-08-01 NOTE — ED Provider Notes (Signed)
History     CSN: [id number]  Arrival date & time 08/01/12  1304   First MD Initiated Contact with Patient 08/01/12 1339      Chief Complaint  Patient presents with  . Facial Pain    (Consider location/radiation/quality/duration/timing/severity/associated sxs/prior treatment) HPI Patient presents emergency department with sinus pressure and pain for the last 2 weeks.  Patient said she has yellowish discharge from her nose.  Patient denies fevers, nausea, vomiting, diarrhea, cough, sore throat, visual changes, headache, weakness, or numbness.  Patient, states she does have some dizziness, over the last several days.  Patient denies taking anything prior to arrival, for her symptoms. Past Medical History  Diagnosis Date  . PTSD (post-traumatic stress disorder)   . Uterine prolapse 2013  . Depression   . Hiatal hernia   . Surgical menopause   . Chronic headaches   . Constipation   . Esophageal reflux     no meds    Past Surgical History  Procedure Date  . Bilateral oophorectomy     bilat  . Rectocele repair   . Cesarean section   . Abdominal hysterectomy     Family History  Problem Relation Age of Onset  . Breast cancer Maternal Grandmother 14  . Breast cancer Paternal Aunt 40  . Celiac disease Paternal Grandmother   . Heart disease Father   . Colon cancer Neg Hx     History  Substance Use Topics  . Smoking status: Never Smoker   . Smokeless tobacco: Never Used  . Alcohol Use: No    OB History    Grav Para Term Preterm Abortions TAB SAB Ect Mult Living   3 3 3       3       Review of Systems All other systems negative except as documented in the HPI. All pertinent positives and negatives as reviewed in the HPI.  Allergies  Review of patient's allergies indicates no known allergies.  Home Medications   Current Outpatient Rx  Name Route Sig Dispense Refill  . ALPRAZOLAM 1 MG PO TABS Oral Take 1 mg by mouth 4 (four) times daily as needed. For anxiety      . DOCUSATE SODIUM 100 MG PO CAPS Oral Take 200 mg by mouth at bedtime.     [name] ESCITALOPRAM OXALATE 20 MG PO TABS Oral Take 20 mg by mouth at bedtime.     [name] ESTRADIOL 2 MG PO TABS Oral Take 1 tablet (2 mg total) by mouth at bedtime. 30 tablet 11  . ESTRADIOL 10 MCG VA TABS Vaginal Place 1 tablet (10 mcg total) vaginally 2 (two) times a week. One tablet vaginally 2 x per week. Monday and Thursday. 24 tablet 11  . LINACLOTIDE 145 MCG PO CAPS Oral Take 1 capsule by mouth daily. 30 capsule 2  . MAGNESIUM CITRATE PO SOLN Oral Take 1 Bottle by mouth daily as needed. For constipation    . NAPROXEN 500 MG PO TABS Oral Take 500 mg by mouth 2 (two) times daily as needed. For pain    . RISPERIDONE 1 MG PO TABS Oral Take 1 mg by mouth 2 (two) times daily.     . TOPIRAMATE 100 MG PO TABS Oral Take 100 mg by mouth 2 (two) times daily.    [name] ZOLPIDEM TARTRATE 10 MG PO TABS Oral Take 10 mg by mouth at bedtime as needed. For insomnia      BP 103/68  Pulse 62  Temp 98.7 F (37.1  C) (Oral)  Resp 20  SpO2 100%  LMP 12/14/2003  Physical Exam  Nursing note and vitals reviewed. Constitutional: She is oriented to person, place, and time. She appears well-developed and well-nourished. No distress.  HENT:  Head: Normocephalic and atraumatic.  Nose: Mucosal edema and rhinorrhea present. Right sinus exhibits maxillary sinus tenderness and frontal sinus tenderness. Left sinus exhibits maxillary sinus tenderness and frontal sinus tenderness.  Mouth/Throat: Oropharynx is clear and moist.  Eyes: Conjunctivae normal are normal. Pupils are equal, round, and reactive to light. Right eye exhibits no discharge. Left eye exhibits no discharge.  Cardiovascular: Normal rate, regular rhythm and normal heart sounds.   Pulmonary/Chest: Effort normal and breath sounds normal.  Neurological: She is alert and oriented to person, place, and time.  Skin: Skin is warm and dry. No rash noted.    ED Course  Procedures (including  critical care time)  Patient be treated for sinusitis, based on her history of present illness  And Physical exam findings.  Patient is advised to increase her fluid intake.  Told to return here as needed MDM          Carlyle Dolly, PA-C 08/01/12 1422

## 2012-08-04 DIAGNOSIS — F509 Eating disorder, unspecified: Secondary | ICD-10-CM | POA: Diagnosis not present

## 2012-08-04 DIAGNOSIS — F411 Generalized anxiety disorder: Secondary | ICD-10-CM | POA: Diagnosis not present

## 2012-08-04 DIAGNOSIS — F4310 Post-traumatic stress disorder, unspecified: Secondary | ICD-10-CM | POA: Diagnosis not present

## 2012-08-04 DIAGNOSIS — F331 Major depressive disorder, recurrent, moderate: Secondary | ICD-10-CM | POA: Diagnosis not present

## 2012-08-20 DIAGNOSIS — F4310 Post-traumatic stress disorder, unspecified: Secondary | ICD-10-CM | POA: Diagnosis not present

## 2012-08-20 DIAGNOSIS — F411 Generalized anxiety disorder: Secondary | ICD-10-CM | POA: Diagnosis not present

## 2012-08-20 DIAGNOSIS — F509 Eating disorder, unspecified: Secondary | ICD-10-CM | POA: Diagnosis not present

## 2012-08-20 DIAGNOSIS — F331 Major depressive disorder, recurrent, moderate: Secondary | ICD-10-CM | POA: Diagnosis not present

## 2012-08-27 ENCOUNTER — Ambulatory Visit: Payer: Medicaid Other | Admitting: Obstetrics and Gynecology

## 2012-09-08 DIAGNOSIS — F331 Major depressive disorder, recurrent, moderate: Secondary | ICD-10-CM | POA: Diagnosis not present

## 2012-09-08 DIAGNOSIS — F509 Eating disorder, unspecified: Secondary | ICD-10-CM | POA: Diagnosis not present

## 2012-09-08 DIAGNOSIS — F4310 Post-traumatic stress disorder, unspecified: Secondary | ICD-10-CM | POA: Diagnosis not present

## 2012-09-08 DIAGNOSIS — F411 Generalized anxiety disorder: Secondary | ICD-10-CM | POA: Diagnosis not present

## 2012-09-09 ENCOUNTER — Telehealth: Payer: Self-pay

## 2012-09-09 NOTE — Telephone Encounter (Signed)
Pt notified that she needs to come back in to sign papers for medicaid.  ld

## 2012-09-29 DIAGNOSIS — F331 Major depressive disorder, recurrent, moderate: Secondary | ICD-10-CM | POA: Diagnosis not present

## 2012-10-04 DIAGNOSIS — F332 Major depressive disorder, recurrent severe without psychotic features: Secondary | ICD-10-CM | POA: Diagnosis not present

## 2012-10-05 DIAGNOSIS — F332 Major depressive disorder, recurrent severe without psychotic features: Secondary | ICD-10-CM | POA: Diagnosis not present

## 2012-10-06 DIAGNOSIS — F332 Major depressive disorder, recurrent severe without psychotic features: Secondary | ICD-10-CM | POA: Diagnosis not present

## 2012-10-07 DIAGNOSIS — F332 Major depressive disorder, recurrent severe without psychotic features: Secondary | ICD-10-CM | POA: Diagnosis not present

## 2012-10-14 DIAGNOSIS — F4310 Post-traumatic stress disorder, unspecified: Secondary | ICD-10-CM | POA: Diagnosis not present

## 2012-10-14 DIAGNOSIS — F509 Eating disorder, unspecified: Secondary | ICD-10-CM | POA: Diagnosis not present

## 2012-10-14 DIAGNOSIS — F331 Major depressive disorder, recurrent, moderate: Secondary | ICD-10-CM | POA: Diagnosis not present

## 2012-10-14 DIAGNOSIS — F411 Generalized anxiety disorder: Secondary | ICD-10-CM | POA: Diagnosis not present

## 2012-10-16 DIAGNOSIS — F331 Major depressive disorder, recurrent, moderate: Secondary | ICD-10-CM | POA: Diagnosis not present

## 2012-10-22 DIAGNOSIS — F331 Major depressive disorder, recurrent, moderate: Secondary | ICD-10-CM | POA: Diagnosis not present

## 2012-10-28 DIAGNOSIS — F331 Major depressive disorder, recurrent, moderate: Secondary | ICD-10-CM | POA: Diagnosis not present

## 2012-10-28 DIAGNOSIS — F411 Generalized anxiety disorder: Secondary | ICD-10-CM | POA: Diagnosis not present

## 2012-10-28 DIAGNOSIS — F4310 Post-traumatic stress disorder, unspecified: Secondary | ICD-10-CM | POA: Diagnosis not present

## 2012-10-28 DIAGNOSIS — F509 Eating disorder, unspecified: Secondary | ICD-10-CM | POA: Diagnosis not present

## 2012-10-29 DIAGNOSIS — F331 Major depressive disorder, recurrent, moderate: Secondary | ICD-10-CM | POA: Diagnosis not present

## 2012-11-17 DIAGNOSIS — F331 Major depressive disorder, recurrent, moderate: Secondary | ICD-10-CM | POA: Diagnosis not present

## 2012-11-18 DIAGNOSIS — F4310 Post-traumatic stress disorder, unspecified: Secondary | ICD-10-CM | POA: Diagnosis not present

## 2012-11-18 DIAGNOSIS — F331 Major depressive disorder, recurrent, moderate: Secondary | ICD-10-CM | POA: Diagnosis not present

## 2012-11-18 DIAGNOSIS — F411 Generalized anxiety disorder: Secondary | ICD-10-CM | POA: Diagnosis not present

## 2012-11-24 DIAGNOSIS — F331 Major depressive disorder, recurrent, moderate: Secondary | ICD-10-CM | POA: Diagnosis not present

## 2012-12-01 DIAGNOSIS — F331 Major depressive disorder, recurrent, moderate: Secondary | ICD-10-CM | POA: Diagnosis not present

## 2012-12-16 DIAGNOSIS — F509 Eating disorder, unspecified: Secondary | ICD-10-CM | POA: Diagnosis not present

## 2012-12-16 DIAGNOSIS — F411 Generalized anxiety disorder: Secondary | ICD-10-CM | POA: Diagnosis not present

## 2012-12-16 DIAGNOSIS — F4310 Post-traumatic stress disorder, unspecified: Secondary | ICD-10-CM | POA: Diagnosis not present

## 2012-12-16 DIAGNOSIS — F331 Major depressive disorder, recurrent, moderate: Secondary | ICD-10-CM | POA: Diagnosis not present

## 2012-12-23 ENCOUNTER — Encounter (HOSPITAL_COMMUNITY): Payer: Self-pay | Admitting: Physical Medicine and Rehabilitation

## 2012-12-23 ENCOUNTER — Emergency Department (HOSPITAL_COMMUNITY)
Admission: EM | Admit: 2012-12-23 | Discharge: 2012-12-23 | Disposition: A | Discharge status: Home / Self Care | Payer: Medicare Other | Attending: Emergency Medicine | Admitting: Emergency Medicine

## 2012-12-23 DIAGNOSIS — Z791 Long term (current) use of non-steroidal anti-inflammatories (NSAID): Secondary | ICD-10-CM | POA: Diagnosis not present

## 2012-12-23 DIAGNOSIS — R5381 Other malaise: Secondary | ICD-10-CM | POA: Insufficient documentation

## 2012-12-23 DIAGNOSIS — E876 Hypokalemia: Secondary | ICD-10-CM | POA: Insufficient documentation

## 2012-12-23 DIAGNOSIS — Z79899 Other long term (current) drug therapy: Secondary | ICD-10-CM | POA: Insufficient documentation

## 2012-12-23 DIAGNOSIS — R5383 Other fatigue: Secondary | ICD-10-CM

## 2012-12-23 DIAGNOSIS — E860 Dehydration: Secondary | ICD-10-CM | POA: Insufficient documentation

## 2012-12-23 DIAGNOSIS — R42 Dizziness and giddiness: Secondary | ICD-10-CM | POA: Diagnosis not present

## 2012-12-23 DIAGNOSIS — F4310 Post-traumatic stress disorder, unspecified: Secondary | ICD-10-CM | POA: Diagnosis not present

## 2012-12-23 DIAGNOSIS — F329 Major depressive disorder, single episode, unspecified: Secondary | ICD-10-CM | POA: Diagnosis not present

## 2012-12-23 DIAGNOSIS — Z8719 Personal history of other diseases of the digestive system: Secondary | ICD-10-CM | POA: Insufficient documentation

## 2012-12-23 DIAGNOSIS — Z8742 Personal history of other diseases of the female genital tract: Secondary | ICD-10-CM | POA: Diagnosis not present

## 2012-12-23 DIAGNOSIS — F3289 Other specified depressive episodes: Secondary | ICD-10-CM | POA: Insufficient documentation

## 2012-12-23 DIAGNOSIS — F331 Major depressive disorder, recurrent, moderate: Secondary | ICD-10-CM | POA: Diagnosis not present

## 2012-12-23 LAB — CBC WITH DIFFERENTIAL/PLATELET
Eosinophils Absolute: 0.1 10*3/uL (ref 0.0–0.7)
Eosinophils Relative: 1 % (ref 0–5)
Hemoglobin: 12.8 g/dL (ref 12.0–15.0)
Lymphs Abs: 2 10*3/uL (ref 0.7–4.0)
MCH: 30.6 pg (ref 26.0–34.0)
MCV: 88.5 fL (ref 78.0–100.0)
Monocytes Relative: 5 % (ref 3–12)
RBC: 4.18 MIL/uL (ref 3.87–5.11)

## 2012-12-23 LAB — COMPREHENSIVE METABOLIC PANEL
Alkaline Phosphatase: 36 U/L — ABNORMAL LOW (ref 39–117)
BUN: 9 mg/dL (ref 6–23)
Calcium: 9.1 mg/dL (ref 8.4–10.5)
GFR calc Af Amer: >90 mL/min (ref >90)
Glucose, Bld: 100 mg/dL — ABNORMAL HIGH (ref 70–99)
Total Protein: 6.9 g/dL (ref 6.0–8.3)

## 2012-12-23 LAB — URINALYSIS, ROUTINE W REFLEX MICROSCOPIC
Ketones, ur: NEGATIVE mg/dL
Leukocytes, UA: NEGATIVE
Nitrite: NEGATIVE
Specific Gravity, Urine: 1.003 — ABNORMAL LOW (ref 1.005–1.030)
pH: 6.5 (ref 5.0–8.0)

## 2012-12-23 MED ORDER — SODIUM CHLORIDE 0.9 % IV BOLUS (SEPSIS)
1000.0000 mL | Freq: Once | INTRAVENOUS | Status: AC
  Administered 2012-12-23: 1000 mL via INTRAVENOUS

## 2012-12-23 MED ORDER — POTASSIUM CHLORIDE CRYS ER 20 MEQ PO TBCR
40.0000 meq | EXTENDED_RELEASE_TABLET | Freq: Once | ORAL | Status: AC
  Administered 2012-12-23: 40 meq via ORAL
  Filled 2012-12-23: qty 2

## 2012-12-23 NOTE — ED Provider Notes (Signed)
History     CSN: [id number]  Arrival date & time 12/23/12  1747   First MD Initiated Contact with Patient 12/23/12 1922      Chief Complaint  Patient presents with  . Dizziness  . Fatigue    (Consider location/radiation/quality/duration/timing/severity/associated sxs/prior treatment) HPI Comments: 32 y/o F h/o PTSD, depression, eating disorder p/w generalized fatigue x3 days and some light headedness today. Patient states she has chronic eating disorder. Fatigue x3 days. Has taken "20 cups of coffee" today 2/2 fatigue. This afternoon with some LH while standing and walking. Lasts <5 minutes. No vertigo. Improved with sitting and relaxing. occurs intermittently. No chest pain or palpitations. No fevers or neck pain. Denies headache. Patient is a 32 y.o. female presenting with general illness. The history is provided by the patient.  Illness  The current episode started 3 to 5 days ago. The onset was gradual. The problem occurs continuously. The problem has been gradually worsening. The problem is moderate. Nothing relieves the symptoms. Nothing aggravates the symptoms. Associated symptoms include headaches (earlier in week. not currently). Pertinent negatives include no fever, no abdominal pain, no diarrhea, no nausea, no vomiting, no congestion, no rhinorrhea, no cough, no rash and no eye pain.    Past Medical History  Diagnosis Date  . PTSD (post-traumatic stress disorder)   . Uterine prolapse 2013  . Depression   . Hiatal hernia   . Surgical menopause   . Chronic headaches   . Constipation   . Esophageal reflux     no meds    Past Surgical History  Procedure Laterality Date  . Bilateral oophorectomy      bilat  . Rectocele repair    . Cesarean section    . Abdominal hysterectomy      Family History  Problem Relation Age of Onset  . Breast cancer Maternal Grandmother 64  . Breast cancer Paternal Aunt 40  . Celiac disease Paternal Grandmother   . Heart disease Father    . Colon cancer Neg Hx     History  Substance Use Topics  . Smoking status: Never Smoker   . Smokeless tobacco: Never Used  . Alcohol Use: No    OB History   Grav Para Term Preterm Abortions TAB SAB Ect Mult Living   3 3 3       3       Review of Systems  Constitutional: Negative for fever and chills.  HENT: Negative for congestion and rhinorrhea.   Eyes: Negative for pain and visual disturbance.  Respiratory: Negative for cough and shortness of breath.   Cardiovascular: Negative for chest pain, palpitations and leg swelling.  Gastrointestinal: Negative for nausea, vomiting, abdominal pain and diarrhea.  Genitourinary: Negative for dysuria, hematuria, flank pain and difficulty urinating.  Musculoskeletal: Negative for back pain.  Skin: Negative for color change and rash.  Neurological: Positive for light-headedness and headaches (earlier in week. not currently). Negative for dizziness, seizures and speech difficulty.  All other systems reviewed and are negative.    Allergies  Review of patient's allergies indicates no known allergies.  Home Medications   Current Outpatient Rx  Name  Route  Sig  Dispense  Refill  . ALPRAZolam (XANAX) 1 MG tablet   Oral   Take 1 mg by mouth 4 (four) times daily as needed. For anxiety         . Estradiol 10 MCG TABS   Vaginal   Place 1 tablet (10 mcg total) vaginally 2 (two)  times a week. One tablet vaginally 2 x per week. Monday and Thursday.   24 tablet   11   . FLUoxetine (PROZAC) 40 MG capsule   Oral   Take 40 mg by mouth daily.         . magnesium citrate 1.745 GM/30ML SOLN   Oral   Take 1 Bottle by mouth daily as needed. For constipation         . naproxen (NAPROSYN) 500 MG tablet   Oral   Take 500 mg by mouth 2 (two) times daily as needed. For pain         . QUEtiapine (SEROQUEL) 50 MG tablet   Oral   Take 50 mg by mouth at bedtime.         . risperiDONE (RISPERDAL) 1 MG tablet   Oral   Take 2 mg by  mouth 2 (two) times daily.          [name] topiramate (TOPAMAX) 100 MG tablet   Oral   Take 100 mg by mouth 2 (two) times daily.         [name] docusate sodium (COLACE) 100 MG capsule   Oral   Take 200 mg by mouth at bedtime.            BP 104/71  Pulse 68  Temp(Src) 98.4 F (36.9 C) (Oral)  Resp 18  SpO2 99%  LMP 12/14/2003  Physical Exam  Nursing note and vitals reviewed. Constitutional: She is oriented to person, place, and time. She appears well-developed and well-nourished. No distress.  HENT:  Head: Normocephalic and atraumatic.  Eyes: Conjunctivae are normal. Right eye exhibits no discharge. Left eye exhibits no discharge.  Neck: No tracheal deviation present.  Cardiovascular: Normal heart sounds and intact distal pulses.   Pulmonary/Chest: Effort normal and breath sounds normal. No stridor. No respiratory distress. She has no wheezes. She has no rales.  Abdominal: Soft. She exhibits no distension. There is no tenderness. There is no guarding.  Musculoskeletal: She exhibits no edema and no tenderness.  Neurological: She is alert and oriented to person, place, and time. She has normal strength. No cranial nerve deficit or sensory deficit. Coordination normal. GCS eye subscore is 4. GCS verbal subscore is 5. GCS motor subscore is 6.  Skin: Skin is warm and dry.  Psychiatric: She has a normal mood and affect. Her behavior is normal.    ED Course  Procedures (including critical care time)  Labs Reviewed  COMPREHENSIVE METABOLIC PANEL - Abnormal; Notable for the following:    Sodium 132 (*)    Potassium 3.2 (*)    Glucose, Bld 100 (*)    Alkaline Phosphatase 36 (*)    All other components within normal limits  URINALYSIS, ROUTINE W REFLEX MICROSCOPIC - Abnormal; Notable for the following:    Specific Gravity, Urine 1.003 (*)    All other components within normal limits  CBC WITH DIFFERENTIAL   No results found.   1. Fatigue   2. Dizziness   3. Hypokalemia   4.  Dehydration       MDM   32 y/o F p/w light headedness.  HDS, af. NAD. Neuro intact. Not orthostatic. Labs as above. Given NS bolus and potassium Patient reports resolution of symptoms No CP or palpitations. Doubt cardiac abnormality. Patient discharged home. Return precautions given. To follow up with pcp. patient in agreement with plan.  Labs and imaging reviewed by myself and considered in medical decision making if ordered. Imaging  interpreted by radiology.   Discussed case with Dr. Silverio Lay who is in agreement with assessment and plan.          Stevie Kern, MD 12/23/12 [phone]

## 2012-12-23 NOTE — ED Notes (Signed)
Pt st's she has had a eating disorder since age 32.  St's he is starting to feel bad from the effects of not eating.  Pt st's she does not throw up often she just doesn't eat but drinks a lot of water.

## 2012-12-23 NOTE — ED Notes (Signed)
Pt presents to department for evaluation of generalized weakness and dizziness. Ongoing for several days. Pt states she feels more weak than normal today. Does have eating generalized eating disorder and states she has been very anxious lately. Denies pain. Pt is alert and oriented x4. Also states she feels nauseated and has headache.

## 2012-12-24 NOTE — ED Provider Notes (Signed)
I have supervised the resident on the management of this patient and agree with the note above. I personally interviewed and examined the patient and my addendum is below.   Regina Steele is a 32 y.o. female hx of PTSD, eating disorder here with weakness and fatigue. Weakness and lightheadedness for 3 days. Dec PO intake. Well appearing, vitals stable. Labs unremarkable except for mild hypokalemia that was replaced. Felt better after fluids. I encouraged her to eat more and stay hydrated.    Richardean Canal, MD 12/24/12 2156

## 2012-12-28 ENCOUNTER — Telehealth (HOSPITAL_COMMUNITY): Payer: Self-pay | Admitting: *Deleted

## 2012-12-28 ENCOUNTER — Emergency Department (HOSPITAL_COMMUNITY)
Admission: EM | Admit: 2012-12-28 | Discharge: 2012-12-28 | Disposition: A | Discharge status: Home / Self Care | Payer: Medicare Other | Attending: Emergency Medicine | Admitting: Emergency Medicine

## 2012-12-28 ENCOUNTER — Encounter (HOSPITAL_COMMUNITY): Payer: Self-pay | Admitting: Emergency Medicine

## 2012-12-28 DIAGNOSIS — R630 Anorexia: Secondary | ICD-10-CM | POA: Diagnosis not present

## 2012-12-28 DIAGNOSIS — Z8739 Personal history of other diseases of the musculoskeletal system and connective tissue: Secondary | ICD-10-CM | POA: Diagnosis not present

## 2012-12-28 DIAGNOSIS — Z8679 Personal history of other diseases of the circulatory system: Secondary | ICD-10-CM | POA: Diagnosis not present

## 2012-12-28 DIAGNOSIS — Z8719 Personal history of other diseases of the digestive system: Secondary | ICD-10-CM | POA: Insufficient documentation

## 2012-12-28 DIAGNOSIS — Z8742 Personal history of other diseases of the female genital tract: Secondary | ICD-10-CM | POA: Insufficient documentation

## 2012-12-28 DIAGNOSIS — Z8659 Personal history of other mental and behavioral disorders: Secondary | ICD-10-CM | POA: Insufficient documentation

## 2012-12-28 DIAGNOSIS — F329 Major depressive disorder, single episode, unspecified: Secondary | ICD-10-CM | POA: Insufficient documentation

## 2012-12-28 DIAGNOSIS — Z79899 Other long term (current) drug therapy: Secondary | ICD-10-CM | POA: Insufficient documentation

## 2012-12-28 DIAGNOSIS — F3289 Other specified depressive episodes: Secondary | ICD-10-CM | POA: Insufficient documentation

## 2012-12-28 HISTORY — DX: Other specified disorders of bone density and structure, unspecified site: M85.80

## 2012-12-28 LAB — CBC WITH DIFFERENTIAL/PLATELET
Basophils Absolute: 0 10*3/uL (ref 0.0–0.1)
Basophils Relative: 1 % (ref 0–1)
Eosinophils Absolute: 0.1 K/uL (ref 0.0–0.7)
Eosinophils Relative: 1 % (ref 0–5)
HCT: 38.3 % (ref 36.0–46.0)
Hemoglobin: 13 g/dL (ref 12.0–15.0)
Lymphocytes Relative: 37 % (ref 12–46)
Lymphs Abs: 1.8 K/uL (ref 0.7–4.0)
MCH: 30.4 pg (ref 26.0–34.0)
MCHC: 33.9 g/dL (ref 30.0–36.0)
MCV: 89.7 fL (ref 78.0–100.0)
Monocytes Absolute: 0.4 K/uL (ref 0.1–1.0)
Monocytes Relative: 8 % (ref 3–12)
Neutro Abs: 2.5 10*3/uL (ref 1.7–7.7)
Neutrophils Relative %: 52 % (ref 43–77)
Platelets: 251 10*3/uL (ref 150–400)
RBC: 4.27 MIL/uL (ref 3.87–5.11)
RDW: 12.6 % (ref 11.5–15.5)
WBC: 4.7 K/uL (ref 4.0–10.5)

## 2012-12-28 LAB — POCT I-STAT, CHEM 8
Creatinine, Ser: 0.9 mg/dL (ref 0.50–1.10)
HCT: 38 % (ref 36.0–46.0)
Hemoglobin: 12.9 g/dL (ref 12.0–15.0)
Potassium: 3.9 mEq/L (ref 3.5–5.1)
Sodium: 140 mEq/L (ref 135–145)

## 2012-12-28 LAB — SALICYLATE LEVEL: Salicylate Lvl: <2 mg/dL — ABNORMAL LOW (ref 2.8–20.0)

## 2012-12-28 NOTE — ED Notes (Signed)
Poison control notified: risks: affects kidneys, upset stomach, bleeding, false + elevated bilirubin. Recommends chem panel evaluate kidney function.

## 2012-12-28 NOTE — ED Notes (Addendum)
Pt c/o feeling fatigue and weakness onset yesterday. Pt seen here recently this week and told she had low potassium. Pt admits to having eating disorder. Pt had 6 nutrient bars today. Pt had 4 aleve this morning and 4 aleve this afternoon.

## 2012-12-29 NOTE — ED Provider Notes (Signed)
History     CSN: [id number]  Arrival date & time 12/28/12  1813   First MD Initiated Contact with Patient 12/28/12 1957      Chief Complaint  Patient presents with  . Fatigue  . Weakness    HPI Regina Steele is a 32 y.o. female who presents to the ED with concern for an electrolyte abnormality.  Patient reports that she has been fatigued over last three days. Was seen here last week and had low potassium as she is diagnosed with anorexia.  Potassium replaced and patient sent home.  Since then she has still been weak.  Not eating very well.  She was concerned that this could again be the problem.  No headaches.  No muscle aches.  No other symptoms.  Past Medical History  Diagnosis Date  . PTSD (post-traumatic stress disorder)   . Uterine prolapse 2013  . Depression   . Hiatal hernia   . Surgical menopause   . Chronic headaches   . Constipation   . Esophageal reflux     no meds  . Osteopenia     Past Surgical History  Procedure Laterality Date  . Bilateral oophorectomy      bilat  . Rectocele repair    . Cesarean section    . Abdominal hysterectomy      Family History  Problem Relation Age of Onset  . Breast cancer Maternal Grandmother 91  . Breast cancer Paternal Aunt 40  . Celiac disease Paternal Grandmother   . Heart disease Father   . Colon cancer Neg Hx     History  Substance Use Topics  . Smoking status: Never Smoker   . Smokeless tobacco: Never Used  . Alcohol Use: No    OB History   Grav Para Term Preterm Abortions TAB SAB Ect Mult Living   3 3 3       3       Review of Systems  Constitutional: Positive for fatigue. Negative for fever and chills.  HENT: Negative for congestion, rhinorrhea, neck pain and neck stiffness.   Respiratory: Negative for cough and shortness of breath.   Cardiovascular: Negative for chest pain.  Gastrointestinal: Negative for nausea, vomiting, abdominal pain, diarrhea and abdominal distention.  Endocrine: Negative  for polyuria.  Genitourinary: Negative for dysuria.  Skin: Negative for rash.  Neurological: Negative for headaches.  Psychiatric/Behavioral: Negative.   All other systems reviewed and are negative.    Allergies  Review of patient's allergies indicates no known allergies.  Home Medications   Current Outpatient Rx  Name  Route  Sig  Dispense  Refill  . ALPRAZolam (XANAX) 1 MG tablet   Oral   Take 1 mg by mouth 4 (four) times daily as needed. For anxiety         . docusate sodium (COLACE) 100 MG capsule   Oral   Take 200 mg by mouth at bedtime.          . Estradiol 10 MCG TABS   Vaginal   Place 1 tablet (10 mcg total) vaginally 2 (two) times a week. One tablet vaginally 2 x per week. Monday and Thursday.   24 tablet   11   . FLUoxetine (PROZAC) 20 MG tablet   Oral   Take 20 mg by mouth 3 (three) times daily.         . QUEtiapine (SEROQUEL) 50 MG tablet   Oral   Take 50 mg by mouth at bedtime.         [name]  risperiDONE (RISPERDAL) 1 MG tablet   Oral   Take 2 mg by mouth 2 (two) times daily.          [name] topiramate (TOPAMAX) 100 MG tablet   Oral   Take 100 mg by mouth 2 (two) times daily.           BP 106/96  Pulse 67  Temp(Src) 98.3 F (36.8 C) (Oral)  Resp 17  SpO2 97%  LMP 12/14/2003  Physical Exam  Nursing note and vitals reviewed. Constitutional: She is oriented to person, place, and time. She appears well-developed and well-nourished. No distress.  HENT:  Head: Normocephalic and atraumatic.  Right Ear: External ear normal.  Left Ear: External ear normal.  Nose: Nose normal.  Mouth/Throat: Oropharynx is clear and moist. No oropharyngeal exudate.  Eyes: EOM are normal. Pupils are equal, round, and reactive to light.  Neck: Normal range of motion. Neck supple. No tracheal deviation present.  Cardiovascular: Normal rate.   Pulmonary/Chest: Effort normal and breath sounds normal. No stridor. No respiratory distress. She has no wheezes. She has no  rales.  Abdominal: Soft. She exhibits no distension. There is no tenderness. There is no rebound.  Musculoskeletal: Normal range of motion.  Neurological: She is alert and oriented to person, place, and time.  Skin: Skin is warm and dry. She is not diaphoretic.    ED Course  Procedures (including critical care time)  Labs Reviewed  SALICYLATE LEVEL - Abnormal; Notable for the following:    Salicylate Lvl <2.0 (*)    All other components within normal limits  POCT I-STAT, CHEM 8 - Abnormal; Notable for the following:    Calcium, Ion 1.26 (*)    All other components within normal limits  CBC WITH DIFFERENTIAL   No results found.   1. Anorexia     MDM   Regina Steele is a 32 y.o. female who was brought to the ED for concern of weekness and fatigue.  Electrolytes WNL.  Feel that this is likely 2/2 her malnoutrition from anorexia.  Patient thin on exam but not dangerously so.  Expressed concern for her mental health and she reports good f/u options and counseling as well as involvement with future inpatient admission at eating d/o clinic at [facility].  Patient with good insight.  No evidence of concurrent illness.  Patient safe for discharge.  Patient discharged.       Arloa Koh, MD 12/29/12 [phone]

## 2012-12-31 NOTE — ED Provider Notes (Signed)
I saw and evaluated the patient, reviewed the resident's note and I agree with the findings and plan.  Pt with h/o eating disorder presents with malaise, concerned for electrolyte abn.  No CP, fever.  Denies severe pain, HA, rash, stiff neck, cough, bloody stools.  Labs are unremarkable, pt is satisfied specifically that electrolytes are not sig abn.  Will dc home to follow up with pcp.    Regina Steele. Mysti Haley, MD 12/31/12 1052

## 2013-01-08 DIAGNOSIS — F331 Major depressive disorder, recurrent, moderate: Secondary | ICD-10-CM | POA: Diagnosis not present

## 2013-01-08 DIAGNOSIS — F4310 Post-traumatic stress disorder, unspecified: Secondary | ICD-10-CM | POA: Diagnosis not present

## 2013-01-08 DIAGNOSIS — F411 Generalized anxiety disorder: Secondary | ICD-10-CM | POA: Diagnosis not present

## 2013-01-08 DIAGNOSIS — F509 Eating disorder, unspecified: Secondary | ICD-10-CM | POA: Diagnosis not present

## 2013-01-11 ENCOUNTER — Encounter (HOSPITAL_COMMUNITY): Payer: Self-pay | Admitting: *Deleted

## 2013-01-11 ENCOUNTER — Emergency Department (HOSPITAL_COMMUNITY)
Admission: EM | Admit: 2013-01-11 | Discharge: 2013-01-11 | Disposition: A | Discharge status: Home / Self Care | Payer: Medicare Other | Attending: Emergency Medicine | Admitting: Emergency Medicine

## 2013-01-11 DIAGNOSIS — K5900 Constipation, unspecified: Secondary | ICD-10-CM | POA: Diagnosis not present

## 2013-01-11 DIAGNOSIS — Z8739 Personal history of other diseases of the musculoskeletal system and connective tissue: Secondary | ICD-10-CM | POA: Diagnosis not present

## 2013-01-11 DIAGNOSIS — F4310 Post-traumatic stress disorder, unspecified: Secondary | ICD-10-CM | POA: Diagnosis not present

## 2013-01-11 DIAGNOSIS — Z8719 Personal history of other diseases of the digestive system: Secondary | ICD-10-CM | POA: Insufficient documentation

## 2013-01-11 DIAGNOSIS — G8929 Other chronic pain: Secondary | ICD-10-CM | POA: Insufficient documentation

## 2013-01-11 DIAGNOSIS — F3289 Other specified depressive episodes: Secondary | ICD-10-CM | POA: Insufficient documentation

## 2013-01-11 DIAGNOSIS — E876 Hypokalemia: Secondary | ICD-10-CM | POA: Diagnosis not present

## 2013-01-11 DIAGNOSIS — Z79899 Other long term (current) drug therapy: Secondary | ICD-10-CM | POA: Diagnosis not present

## 2013-01-11 DIAGNOSIS — M62838 Other muscle spasm: Secondary | ICD-10-CM

## 2013-01-11 DIAGNOSIS — F488 Other specified nonpsychotic mental disorders: Secondary | ICD-10-CM | POA: Insufficient documentation

## 2013-01-11 DIAGNOSIS — R252 Cramp and spasm: Secondary | ICD-10-CM | POA: Diagnosis not present

## 2013-01-11 DIAGNOSIS — F329 Major depressive disorder, single episode, unspecified: Secondary | ICD-10-CM | POA: Insufficient documentation

## 2013-01-11 DIAGNOSIS — Z8742 Personal history of other diseases of the female genital tract: Secondary | ICD-10-CM | POA: Insufficient documentation

## 2013-01-11 DIAGNOSIS — Z8669 Personal history of other diseases of the nervous system and sense organs: Secondary | ICD-10-CM | POA: Insufficient documentation

## 2013-01-11 LAB — CBC WITH DIFFERENTIAL/PLATELET
Basophils Absolute: 0 10*3/uL (ref 0.0–0.1)
Basophils Relative: 1 % (ref 0–1)
Hemoglobin: 11.5 g/dL — ABNORMAL LOW (ref 12.0–15.0)
Lymphocytes Relative: 48 % — ABNORMAL HIGH (ref 12–46)
MCHC: 34.2 g/dL (ref 30.0–36.0)
Monocytes Relative: 6 % (ref 3–12)
Neutro Abs: 2.4 10*3/uL (ref 1.7–7.7)
Neutrophils Relative %: 43 % (ref 43–77)
RBC: 3.81 MIL/uL — ABNORMAL LOW (ref 3.87–5.11)
WBC: 5.5 10*3/uL (ref 4.0–10.5)

## 2013-01-11 LAB — COMPREHENSIVE METABOLIC PANEL
AST: 19 U/L (ref 0–37)
Albumin: 3.7 g/dL (ref 3.5–5.2)
Alkaline Phosphatase: 33 U/L — ABNORMAL LOW (ref 39–117)
BUN: 11 mg/dL (ref 6–23)
CO2: 23 mEq/L (ref 19–32)
Chloride: 102 mEq/L (ref 96–112)
GFR calc non Af Amer: 77 mL/min — ABNORMAL LOW (ref >90)
Potassium: 3.2 mEq/L — ABNORMAL LOW (ref 3.5–5.1)
Total Bilirubin: 0.2 mg/dL — ABNORMAL LOW (ref 0.3–1.2)

## 2013-01-11 LAB — POCT I-STAT, CHEM 8
Chloride: 99 mEq/L (ref 96–112)
HCT: 35 % — ABNORMAL LOW (ref 36.0–46.0)
Hemoglobin: 11.9 g/dL — ABNORMAL LOW (ref 12.0–15.0)
Potassium: 4 mEq/L (ref 3.5–5.1)
Sodium: 130 mEq/L — ABNORMAL LOW (ref 135–145)

## 2013-01-11 MED ORDER — POTASSIUM CHLORIDE CRYS ER 20 MEQ PO TBCR: 20.0000 meq | EXTENDED_RELEASE_TABLET | Freq: Two times a day (BID) | ORAL | Status: DC

## 2013-01-11 MED ORDER — POTASSIUM CHLORIDE CRYS ER 20 MEQ PO TBCR
20.0000 meq | EXTENDED_RELEASE_TABLET | Freq: Once | ORAL | Status: AC
  Administered 2013-01-11: 20 meq via ORAL
  Filled 2013-01-11: qty 1

## 2013-01-11 MED ORDER — CYCLOBENZAPRINE HCL 10 MG PO TABS: 10.0000 mg | ORAL_TABLET | Freq: Two times a day (BID) | ORAL | Status: DC | PRN

## 2013-01-11 NOTE — ED Provider Notes (Signed)
History     CSN: [id number]  Arrival date & time 01/11/13  1826   First MD Initiated Contact with Patient 01/11/13 2028      Chief Complaint  Patient presents with  . Fatigue  . Spasms    HPI 2 to three-day history of general fatigue and muscle cramps and spasms especially lower legs.  Similar symptoms since middle of February.  3 other ER visits for same complaints.  Patient denies persistent nausea vomiting diarrhea.  No history of excessive water intake. Past Medical History  Diagnosis Date  . PTSD (post-traumatic stress disorder)   . Uterine prolapse 2013  . Depression   . Hiatal hernia   . Surgical menopause   . Chronic headaches   . Constipation   . Esophageal reflux     no meds  . Osteopenia     Past Surgical History  Procedure Laterality Date  . Bilateral oophorectomy      bilat  . Rectocele repair    . Cesarean section    . Abdominal hysterectomy      Family History  Problem Relation Age of Onset  . Breast cancer Maternal Grandmother 43  . Breast cancer Paternal Aunt 40  . Celiac disease Paternal Grandmother   . Heart disease Father   . Colon cancer Neg Hx     History  Substance Use Topics  . Smoking status: Never Smoker   . Smokeless tobacco: Never Used  . Alcohol Use: No    OB History   Grav Para Term Preterm Abortions TAB SAB Ect Mult Living   3 3 3       3       Review of Systems All other systems reviewed and are negative Allergies  Review of patient's allergies indicates no known allergies.  Home Medications   Current Outpatient Rx  Name  Route  Sig  Dispense  Refill  . alprazolam (XANAX) 2 MG tablet   Oral   Take 2 mg by mouth every 4 (four) hours as needed for anxiety.         . docusate sodium (COLACE) 100 MG capsule   Oral   Take 200 mg by mouth 2 (two) times daily.          [name] estradiol (ESTRACE) 2 MG tablet   Oral   Take 2 mg by mouth at bedtime.         . Estradiol (VAGIFEM) 10 MCG TABS   Vaginal   Place 1  tablet vaginally 2 (two) times a week. On Mondays and Thursdays         . FLUoxetine (PROZAC) 20 MG tablet   Oral   Take 60 mg by mouth daily.          . Linaclotide (LINZESS) 145 MCG CAPS   Oral   Take 145 mcg by mouth daily.         . minocycline (MINOCIN,DYNACIN) 50 MG capsule   Oral   Take 50 mg by mouth daily. For acne         . naproxen sodium (ANAPROX) 220 MG tablet   Oral   Take 440 mg by mouth daily as needed (for pain).         . QUEtiapine (SEROQUEL) 50 MG tablet   Oral   Take 50 mg by mouth at bedtime.         . risperiDONE (RISPERDAL) 2 MG tablet   Oral   Take 2 mg by mouth 2 (two)  times daily.         [name] topiramate (TOPAMAX) 100 MG tablet   Oral   Take 100 mg by mouth 2 (two) times daily.         . cyclobenzaprine (FLEXERIL) 10 MG tablet   Oral   Take 1 tablet (10 mg total) by mouth 2 (two) times daily as needed for muscle spasms.   20 tablet   0   . potassium chloride SA (K-DUR,KLOR-CON) 20 MEQ tablet   Oral   Take 1 tablet (20 mEq total) by mouth 2 (two) times daily.   60 tablet   0     BP 82/39  Pulse 73  Temp(Src) 98 F (36.7 C) (Oral)  Resp 20  SpO2 99%  LMP 12/14/2003  Physical Exam  Nursing note and vitals reviewed. Constitutional: She is oriented to person, place, and time. She appears well-developed and well-nourished. No distress.  HENT:  Head: Normocephalic and atraumatic.  Eyes: Pupils are equal, round, and reactive to light.  Neck: Normal range of motion.  Cardiovascular: Normal rate and intact distal pulses.   Pulmonary/Chest: No respiratory distress. She has no wheezes. She has no rales.  Abdominal: Normal appearance. She exhibits no distension.  Musculoskeletal: Normal range of motion. She exhibits no edema and no tenderness.  Neurological: She is alert and oriented to person, place, and time. No cranial nerve deficit.  Skin: Skin is warm and dry. No rash noted.  Psychiatric: She has a normal mood and  affect. Her behavior is normal.    ED Course  Procedures (including critical care time)  Labs Reviewed  CBC WITH DIFFERENTIAL - Abnormal; Notable for the following:    RBC 3.81 (*)    Hemoglobin 11.5 (*)    HCT 33.6 (*)    Lymphocytes Relative 48 (*)    All other components within normal limits  COMPREHENSIVE METABOLIC PANEL - Abnormal; Notable for the following:    Potassium 3.2 (*)    Alkaline Phosphatase 33 (*)    Total Bilirubin 0.2 (*)    GFR calc non Af Amer 77 (*)    GFR calc Af Amer 90 (*)    All other components within normal limits  POCT I-STAT, CHEM 8 - Abnormal; Notable for the following:    Sodium 130 (*)    Hemoglobin 11.9 (*)    HCT 35.0 (*)    All other components within normal limits  TSH   No results found.   1. Hypokalemia   2. Muscle spasm   3. Psychogenic general fatigue       MDM  Initial sodium on i-STAT was slow but repeat i-STAT showed normal sodium and low potassium.  The low potassium better explains her symptoms of general fatigue and muscle cramps.  Plan at this time is to give by mouth potassium and prescription for potassium and instructions on high potassium containing foods.  Followup with her local doctor strongly encouraged.        Nelia Shi, MD 01/11/13 2229

## 2013-01-11 NOTE — ED Notes (Signed)
Patient presents with c/o intermittent bilateral leg cramps x several weeks. Took a flexeril today around 1 pm with some relief in sx. Patient able to ambulate without any problems. No hx DVT. No recent travel. No SOB.

## 2013-01-11 NOTE — ED Notes (Signed)
Reports generalized fatigue x 3 days and having muscle spasms, especially in her lower legs. Ambulatory at triage.

## 2013-01-12 LAB — TSH: TSH: 2.737 u[IU]/mL (ref 0.350–4.500)

## 2013-01-21 DIAGNOSIS — F4310 Post-traumatic stress disorder, unspecified: Secondary | ICD-10-CM | POA: Diagnosis not present

## 2013-01-21 DIAGNOSIS — F331 Major depressive disorder, recurrent, moderate: Secondary | ICD-10-CM | POA: Diagnosis not present

## 2013-01-21 DIAGNOSIS — F509 Eating disorder, unspecified: Secondary | ICD-10-CM | POA: Diagnosis not present

## 2013-01-21 DIAGNOSIS — F411 Generalized anxiety disorder: Secondary | ICD-10-CM | POA: Diagnosis not present

## 2013-01-22 DIAGNOSIS — R079 Chest pain, unspecified: Secondary | ICD-10-CM | POA: Diagnosis not present

## 2013-01-22 DIAGNOSIS — F341 Dysthymic disorder: Secondary | ICD-10-CM | POA: Diagnosis not present

## 2013-01-22 DIAGNOSIS — R5381 Other malaise: Secondary | ICD-10-CM | POA: Diagnosis not present

## 2013-01-22 DIAGNOSIS — R0602 Shortness of breath: Secondary | ICD-10-CM | POA: Diagnosis not present

## 2013-01-22 DIAGNOSIS — E876 Hypokalemia: Secondary | ICD-10-CM | POA: Diagnosis not present

## 2013-01-22 DIAGNOSIS — F4310 Post-traumatic stress disorder, unspecified: Secondary | ICD-10-CM | POA: Diagnosis not present

## 2013-01-23 DIAGNOSIS — F411 Generalized anxiety disorder: Secondary | ICD-10-CM | POA: Diagnosis not present

## 2013-01-24 DIAGNOSIS — F411 Generalized anxiety disorder: Secondary | ICD-10-CM | POA: Diagnosis not present

## 2013-01-25 DIAGNOSIS — F411 Generalized anxiety disorder: Secondary | ICD-10-CM | POA: Diagnosis not present

## 2013-01-28 ENCOUNTER — Emergency Department (HOSPITAL_COMMUNITY)
Admission: EM | Admit: 2013-01-28 | Discharge: 2013-01-28 | Disposition: A | Discharge status: Home / Self Care | Payer: Medicare Other | Attending: Emergency Medicine | Admitting: Emergency Medicine

## 2013-01-28 ENCOUNTER — Encounter (HOSPITAL_COMMUNITY): Payer: Self-pay | Admitting: *Deleted

## 2013-01-28 DIAGNOSIS — F329 Major depressive disorder, single episode, unspecified: Secondary | ICD-10-CM | POA: Diagnosis not present

## 2013-01-28 DIAGNOSIS — R51 Headache: Secondary | ICD-10-CM | POA: Diagnosis not present

## 2013-01-28 DIAGNOSIS — Z8719 Personal history of other diseases of the digestive system: Secondary | ICD-10-CM | POA: Insufficient documentation

## 2013-01-28 DIAGNOSIS — Z8739 Personal history of other diseases of the musculoskeletal system and connective tissue: Secondary | ICD-10-CM | POA: Diagnosis not present

## 2013-01-28 DIAGNOSIS — Z8742 Personal history of other diseases of the female genital tract: Secondary | ICD-10-CM | POA: Insufficient documentation

## 2013-01-28 DIAGNOSIS — F3289 Other specified depressive episodes: Secondary | ICD-10-CM | POA: Insufficient documentation

## 2013-01-28 DIAGNOSIS — R5381 Other malaise: Secondary | ICD-10-CM | POA: Diagnosis not present

## 2013-01-28 DIAGNOSIS — R5383 Other fatigue: Secondary | ICD-10-CM | POA: Insufficient documentation

## 2013-01-28 DIAGNOSIS — Z8659 Personal history of other mental and behavioral disorders: Secondary | ICD-10-CM | POA: Diagnosis not present

## 2013-01-28 DIAGNOSIS — F411 Generalized anxiety disorder: Secondary | ICD-10-CM | POA: Diagnosis not present

## 2013-01-28 DIAGNOSIS — G8929 Other chronic pain: Secondary | ICD-10-CM | POA: Insufficient documentation

## 2013-01-28 DIAGNOSIS — F419 Anxiety disorder, unspecified: Secondary | ICD-10-CM

## 2013-01-28 DIAGNOSIS — Z79899 Other long term (current) drug therapy: Secondary | ICD-10-CM | POA: Insufficient documentation

## 2013-01-28 LAB — CBC WITH DIFFERENTIAL/PLATELET
Basophils Absolute: 0.1 10*3/uL (ref 0.0–0.1)
Basophils Relative: 1 % (ref 0–1)
Eosinophils Absolute: 0 10*3/uL (ref 0.0–0.7)
Eosinophils Relative: 1 % (ref 0–5)
MCH: 30.3 pg (ref 26.0–34.0)
MCV: 86.7 fL (ref 78.0–100.0)
Platelets: 238 10*3/uL (ref 150–400)
RDW: 12.3 % (ref 11.5–15.5)

## 2013-01-28 LAB — COMPREHENSIVE METABOLIC PANEL
ALT: 15 U/L (ref 0–35)
AST: 19 U/L (ref 0–37)
Albumin: 4.2 g/dL (ref 3.5–5.2)
Calcium: 9.1 mg/dL (ref 8.4–10.5)
GFR calc Af Amer: >90 mL/min (ref >90)
Sodium: 137 mEq/L (ref 135–145)
Total Protein: 7.3 g/dL (ref 6.0–8.3)

## 2013-01-28 LAB — URINALYSIS, ROUTINE W REFLEX MICROSCOPIC
Bilirubin Urine: NEGATIVE
Hgb urine dipstick: NEGATIVE
Nitrite: NEGATIVE
Specific Gravity, Urine: 1.006 (ref 1.005–1.030)
pH: 6 (ref 5.0–8.0)

## 2013-01-28 MED ORDER — IBUPROFEN 400 MG PO TABS: 400.0000 mg | ORAL_TABLET | Freq: Once | ORAL | Status: DC
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
## 2013-01-28 NOTE — ED Notes (Signed)
Pt to ED c/o dizziness and weakness since this am.  She usually gets these symptoms before she gets her chronic headaches.  Pt took a phenergan for nausea (she thought she might be getting a panic attack) and 2 0.5 mg klonopins (1 at 0530 and 1 at 0700).  PT states she is becoming increasingly irritable.  Pt experiencing frontal headache 4/10 pain.
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
## 2013-01-29 DIAGNOSIS — F339 Major depressive disorder, recurrent, unspecified: Secondary | ICD-10-CM | POA: Diagnosis not present

## 2013-01-29 NOTE — ED Provider Notes (Signed)
Medical screening examination/treatment/procedure(s) were conducted as a shared visit with non-physician practitioner(s) and myself.  I personally evaluated the patient during the encounter.  Patient seen for evaluation of headache. Patient has a history of chronic headaches. She is here with her typical headache as well as increased anxiety. Agree with resident's treatment plan.  Gilda Crease, MD 01/29/13 2240

## 2013-02-02 ENCOUNTER — Emergency Department (HOSPITAL_COMMUNITY)
Admission: EM | Admit: 2013-02-02 | Discharge: 2013-02-02 | Discharge status: Against Medical Advice | Payer: Medicare Other | Attending: Emergency Medicine | Admitting: Emergency Medicine

## 2013-02-02 ENCOUNTER — Encounter (HOSPITAL_COMMUNITY): Payer: Self-pay | Admitting: Emergency Medicine

## 2013-02-02 DIAGNOSIS — R51 Headache: Secondary | ICD-10-CM | POA: Insufficient documentation

## 2013-02-02 DIAGNOSIS — F509 Eating disorder, unspecified: Secondary | ICD-10-CM | POA: Insufficient documentation

## 2013-02-02 DIAGNOSIS — R5381 Other malaise: Secondary | ICD-10-CM | POA: Diagnosis not present

## 2013-02-02 HISTORY — DX: Eating disorder, unspecified: F50.9

## 2013-02-02 LAB — COMPREHENSIVE METABOLIC PANEL
AST: 18 U/L (ref 0–37)
Albumin: 3.8 g/dL (ref 3.5–5.2)
Alkaline Phosphatase: 39 U/L (ref 39–117)
BUN: 9 mg/dL (ref 6–23)
Chloride: 102 mEq/L (ref 96–112)
Potassium: 3.7 mEq/L (ref 3.5–5.1)
Total Bilirubin: 0.2 mg/dL — ABNORMAL LOW (ref 0.3–1.2)

## 2013-02-02 LAB — CBC WITH DIFFERENTIAL/PLATELET
Basophils Absolute: 0 10*3/uL (ref 0.0–0.1)
Basophils Relative: 1 % (ref 0–1)
Eosinophils Absolute: 0 10*3/uL (ref 0.0–0.7)
Eosinophils Relative: 1 % (ref 0–5)
MCH: 29.7 pg (ref 26.0–34.0)
MCHC: 34.4 g/dL (ref 30.0–36.0)
MCV: 86.3 fL (ref 78.0–100.0)
Platelets: 211 10*3/uL (ref 150–400)
RDW: 12.5 % (ref 11.5–15.5)
WBC: 3.5 10*3/uL — ABNORMAL LOW (ref 4.0–10.5)

## 2013-02-02 LAB — URINALYSIS, MICROSCOPIC ONLY
Ketones, ur: NEGATIVE mg/dL
Leukocytes, UA: NEGATIVE
Nitrite: NEGATIVE
Protein, ur: NEGATIVE mg/dL

## 2013-02-02 NOTE — ED Notes (Signed)
Pt sts dizziness, HA and generalized weakness x 2 days; pt sts hx of eating disorder

## 2013-02-03 DIAGNOSIS — F411 Generalized anxiety disorder: Secondary | ICD-10-CM | POA: Diagnosis not present

## 2013-02-03 DIAGNOSIS — Z79899 Other long term (current) drug therapy: Secondary | ICD-10-CM | POA: Diagnosis not present

## 2013-02-03 DIAGNOSIS — F331 Major depressive disorder, recurrent, moderate: Secondary | ICD-10-CM | POA: Diagnosis not present

## 2013-02-05 DIAGNOSIS — R52 Pain, unspecified: Secondary | ICD-10-CM | POA: Diagnosis not present

## 2013-02-05 DIAGNOSIS — F410 Panic disorder [episodic paroxysmal anxiety] without agoraphobia: Secondary | ICD-10-CM | POA: Diagnosis not present

## 2013-02-05 DIAGNOSIS — F411 Generalized anxiety disorder: Secondary | ICD-10-CM | POA: Diagnosis not present

## 2013-02-05 DIAGNOSIS — R079 Chest pain, unspecified: Secondary | ICD-10-CM | POA: Diagnosis not present

## 2013-02-05 DIAGNOSIS — Z79899 Other long term (current) drug therapy: Secondary | ICD-10-CM | POA: Diagnosis not present

## 2013-02-05 DIAGNOSIS — F4310 Post-traumatic stress disorder, unspecified: Secondary | ICD-10-CM | POA: Diagnosis not present

## 2013-02-10 DIAGNOSIS — F509 Eating disorder, unspecified: Secondary | ICD-10-CM | POA: Diagnosis not present

## 2013-02-10 DIAGNOSIS — F331 Major depressive disorder, recurrent, moderate: Secondary | ICD-10-CM | POA: Diagnosis not present

## 2013-02-10 DIAGNOSIS — F411 Generalized anxiety disorder: Secondary | ICD-10-CM | POA: Diagnosis not present

## 2013-02-10 DIAGNOSIS — F4310 Post-traumatic stress disorder, unspecified: Secondary | ICD-10-CM | POA: Diagnosis not present

## 2013-02-12 ENCOUNTER — Encounter: Payer: Self-pay | Admitting: Family Medicine

## 2013-02-12 DIAGNOSIS — J309 Allergic rhinitis, unspecified: Secondary | ICD-10-CM

## 2013-02-12 DIAGNOSIS — F4310 Post-traumatic stress disorder, unspecified: Secondary | ICD-10-CM | POA: Insufficient documentation

## 2013-02-13 ENCOUNTER — Encounter: Payer: Self-pay | Admitting: Physician Assistant

## 2013-02-13 ENCOUNTER — Ambulatory Visit (INDEPENDENT_AMBULATORY_CARE_PROVIDER_SITE_OTHER): Payer: Medicare Other | Admitting: Gastroenterology

## 2013-02-13 ENCOUNTER — Telehealth: Payer: Self-pay | Admitting: *Deleted

## 2013-02-13 ENCOUNTER — Ambulatory Visit (INDEPENDENT_AMBULATORY_CARE_PROVIDER_SITE_OTHER): Payer: Medicare Other | Admitting: Physician Assistant

## 2013-02-13 ENCOUNTER — Encounter: Payer: Self-pay | Admitting: Gastroenterology

## 2013-02-13 VITALS — BP 112/74 | HR 76 | Temp 97.7°F | Resp 18 | Ht 60.5 in | Wt 112.0 lb

## 2013-02-13 VITALS — BP 100/70 | HR 70 | Ht 62.0 in | Wt 112.1 lb

## 2013-02-13 DIAGNOSIS — K5901 Slow transit constipation: Secondary | ICD-10-CM

## 2013-02-13 DIAGNOSIS — K599 Functional intestinal disorder, unspecified: Secondary | ICD-10-CM

## 2013-02-13 DIAGNOSIS — F341 Dysthymic disorder: Secondary | ICD-10-CM

## 2013-02-13 DIAGNOSIS — F509 Eating disorder, unspecified: Secondary | ICD-10-CM | POA: Diagnosis not present

## 2013-02-13 MED ORDER — LINACLOTIDE 145 MCG PO CAPS: 145.0000 ug | ORAL_CAPSULE | Freq: Every day | ORAL | Status: DC

## 2013-02-13 NOTE — Telephone Encounter (Signed)
I SPOKE WITH Regina Steele FOR PRIOR AUTHORIZATION FOR LINZESS.  LINZESS WAS APPROVED FOR ONE YEAR STARTING TODAY.  CASE NUMBER FOR APPROVAL: [id number]  PATIENT NOTIFIED AND PRESCRIPTION SENT TO REQUESTED PHARMACY

## 2013-02-13 NOTE — Progress Notes (Signed)
Patient ID: Regina Steele MRN: [id number], DOB: December 20, 1980, 32 y.o. Date of Encounter: 02/13/2013, 5:04 PM    Chief Complaint:  Chief Complaint  Patient presents with  . anxiety attacks and chest pain     HPI: 32 y.o. year old female says she has had to go to ER twice recently -both times diagnosed as Anxiety. One visit was 2-3 weeks ago-she felt weak and woozy-dx as anxiety.  The other visit was 1.5 weeks ago-felt cheast pain and anxiety. Diagnosed as anxiety. She has a psychiatrist-is on multiple psych meds. Says she tells psych that she has chest pain and anxiety and he just says it will be better next week. She sees him on a weekly basis. Every time she reports c.p. And anxiety and every time he says it will be better next week. LOV there was 3 days ago.   Home Meds: Current Outpatient Prescriptions on File Prior to Visit  Medication Sig Dispense Refill  . carbamazepine (TEGRETOL) 200 MG tablet Take 200 mg by mouth 3 (three) times daily.      . cetirizine (ZYRTEC) 10 MG tablet Take 10 mg by mouth daily as needed for allergies.      . clonazePAM (KLONOPIN) 0.5 MG tablet Take 0.5 mg by mouth daily.      . cyclobenzaprine (FLEXERIL) 10 MG tablet Take 1 tablet (10 mg total) by mouth 2 (two) times daily as needed for muscle spasms.  20 tablet  0  . docusate sodium (COLACE) 100 MG capsule Take 200 mg by mouth 2 (two) times daily.       [name] estradiol (ESTRACE) 2 MG tablet Take 2 mg by mouth at bedtime.      . Estradiol (VAGIFEM) 10 MCG TABS Place 1 tablet vaginally 2 (two) times a week. On Mondays and Thursdays      . FLUoxetine (PROZAC) 20 MG tablet Take 60 mg by mouth daily.       . fluticasone (FLONASE) 50 MCG/ACT nasal spray Place 2 sprays into the nose daily.      . hydrOXYzine (ATARAX/VISTARIL) 50 MG tablet Take 50 mg by mouth every 4 (four) hours as needed for itching.      . Linaclotide (LINZESS) 145 MCG CAPS Take 1 capsule (145 mcg total) by mouth daily.  28 capsule  0  .  minocycline (MINOCIN,DYNACIN) 50 MG capsule Take 50 mg by mouth daily. For acne      . naproxen sodium (ANAPROX) 220 MG tablet Take 440 mg by mouth daily as needed (for pain).      . potassium chloride SA (K-DUR,KLOR-CON) 20 MEQ tablet Take 1 tablet (20 mEq total) by mouth 2 (two) times daily.  60 tablet  0  . QUEtiapine (SEROQUEL) 50 MG tablet Take 50-100 mg by mouth at bedtime.       . risperiDONE (RISPERDAL) 2 MG tablet Take 2 mg by mouth 2 (two) times daily.      [name] topiramate (TOPAMAX) 100 MG tablet Take 100 mg by mouth 2 (two) times daily.       No current facility-administered medications on file prior to visit.    Allergies: No Known Allergies    Review of Systems: Constitutional: negative for chills, fever, night sweats, weight changes, or fatigue  HEENT: negative for vision changes, hearing loss, congestion, rhinorrhea, ST, epistaxis, or sinus pressure Cardiovascular: negative for chest pain or palpitations Respiratory: negative for hemoptysis, wheezing, shortness of breath, or cough Abdominal: negative for abdominal pain, nausea, vomiting,  diarrhea, or constipation Dermatological: negative for rash Neurologic: negative for headache, dizziness, or syncope    Physical Exam: Blood pressure 112/74, pulse 76, temperature 97.7 F (36.5 C), temperature source Oral, resp. rate 18, height 5' 0.5" (1.537 m), weight 112 lb (50.803 kg), last menstrual period 12/14/2003., Body mass index is 21.51 kg/(m^2). General: Well developed, well nourished, in no acute distress. Neck: Supple. No thyromegaly. Full ROM. No lymphadenopathy. Lungs: Clear bilaterally to auscultation without wheezes, rales, or rhonchi. Breathing is unlabored. Heart: RRR with S1 S2. No murmurs, rubs, or gallops appreciated. Msk:  Strength and tone normal for age. Extremities/Skin: Warm and dry. No clubbing or cyanosis. No edema. No rashes or suspicious lesions. Neuro: Alert and oriented X 3. Moves all extremities  spontaneously. Gait is normal. CNII-XII grossly in tact. Psych:  Responds to questions appropriately with a normal affect.Her mood is currently stable and appropriate.    ASSESSMENT AND PLAN:  32 y.o. year old female with  1. ANXIETY DEPRESSION She is already on multiple psych meds. I explained that I do not feel comfortable adjusting these meds. She understands and agrees. Says she just wants to see a different psychitrist and had to see [place] in order to do so-has mcr/mcd. I provided contact phone number for her to call [facility]. They will provide her with info she needs.     Murray Hodgkins [place], [place], [facility] 02/13/2013 5:04 PM

## 2013-02-13 NOTE — Patient Instructions (Addendum)
  Samples of Linzess was given today, please continue taking as you have been.  We will call your insurance company for prior authorization and call you back. _______________________________________________________________________________________________________________________________________________________                                               We are excited to introduce MyChart, a new best-in-class service that provides you online access to important information in your electronic medical record. We want to make it easier for you to view your health information - all in one secure location - when and where you need it. We expect MyChart will enhance the quality of care and service we provide.  When you register for MyChart, you can:    View your test results.    Request appointments and receive appointment reminders via email.    Request medication renewals.    View your medical history, allergies, medications and immunizations.    Communicate with your physician's office through a password-protected site.    Conveniently print information such as your medication lists.  To find out if MyChart is right for you, please talk to a member of our clinical staff today. We will gladly answer your questions about this free health and wellness tool.  If you are age 33 or older and want a member of your family to have access to your record, you must provide written consent by completing a proxy form available at our office. Please speak to our clinical staff about guidelines regarding accounts for patients younger than age 71.  As you activate your MyChart account and need any technical assistance, please call the MyChart technical support line at [phone]) or email your question to [email or web address]. If you email your question(s), please include your name, a return phone number and the best time to reach you.  If you have non-urgent health-related  questions, you can send a message to our office through MyChart at [email or web address]. If you have a medical emergency, call 911.  Thank you for using MyChart as your new health and wellness resource!   MyChart licensed from [employer],  [id number]. Patents Pending.

## 2013-02-13 NOTE — Progress Notes (Addendum)
This is a 32 year old [name] mother 3 who has had chronic functional constipation all her life with previous motility studies at [facility] in [place] which have shown slow transit problems.  She has fortunately responded to Linzess 45 mcg a day and allegedly is having daily bowel movements.  She denies abdominal pain, nausea vomiting, anorexia or weight loss.  She does have a past history of a chronic healing disorder, and is on multiple psychotropic medications.  She denies a specific food intolerances, and previous evaluation for celiac disease was negative.  Current Medications, Allergies, Past Medical History, Past Surgical History, Family History and Social History were reviewed in [employer] record.  ROS: All systems were reviewed and are negative unless otherwise stated in the HPI.          Physical Exam: Blood pressure 100/70, pulse 70 and regular, and weight under and 12 pounds the BMI of 20.5.  I cannot appreciate stigmata of chronic liver disease.  Her abdomen shows mild distention but no definite organomegaly, masses, tenderness.  Bowel sounds are present and are not hyperactive or obstructive in nature.  Mental status is normal with a flat affect.    Assessment and Plan: Chronic slow transit constipation without evidence of motility disturbance of other parts of her gut.  This  patient has a chronic psychiatric condition  And H/O associated eating disorder.  She seemed to be doing extremely well her current medications and also on Linzess 145 mcg a day which she needs to maintain bowel normalicy.  I see no need for repeat GI evaluation this time since the patient is doing well clinically.  Lab review from our shows normal CBC, metabolic and liver profiles.  She's given samples of Linzess 145 mcg tablets pending hopefully successful communication with her insurance company.  I would not think that they want this complicated and  fragile patient to again become sick with multiple bowel problems requiring repeat GI evaluations.  We will send this letter to them for review.

## 2013-02-14 DIAGNOSIS — Z79899 Other long term (current) drug therapy: Secondary | ICD-10-CM | POA: Diagnosis not present

## 2013-02-14 DIAGNOSIS — R0789 Other chest pain: Secondary | ICD-10-CM | POA: Diagnosis not present

## 2013-02-14 DIAGNOSIS — R079 Chest pain, unspecified: Secondary | ICD-10-CM | POA: Diagnosis not present

## 2013-02-14 DIAGNOSIS — F4310 Post-traumatic stress disorder, unspecified: Secondary | ICD-10-CM | POA: Diagnosis not present

## 2013-02-14 DIAGNOSIS — F411 Generalized anxiety disorder: Secondary | ICD-10-CM | POA: Diagnosis not present

## 2013-02-16 ENCOUNTER — Ambulatory Visit: Payer: Self-pay | Admitting: Physician Assistant

## 2013-02-17 DIAGNOSIS — F331 Major depressive disorder, recurrent, moderate: Secondary | ICD-10-CM | POA: Diagnosis not present

## 2013-02-17 DIAGNOSIS — F411 Generalized anxiety disorder: Secondary | ICD-10-CM | POA: Diagnosis not present

## 2013-02-17 DIAGNOSIS — F4310 Post-traumatic stress disorder, unspecified: Secondary | ICD-10-CM | POA: Diagnosis not present

## 2013-02-17 DIAGNOSIS — F509 Eating disorder, unspecified: Secondary | ICD-10-CM | POA: Diagnosis not present

## 2013-02-23 ENCOUNTER — Encounter (HOSPITAL_COMMUNITY): Payer: Self-pay

## 2013-02-23 ENCOUNTER — Emergency Department (HOSPITAL_COMMUNITY)
Admission: EM | Admit: 2013-02-23 | Discharge: 2013-02-23 | Disposition: A | Discharge status: Home / Self Care | Payer: Medicare Other | Attending: Emergency Medicine | Admitting: Emergency Medicine

## 2013-02-23 ENCOUNTER — Other Ambulatory Visit: Payer: Self-pay

## 2013-02-23 DIAGNOSIS — Z79899 Other long term (current) drug therapy: Secondary | ICD-10-CM | POA: Diagnosis not present

## 2013-02-23 DIAGNOSIS — F341 Dysthymic disorder: Secondary | ICD-10-CM | POA: Diagnosis not present

## 2013-02-23 DIAGNOSIS — F419 Anxiety disorder, unspecified: Secondary | ICD-10-CM

## 2013-02-23 DIAGNOSIS — IMO0002 Reserved for concepts with insufficient information to code with codable children: Secondary | ICD-10-CM | POA: Diagnosis not present

## 2013-02-23 DIAGNOSIS — G8929 Other chronic pain: Secondary | ICD-10-CM | POA: Insufficient documentation

## 2013-02-23 DIAGNOSIS — M899 Disorder of bone, unspecified: Secondary | ICD-10-CM | POA: Diagnosis not present

## 2013-02-23 DIAGNOSIS — Z8669 Personal history of other diseases of the nervous system and sense organs: Secondary | ICD-10-CM | POA: Diagnosis not present

## 2013-02-23 DIAGNOSIS — F411 Generalized anxiety disorder: Secondary | ICD-10-CM | POA: Diagnosis not present

## 2013-02-23 DIAGNOSIS — F4310 Post-traumatic stress disorder, unspecified: Secondary | ICD-10-CM | POA: Insufficient documentation

## 2013-02-23 DIAGNOSIS — Z8742 Personal history of other diseases of the female genital tract: Secondary | ICD-10-CM | POA: Insufficient documentation

## 2013-02-23 DIAGNOSIS — F329 Major depressive disorder, single episode, unspecified: Secondary | ICD-10-CM | POA: Insufficient documentation

## 2013-02-23 DIAGNOSIS — F3289 Other specified depressive episodes: Secondary | ICD-10-CM | POA: Insufficient documentation

## 2013-02-23 DIAGNOSIS — R450 Nervousness: Secondary | ICD-10-CM | POA: Insufficient documentation

## 2013-02-23 DIAGNOSIS — R51 Headache: Secondary | ICD-10-CM | POA: Insufficient documentation

## 2013-02-23 DIAGNOSIS — Z8719 Personal history of other diseases of the digestive system: Secondary | ICD-10-CM | POA: Insufficient documentation

## 2013-02-23 DIAGNOSIS — M949 Disorder of cartilage, unspecified: Secondary | ICD-10-CM | POA: Insufficient documentation

## 2013-02-23 MED ORDER — LORAZEPAM 1 MG PO TABS
1.0000 mg | ORAL_TABLET | Freq: Once | ORAL | Status: AC
  Administered 2013-02-23: 1 mg via ORAL
  Filled 2013-02-23: qty 1

## 2013-02-23 NOTE — ED Notes (Signed)
Pt. Has a hx of anxiety and was taking Xanax and Dr. Christ Kick , took her off of Xanax and started her on Klonipin, 1 month ago and stopped it on Saturday.  Anxiety began on Saturday evening, Sunday and then all today, Pt. Describes her panic attacks of having chest pain unable to function, cannot do chores. Pt.  Feels a little calmer ,  Her chest pain comes and goes.  Present having chest pain 6/10/  Denies any sob , n/v, Skin is w/d/p

## 2013-02-23 NOTE — ED Provider Notes (Signed)
History     CSN: [id number]  Arrival date & time 02/23/13  1806   First MD Initiated Contact with Patient 02/23/13 2140      Chief Complaint  Patient presents with  . Anxiety    (Consider location/radiation/quality/duration/timing/severity/associated sxs/prior treatment) Patient is a 32 y.o. female presenting with anxiety. The history is provided by the patient.  Anxiety This is a chronic problem. The current episode started in the past 7 days. The problem occurs constantly. The problem has been gradually worsening. Pertinent negatives include no abdominal pain, chest pain, chills, coughing, fever, neck pain, rash or vomiting. Exacerbated by: since running out of Ativan. She has tried nothing for the symptoms.    Past Medical History  Diagnosis Date  . PTSD (post-traumatic stress disorder)   . Uterine prolapse 2013  . Depression   . Hiatal hernia   . Surgical menopause   . Chronic headaches   . Constipation   . Esophageal reflux     no meds  . Osteopenia   . Eating disorder   . Anxiety     Past Surgical History  Procedure Laterality Date  . Bilateral oophorectomy      bilat  . Rectocele repair    . Cesarean section    . Abdominal hysterectomy      Family History  Problem Relation Age of Onset  . Breast cancer Maternal Grandmother 51  . Breast cancer Paternal Aunt 40  . Celiac disease Paternal Grandmother   . Heart disease Father   . Colon cancer Neg Hx     History  Substance Use Topics  . Smoking status: Never Smoker   . Smokeless tobacco: Never Used  . Alcohol Use: No    OB History   Grav Para Term Preterm Abortions TAB SAB Ect Mult Living   3 3 3       3       Review of Systems  Constitutional: Negative for fever, chills, activity change and appetite change.  HENT: Negative for neck pain and neck stiffness.   Respiratory: Negative for cough, chest tightness, shortness of breath and wheezing.   Cardiovascular: Negative for chest pain and  palpitations.  Gastrointestinal: Negative for vomiting, abdominal pain, diarrhea and constipation.  Genitourinary: Negative for dysuria, decreased urine volume and difficulty urinating.  Skin: Negative for rash and wound.  Neurological: Negative for seizures, syncope, facial asymmetry and light-headedness.  Psychiatric/Behavioral: Positive for decreased concentration. Negative for behavioral problems, confusion, self-injury, dysphoric mood and agitation. The patient is nervous/anxious.   All other systems reviewed and are negative.    Allergies  Review of patient's allergies indicates no known allergies.  Home Medications   Current Outpatient Rx  Name  Route  Sig  Dispense  Refill  . carbamazepine (TEGRETOL) 200 MG tablet   Oral   Take 200 mg by mouth 3 (three) times daily.         . cetirizine (ZYRTEC) 10 MG tablet   Oral   Take 10 mg by mouth daily.          . cyclobenzaprine (FLEXERIL) 10 MG tablet   Oral   Take 1 tablet (10 mg total) by mouth 2 (two) times daily as needed for muscle spasms.   20 tablet   0   . docusate sodium (COLACE) 100 MG capsule   Oral   Take 200 mg by mouth 2 (two) times daily.          [name] estradiol (ESTRACE) 2 MG tablet  Oral   Take 2 mg by mouth at bedtime.         . Estradiol (VAGIFEM) 10 MCG TABS   Vaginal   Place 1 tablet vaginally 2 (two) times a week. On Mondays and Thursdays         . FLUoxetine (PROZAC) 20 MG tablet   Oral   Take 60 mg by mouth every morning.          . fluticasone (FLONASE) 50 MCG/ACT nasal spray   Nasal   Place 2 sprays into the nose daily.         . hydrOXYzine (ATARAX/VISTARIL) 50 MG tablet   Oral   Take 50 mg by mouth every 4 (four) hours as needed for itching.         . Linaclotide (LINZESS) 145 MCG CAPS   Oral   Take 1 capsule (145 mcg total) by mouth daily.   30 capsule   11   . minocycline (MINOCIN,DYNACIN) 50 MG capsule   Oral   Take 50 mg by mouth daily. For acne         .  naproxen sodium (ANAPROX) 220 MG tablet   Oral   Take 440 mg by mouth daily as needed (for pain).         . potassium chloride SA (K-DUR,KLOR-CON) 20 MEQ tablet   Oral   Take 1 tablet (20 mEq total) by mouth 2 (two) times daily.   60 tablet   0   . QUEtiapine (SEROQUEL) 50 MG tablet   Oral   Take 100 mg by mouth at bedtime.          . risperiDONE (RISPERDAL) 2 MG tablet   Oral   Take 2 mg by mouth 2 (two) times daily.         [name] topiramate (TOPAMAX) 100 MG tablet   Oral   Take 100 mg by mouth 2 (two) times daily.           BP 117/76  Pulse 78  Temp(Src) 98.1 F (36.7 C) (Oral)  Resp 18  SpO2 98%  LMP 12/14/2003  Physical Exam  Nursing note and vitals reviewed. Constitutional: She is oriented to person, place, and time. She appears well-developed and well-nourished.  HENT:  Head: Normocephalic and atraumatic.  Right Ear: External ear normal.  Left Ear: External ear normal.  Nose: Nose normal.  Mouth/Throat: Oropharynx is clear and moist. No oropharyngeal exudate.  Eyes: Conjunctivae are normal. Pupils are equal, round, and reactive to light.  Neck: Normal range of motion. Neck supple.  Cardiovascular: Normal rate, regular rhythm, normal heart sounds and intact distal pulses.  Exam reveals no gallop and no friction rub.   No murmur heard. Pulmonary/Chest: Effort normal and breath sounds normal. No respiratory distress. She has no wheezes. She has no rales. She exhibits no tenderness.  Abdominal: Soft. Bowel sounds are normal. She exhibits no distension and no mass. There is no tenderness. There is no rebound and no guarding.  Musculoskeletal: Normal range of motion. She exhibits no edema and no tenderness.  Neurological: She is alert and oriented to person, place, and time. She displays normal reflexes. No cranial nerve deficit. She exhibits normal muscle tone. Coordination normal.  Skin: Skin is warm and dry.  Psychiatric: Her behavior is normal. Judgment and  thought content normal.  Anxious mood/affect    ED Course  Procedures (including critical care time)  Labs Reviewed - No data to display No results found.  1. Anxiety   2. Depression       MDM  32 yo F w/hx of anxiety/depression presents for worsened anxiety since running out of her Klonopin 3 days ago. Has psychiatrist (Dr. Sandria Manly) in [facility]. Denies suicidal/homicidal ideation. AFVSS. No medical complaints. Hx of hysterectomy. Dose of PO ativan administered with improvement of anxiety. Pt instructed to f/u with psychiatrist regarding need for medication refills. Patient given return precautions, including worsening of signs or symptoms. Patient instructed to follow-up with primary care physician.          Clemetine Marker, MD 02/23/13 2256

## 2013-02-23 NOTE — ED Notes (Signed)
Pt presents with anxiety that started around 0800 this morning- pt has hx of anxiety in the past.  States her psychiatrist took her off Klonopin on Friday and it has been hard for her.  States she began to calm down in the ED waiting room.  Denies any complaints at this time.  Will continue to monitor pt.

## 2013-02-24 NOTE — ED Provider Notes (Signed)
I saw  the patient, reviewed the resident's note and I agree with the findings and plan.   .Face to face Exam:  General:  Awake HEENT:  Atraumatic Resp:  Normal effort Abd:  Nondistended Neuro:No focal weakness   Nelia Shi, MD 02/24/13 1241

## 2013-02-25 DIAGNOSIS — F410 Panic disorder [episodic paroxysmal anxiety] without agoraphobia: Secondary | ICD-10-CM | POA: Diagnosis not present

## 2013-02-25 DIAGNOSIS — F4310 Post-traumatic stress disorder, unspecified: Secondary | ICD-10-CM | POA: Diagnosis not present

## 2013-03-02 ENCOUNTER — Encounter: Payer: Self-pay | Admitting: *Deleted

## 2013-03-03 ENCOUNTER — Ambulatory Visit: Payer: Medicare Other | Admitting: Gastroenterology

## 2013-03-12 ENCOUNTER — Ambulatory Visit (INDEPENDENT_AMBULATORY_CARE_PROVIDER_SITE_OTHER): Payer: Medicare Other | Admitting: Physician Assistant

## 2013-03-12 ENCOUNTER — Ambulatory Visit (INDEPENDENT_AMBULATORY_CARE_PROVIDER_SITE_OTHER): Payer: Medicare Other | Admitting: Gastroenterology

## 2013-03-12 ENCOUNTER — Encounter: Payer: Self-pay | Admitting: Physician Assistant

## 2013-03-12 ENCOUNTER — Encounter: Payer: Self-pay | Admitting: Gastroenterology

## 2013-03-12 VITALS — BP 90/64 | HR 71 | Ht 62.0 in | Wt 117.5 lb

## 2013-03-12 VITALS — BP 102/70 | HR 76 | Temp 97.4°F | Resp 18 | Ht 60.75 in | Wt 119.0 lb

## 2013-03-12 DIAGNOSIS — K599 Functional intestinal disorder, unspecified: Secondary | ICD-10-CM

## 2013-03-12 DIAGNOSIS — K5900 Constipation, unspecified: Secondary | ICD-10-CM | POA: Diagnosis not present

## 2013-03-12 DIAGNOSIS — B9689 Other specified bacterial agents as the cause of diseases classified elsewhere: Secondary | ICD-10-CM

## 2013-03-12 DIAGNOSIS — J069 Acute upper respiratory infection, unspecified: Secondary | ICD-10-CM | POA: Diagnosis not present

## 2013-03-12 DIAGNOSIS — A499 Bacterial infection, unspecified: Secondary | ICD-10-CM

## 2013-03-12 DIAGNOSIS — K219 Gastro-esophageal reflux disease without esophagitis: Secondary | ICD-10-CM

## 2013-03-12 DIAGNOSIS — R079 Chest pain, unspecified: Secondary | ICD-10-CM | POA: Diagnosis not present

## 2013-03-12 MED ORDER — LINACLOTIDE 145 MCG PO CAPS: 145.0000 ug | ORAL_CAPSULE | Freq: Every day | ORAL | Status: DC

## 2013-03-12 MED ORDER — AMOXICILLIN 875 MG PO TABS: 875.0000 mg | ORAL_TABLET | Freq: Two times a day (BID) | ORAL | Status: DC

## 2013-03-12 NOTE — Progress Notes (Signed)
Patient ID: ARIYONA EID MRN: [id number], DOB: [birth year], 32 y.o. Date of Encounter: 03/12/2013, 1:26 PM    Chief Complaint:  Chief Complaint  Patient presents with  . dizziness, fatigue, facial pain, HA  ? sinus infection  OTC      HPI: 32 y.o. year old female reports 5 days of h/a, fatigue, dizziness, and face pain. Now also having yellow mucus from nose.  No chest congestion, sore throat, ear pain, fever.  Using otc motrin, sudafed with no relief.     Home Meds: Current Outpatient Prescriptions on File Prior to Visit  Medication Sig Dispense Refill  . calcium elemental as carbonate (BARIATRIC TUMS ULTRA) 400 MG tablet Chew 1,000 mg by mouth as needed for heartburn.      . carbamazepine (TEGRETOL) 200 MG tablet Take 200 mg by mouth 3 (three) times daily.      . cetirizine (ZYRTEC) 10 MG tablet Take 10 mg by mouth daily.       . cimetidine (TAGAMET) 400 MG tablet Take 400 mg by mouth as needed.      . cyclobenzaprine (FLEXERIL) 10 MG tablet Take 1 tablet (10 mg total) by mouth 2 (two) times daily as needed for muscle spasms.  20 tablet  0  . docusate sodium (COLACE) 100 MG capsule Take 200 mg by mouth 2 (two) times daily.       [name] estradiol (ESTRACE) 2 MG tablet Take 2 mg by mouth at bedtime.      . Estradiol (VAGIFEM) 10 MCG TABS Place 1 tablet vaginally 2 (two) times a week. On Mondays and Thursdays      . FLUoxetine (PROZAC) 20 MG tablet Take 60 mg by mouth every morning.       . fluticasone (FLONASE) 50 MCG/ACT nasal spray Place 2 sprays into the nose daily.      . hydrOXYzine (ATARAX/VISTARIL) 50 MG tablet Take 50 mg by mouth every 4 (four) hours as needed for itching.      [name] ibuprofen (ADVIL,MOTRIN) 200 MG tablet Take 200 mg by mouth as needed for pain.      . Linaclotide (LINZESS) 145 MCG CAPS Take 1 capsule (145 mcg total) by mouth daily.  30 capsule  11  . minocycline (MINOCIN,DYNACIN) 50 MG capsule Take 50 mg by mouth daily. For acne      . naproxen sodium (ANAPROX)  220 MG tablet Take 440 mg by mouth daily as needed (for pain).      . Pseudophed-Chlophedianol-GG 30-12.5-200 MG TABS Take by mouth as needed.      [name] QUEtiapine (SEROQUEL) 50 MG tablet Take 100 mg by mouth at bedtime.       . ranitidine (ZANTAC) 75 MG tablet Take 75 mg by mouth as needed for heartburn.      . risperiDONE (RISPERDAL) 2 MG tablet Take 2 mg by mouth 2 (two) times daily.      [name] topiramate (TOPAMAX) 100 MG tablet Take 100 mg by mouth 2 (two) times daily.      . potassium chloride SA (K-DUR,KLOR-CON) 20 MEQ tablet Take 1 tablet (20 mEq total) by mouth 2 (two) times daily.  60 tablet  0   No current facility-administered medications on file prior to visit.    Allergies: No Known Allergies    Review of Systems: See HPI for pertinent ros. Others negative.    Physical Exam: Blood pressure 102/70, pulse 76, temperature 97.4 F (36.3 C), temperature source Oral, resp. rate 18, height 5'  0.75" (1.543 m), weight 119 lb (53.978 kg), last menstrual period 12/14/2003., Body mass index is 22.67 kg/(m^2). General: Well developed, well nourished,WF. in no acute distress. HEENT: Normocephalic, atraumatic, eyes without discharge, sclera non-icteric, nares are without discharge. Bilateral auditory canals clear, TM's are without perforation, pearly grey and translucent with reflective cone of light bilaterally. Oral cavity moist, posterior pharynx without exudate, erythema, peritonsillar abscess. Frontal sinuses and bilateral maxillary sinuses mildly tender with percussion. Neck: Supple. No thyromegaly. Full ROM. No lymphadenopathy. Lungs: Clear bilaterally to auscultation without wheezes, rales, or rhonchi. Breathing is unlabored. Heart: Regular rhythm. No murmurs, rubs, or gallops. Msk:  Strength and tone normal for age. Extremities/Skin: Warm and dry. No clubbing or cyanosis. No edema. No rashes or suspicious lesions. Neuro: Alert and oriented X 3. Moves all extremities spontaneously. Gait is  normal. CNII-XII grossly in tact. Psych:  Responds to questions appropriately with a normal affect.     ASSESSMENT AND PLAN:  32 y.o. year old female with  1. Bacterial upper respiratory infection - amoxicillin (AMOXIL) 875 MG tablet; Take 1 tablet (875 mg total) by mouth 2 (two) times daily.  Dispense: 20 tablet; Refill: 0 Cont otc decong prn. F/u if does not resolve.  [address] [place], [place], [facility] 03/12/2013 1:26 PM

## 2013-03-12 NOTE — Progress Notes (Signed)
This is a 32 year old [name] with chronic psychiatric difficulties and an underlying associated eating disorder.  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  However, she is doing extremely well on Linzess 5 mcg a day.  Current problems revolve around some acid reflux symptoms with burning substernal chest pain the last several weeks that prompted emergency room visit in [facility] where apparently she had a negative cardiopulmonary evaluation.  These records are not available for review.  She takes Tagamet and Zantac fairly regularly for the last several weeks with alleviation of her acid reflux symptoms.  She denies any hepatobiliary complaints, dysphagia, or systemic complaints.  She is on multiple medications listed and reviewed her chart.  The patient relates her psychiatric problems are under good control with psychiatric care.  Her symptoms are rather typical burning substernal chest pain and regurgitation, she denies Raynaud's phenomenon, and there is no history of collagen vascular disease.  Her appetite has remained fairly good and her weight has been stable.  Endoscopy in May of 2011 showed hilar hernia with acid reflux changes, small bowel biopsy did not show any evidence of celiac disease.  Current Medications, Allergies, Past Medical History, Past Surgical History, Family History and Social History were reviewed in [employer] record.  ROS: All systems were reviewed and are negative unless otherwise stated in the HPI.          Physical Exam: Blood pressure 90/64, pulse 71 and regular and weight 117 with a BMI of 21.49.  98% oxygen saturation on room air.  I cannot appreciate stigmata of chronic liver disease.  Her chest is clear and she appear to be in a regular rhythm without murmurs gallops or rubs.  Her abdomen shows no organomegaly, masses, tenderness, distention, and bowel  sounds are normal.  There is no peripheral edema, phlebitis, or swollen joints.  Mental status is normal.    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  I think she would do best only regular PPI, and have given her samples of Dexilant 60 mg a day.  This medication tends to have some diarrhea side effects, and hopefully this may help her with her chronic constipation difficulties.  I see no indication for repeat endoscopic exam at this time.  She is to continue her psychiatric followup and other medications as previously reviewed.  On reviewing her medications, does appear that she takes fairly frequent NSAIDs.  If she continues to have problems, we will do high resolution esophageal manometry for better motility documentation. No diagnosis found.

## 2013-03-12 NOTE — Patient Instructions (Signed)
Please follow up in one year with Dr. Jarold Motto.  Linzess samples were given today.

## 2013-03-24 DIAGNOSIS — F0630 Mood disorder due to known physiological condition, unspecified: Secondary | ICD-10-CM | POA: Diagnosis not present

## 2013-03-24 DIAGNOSIS — F909 Attention-deficit hyperactivity disorder, unspecified type: Secondary | ICD-10-CM | POA: Diagnosis not present

## 2013-03-30 DIAGNOSIS — F411 Generalized anxiety disorder: Secondary | ICD-10-CM | POA: Diagnosis not present

## 2013-03-30 DIAGNOSIS — F330 Major depressive disorder, recurrent, mild: Secondary | ICD-10-CM | POA: Diagnosis not present

## 2013-04-08 ENCOUNTER — Ambulatory Visit (INDEPENDENT_AMBULATORY_CARE_PROVIDER_SITE_OTHER): Payer: Federal, State, Local not specified - Other | Admitting: Physician Assistant

## 2013-04-08 VITALS — BP 115/84 | HR 70 | Ht 62.75 in | Wt 120.2 lb

## 2013-04-08 DIAGNOSIS — F410 Panic disorder [episodic paroxysmal anxiety] without agoraphobia: Secondary | ICD-10-CM

## 2013-04-08 DIAGNOSIS — F4310 Post-traumatic stress disorder, unspecified: Secondary | ICD-10-CM | POA: Diagnosis not present

## 2013-04-08 DIAGNOSIS — F39 Unspecified mood [affective] disorder: Secondary | ICD-10-CM

## 2013-04-08 MED ORDER — RISPERIDONE 2 MG PO TABS: 2.0000 mg | ORAL_TABLET | Freq: Two times a day (BID) | ORAL | Status: DC

## 2013-04-08 MED ORDER — TOPIRAMATE 100 MG PO TABS: 100.0000 mg | ORAL_TABLET | Freq: Three times a day (TID) | ORAL | Status: DC

## 2013-04-08 MED ORDER — LORAZEPAM 1 MG PO TABS: 1.0000 mg | ORAL_TABLET | Freq: Four times a day (QID) | ORAL | Status: DC | PRN

## 2013-04-08 MED ORDER — QUETIAPINE FUMARATE 50 MG PO TABS: 100.0000 mg | ORAL_TABLET | Freq: Every day | ORAL | Status: DC

## 2013-04-08 MED ORDER — FLUOXETINE HCL 20 MG PO TABS: 60.0000 mg | ORAL_TABLET | Freq: Every morning | ORAL | Status: DC

## 2013-04-09 DIAGNOSIS — R32 Unspecified urinary incontinence: Secondary | ICD-10-CM | POA: Diagnosis not present

## 2013-04-10 ENCOUNTER — Telehealth (HOSPITAL_COMMUNITY): Payer: Self-pay

## 2013-04-10 ENCOUNTER — Other Ambulatory Visit (HOSPITAL_COMMUNITY): Payer: Self-pay | Admitting: Physician Assistant

## 2013-04-10 MED ORDER — CARBAMAZEPINE 200 MG PO TABS: 200.0000 mg | ORAL_TABLET | Freq: Three times a day (TID) | ORAL | Status: DC

## 2013-04-12 ENCOUNTER — Encounter (HOSPITAL_COMMUNITY): Payer: Self-pay | Admitting: Physician Assistant

## 2013-04-12 DIAGNOSIS — F410 Panic disorder [episodic paroxysmal anxiety] without agoraphobia: Secondary | ICD-10-CM | POA: Insufficient documentation
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
## 2013-04-13 DIAGNOSIS — F909 Attention-deficit hyperactivity disorder, unspecified type: Secondary | ICD-10-CM | POA: Diagnosis not present

## 2013-04-13 DIAGNOSIS — F0630 Mood disorder due to known physiological condition, unspecified: Secondary | ICD-10-CM | POA: Diagnosis not present

## 2013-04-16 DIAGNOSIS — F411 Generalized anxiety disorder: Secondary | ICD-10-CM | POA: Diagnosis not present

## 2013-04-16 DIAGNOSIS — F330 Major depressive disorder, recurrent, mild: Secondary | ICD-10-CM | POA: Diagnosis not present

## 2013-04-20 ENCOUNTER — Ambulatory Visit (HOSPITAL_COMMUNITY)
Admission: RE | Admit: 2013-04-20 | Discharge: 2013-04-20 | Disposition: A | Discharge status: Home / Self Care | Payer: Medicare Other | Attending: Psychiatry | Admitting: Psychiatry

## 2013-04-20 DIAGNOSIS — F4310 Post-traumatic stress disorder, unspecified: Secondary | ICD-10-CM | POA: Diagnosis not present

## 2013-04-20 DIAGNOSIS — F39 Unspecified mood [affective] disorder: Secondary | ICD-10-CM | POA: Diagnosis not present
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
## 2013-04-29 ENCOUNTER — Ambulatory Visit (INDEPENDENT_AMBULATORY_CARE_PROVIDER_SITE_OTHER): Payer: Medicare Other | Admitting: Physician Assistant

## 2013-04-29 VITALS — BP 133/76 | HR 81 | Wt 121.2 lb

## 2013-04-29 DIAGNOSIS — F3160 Bipolar disorder, current episode mixed, unspecified: Secondary | ICD-10-CM | POA: Diagnosis not present

## 2013-04-29 DIAGNOSIS — F4310 Post-traumatic stress disorder, unspecified: Secondary | ICD-10-CM

## 2013-04-29 DIAGNOSIS — F411 Generalized anxiety disorder: Secondary | ICD-10-CM | POA: Diagnosis not present

## 2013-04-29 DIAGNOSIS — F319 Bipolar disorder, unspecified: Secondary | ICD-10-CM

## 2013-04-29 DIAGNOSIS — F410 Panic disorder [episodic paroxysmal anxiety] without agoraphobia: Secondary | ICD-10-CM

## 2013-04-29 MED ORDER — DIVALPROEX SODIUM 500 MG PO DR TAB: 500.0000 mg | DELAYED_RELEASE_TABLET | Freq: Three times a day (TID) | ORAL | Status: DC

## 2013-04-30 ENCOUNTER — Emergency Department (HOSPITAL_COMMUNITY)
Admission: EM | Admit: 2013-04-30 | Discharge: 2013-04-30 | Disposition: A | Discharge status: Home / Self Care | Payer: Medicare Other | Attending: Emergency Medicine | Admitting: Emergency Medicine

## 2013-04-30 ENCOUNTER — Encounter (HOSPITAL_COMMUNITY): Payer: Self-pay | Admitting: *Deleted

## 2013-04-30 ENCOUNTER — Emergency Department (HOSPITAL_COMMUNITY): Payer: Medicare Other

## 2013-04-30 DIAGNOSIS — Z8669 Personal history of other diseases of the nervous system and sense organs: Secondary | ICD-10-CM | POA: Insufficient documentation

## 2013-04-30 DIAGNOSIS — K219 Gastro-esophageal reflux disease without esophagitis: Secondary | ICD-10-CM | POA: Diagnosis not present

## 2013-04-30 DIAGNOSIS — R0602 Shortness of breath: Secondary | ICD-10-CM | POA: Diagnosis not present

## 2013-04-30 DIAGNOSIS — R110 Nausea: Secondary | ICD-10-CM | POA: Insufficient documentation

## 2013-04-30 DIAGNOSIS — F3289 Other specified depressive episodes: Secondary | ICD-10-CM | POA: Insufficient documentation

## 2013-04-30 DIAGNOSIS — Z8659 Personal history of other mental and behavioral disorders: Secondary | ICD-10-CM | POA: Insufficient documentation

## 2013-04-30 DIAGNOSIS — Z8742 Personal history of other diseases of the female genital tract: Secondary | ICD-10-CM | POA: Diagnosis not present

## 2013-04-30 DIAGNOSIS — R0789 Other chest pain: Secondary | ICD-10-CM | POA: Diagnosis not present

## 2013-04-30 DIAGNOSIS — R079 Chest pain, unspecified: Secondary | ICD-10-CM

## 2013-04-30 DIAGNOSIS — F411 Generalized anxiety disorder: Secondary | ICD-10-CM | POA: Insufficient documentation

## 2013-04-30 DIAGNOSIS — F4310 Post-traumatic stress disorder, unspecified: Secondary | ICD-10-CM | POA: Insufficient documentation

## 2013-04-30 DIAGNOSIS — K5900 Constipation, unspecified: Secondary | ICD-10-CM | POA: Diagnosis not present

## 2013-04-30 DIAGNOSIS — Z792 Long term (current) use of antibiotics: Secondary | ICD-10-CM | POA: Insufficient documentation

## 2013-04-30 DIAGNOSIS — IMO0002 Reserved for concepts with insufficient information to code with codable children: Secondary | ICD-10-CM | POA: Insufficient documentation

## 2013-04-30 DIAGNOSIS — F329 Major depressive disorder, single episode, unspecified: Secondary | ICD-10-CM | POA: Diagnosis not present

## 2013-04-30 DIAGNOSIS — M899 Disorder of bone, unspecified: Secondary | ICD-10-CM | POA: Insufficient documentation

## 2013-04-30 DIAGNOSIS — Z8719 Personal history of other diseases of the digestive system: Secondary | ICD-10-CM | POA: Insufficient documentation

## 2013-04-30 DIAGNOSIS — Z79899 Other long term (current) drug therapy: Secondary | ICD-10-CM | POA: Insufficient documentation

## 2013-04-30 LAB — D-DIMER, QUANTITATIVE: D-Dimer, Quant: <0.27 ug/mL-FEU (ref 0.00–0.48)

## 2013-04-30 NOTE — ED Provider Notes (Signed)
History     CSN: [id number]  Arrival date & time 04/30/13  1811   First MD Initiated Contact with Patient 04/30/13 1813      Chief Complaint  Patient presents with  . Chest Pain    (Consider location/radiation/quality/duration/timing/severity/associated sxs/prior treatment) Patient is a 32 y.o. female presenting with chest pain. The history is provided by the patient.  Chest Pain Pain quality: pressure and sharp   Pain radiates to:  Does not radiate Pain radiates to the back: no   Pain severity:  Mild Onset quality:  Sudden Duration:  90 minutes Timing:  Intermittent Progression:  Resolved Chronicity:  Recurrent Context comment:  Occurred while eating granola bar.  Relieved by: given ASA in field with resolution. Worsened by:  Deep breathing and movement Ineffective treatments:  None tried Associated symptoms: nausea and shortness of breath   Associated symptoms: no abdominal pain, no anxiety, no cough, no diaphoresis, no dizziness, no fever, not vomiting and no weakness   Risk factors: birth control (estradiol)     Past Medical History  Diagnosis Date  . PTSD (post-traumatic stress disorder)   . Uterine prolapse 2013  . Depression   . Hiatal hernia   . Surgical menopause   . Chronic headache   . Constipation   . Esophageal reflux     no meds  . Osteopenia   . Eating disorder   . Anxiety     Past Surgical History  Procedure Laterality Date  . Bilateral oophorectomy      bilat  . Rectocele repair    . Cesarean section      Family History  Problem Relation Age of Onset  . Breast cancer Maternal Grandmother 46  . Breast cancer Paternal Aunt 40  . Celiac disease Paternal Grandmother   . Heart disease Father   . Colon cancer Neg Hx   . Mental illness Mother   . Bipolar disorder Maternal Grandmother   . Mental illness Brother     History  Substance Use Topics  . Smoking status: Never Smoker   . Smokeless tobacco: Never Used  . Alcohol Use: No     OB History   Grav Para Term Preterm Abortions TAB SAB Ect Mult Living   3 3 3       3       Review of Systems  Constitutional: Negative for fever, chills and diaphoresis.  Respiratory: Positive for shortness of breath. Negative for cough and chest tightness.   Cardiovascular: Positive for chest pain.  Gastrointestinal: Positive for nausea. Negative for vomiting, abdominal pain, diarrhea and constipation.  Genitourinary: Negative for dysuria.  Neurological: Negative for dizziness and weakness.  All other systems reviewed and are negative.    Allergies  Review of patient's allergies indicates no known allergies.  Home Medications   Current Outpatient Rx  Name  Route  Sig  Dispense  Refill  . amoxicillin (AMOXIL) 875 MG tablet   Oral   Take 1 tablet (875 mg total) by mouth 2 (two) times daily.   20 tablet   0   . calcium elemental as carbonate (BARIATRIC TUMS ULTRA) 400 MG tablet   Oral   Chew 1,000 mg by mouth as needed for heartburn.         . carbamazepine (TEGRETOL) 200 MG tablet   Oral   Take 1 tablet (200 mg total) by mouth 3 (three) times daily.   90 tablet   1   . cetirizine (ZYRTEC) 10 MG tablet  Oral   Take 10 mg by mouth daily.          . cimetidine (TAGAMET) 400 MG tablet   Oral   Take 400 mg by mouth as needed.         . divalproex (DEPAKOTE) 500 MG DR tablet   Oral   Take 1 tablet (500 mg total) by mouth 3 (three) times daily.   30 tablet   1   . docusate sodium (COLACE) 100 MG capsule   Oral   Take 200 mg by mouth 2 (two) times daily.          [name] estradiol (ESTRACE) 2 MG tablet   Oral   Take 2 mg by mouth at bedtime.         . Estradiol (VAGIFEM) 10 MCG TABS   Vaginal   Place 1 tablet vaginally 2 (two) times a week. On Mondays and Thursdays         . FLUoxetine (PROZAC) 20 MG tablet   Oral   Take 3 tablets (60 mg total) by mouth every morning.   90 tablet   1   . fluticasone (FLONASE) 50 MCG/ACT nasal spray    Nasal   Place 2 sprays into the nose daily.         . hydrOXYzine (ATARAX/VISTARIL) 50 MG tablet   Oral   Take 50 mg by mouth every 4 (four) hours as needed for itching.         [name] ibuprofen (ADVIL,MOTRIN) 200 MG tablet   Oral   Take 200 mg by mouth as needed for pain.         . Linaclotide (LINZESS) 145 MCG CAPS   Oral   Take 1 capsule (145 mcg total) by mouth daily.   30 capsule   11   . LORazepam (ATIVAN) 1 MG tablet   Oral   Take 1 tablet (1 mg total) by mouth 4 (four) times daily as needed for anxiety.   120 tablet   0   . minocycline (MINOCIN,DYNACIN) 50 MG capsule   Oral   Take 50 mg by mouth daily. For acne         . naproxen sodium (ANAPROX) 220 MG tablet   Oral   Take 440 mg by mouth daily as needed (for pain).         . potassium chloride SA (K-DUR,KLOR-CON) 20 MEQ tablet   Oral   Take 1 tablet (20 mEq total) by mouth 2 (two) times daily.   60 tablet   0   . Pseudophed-Chlophedianol-GG 30-12.5-200 MG TABS   Oral   Take by mouth as needed.         [name] QUEtiapine (SEROQUEL) 50 MG tablet   Oral   Take 2 tablets (100 mg total) by mouth at bedtime.   60 tablet   1   . ranitidine (ZANTAC) 75 MG tablet   Oral   Take 75 mg by mouth as needed for heartburn.         . risperiDONE (RISPERDAL) 2 MG tablet   Oral   Take 1 tablet (2 mg total) by mouth 2 (two) times daily.   60 tablet   1     BP 121/72  Pulse 77  Resp 16  SpO2 100%  LMP 12/14/2003  Physical Exam  Nursing note and vitals reviewed. Constitutional: She is oriented to person, place, and time. She appears well-developed and well-nourished. No distress.  HENT:  Head: Normocephalic and atraumatic.  Mouth/Throat: Oropharynx is clear and moist.  Eyes: EOM are normal. Pupils are equal, round, and reactive to light.  Neck: Normal range of motion. Neck supple.  Cardiovascular: Normal rate, regular rhythm and normal heart sounds.  Exam reveals no friction rub.   No murmur  heard. Pulmonary/Chest: Effort normal and breath sounds normal. No respiratory distress. She has no wheezes. She has no rales. She exhibits no tenderness.  Abdominal: Soft. There is no tenderness. There is no rebound and no guarding.  Musculoskeletal: Normal range of motion. She exhibits no edema and no tenderness.  Lymphadenopathy:    She has no cervical adenopathy.  Neurological: She is alert and oriented to person, place, and time.  Skin: Skin is warm and dry. No rash noted.  Psychiatric: She has a normal mood and affect. Her behavior is normal.    ED Course  Procedures (including critical care time)  Labs Reviewed  D-DIMER, QUANTITATIVE   Dg Chest 2 View  04/30/2013   *RADIOLOGY REPORT*  Clinical Data: Chest pain, short of breath  CHEST - 2 VIEW  Comparison: None.  Findings: Mild pulmonary hyperexpansion.  Central airway thickening/peribronchial cuffing.  No focal airspace consolidation, pulmonary edema, pleural effusion or pneumothorax.  Cardiac and mediastinal contours are within normal limits.  Mild levoconvex scoliosis of the thoracolumbar lumbar junction.  No acute osseous abnormality.  IMPRESSION:  Mild pulmonary hyperexpansion and central airway thickening are nonspecific findings which can be seen in both acute and chronic bronchitis as well as in inflammatory conditions such as asthma.   Original Report Authenticated By: Malachy Moan, M.D.    Date: 04/30/2013  Rate: 81  Rhythm: normal sinus rhythm  QRS Axis: normal  Intervals: normal  ST/T Wave abnormalities: normal  Conduction Disutrbances:none  Narrative Interpretation:   Old EKG Reviewed: unchanged    1. Chest pain       MDM  18:53 PM 32 year old female with history of PTSD, depression, and anxiety presenting with about and hour and a half of substernal sharp chest pain that occurred while she was eating a granola bar. She states the pain was intermittent, lasting roughly 10 minutes at a time associated nausea  and shortness of breath. No diaphoresis. Pain worse with movement and deep breathing. She was given aspirin by EMS which completely resolved pain. She endorses history of similar back in March when she was told it was due to anxiety. Vital signs are stable. She currently has no pain in my evaluation. She is however on estradiol as she is status post hysterectomy. Given this we'll check a d-dimer, EKG and chest x-ray.  8:15 PM d dimer negative. CXR and EKG unremarkable. Patient asleep in bed. Vitals stable. No chest pain since being in department. She is young, otherwise healthy and no risk factors for ACS, story not c/w ACS. She will f/u with PCP should symptoms persist. She voiced understanding and dc'd home in stable condition.       Caren Hazy, MD 04/30/13 2134

## 2013-04-30 NOTE — ED Notes (Signed)
Per EMS:  Pt from home.  Pt started to get SOB x 3 days ago.  Pt went to the gym today at 1pm and started to get nauseated then at 430pm today, pt had intermittent CP.  Pt had relief with 324asa.  No nitro given.  18ga LAC.  4mg  zofran given for nausea.

## 2013-04-30 NOTE — ED Notes (Signed)
Pt ambulating independently w/ steady gait on d/c in no acute distress, A&Ox4. D/c instructions reviewed w/ pt, pt denies any further questions or concerns at present.  

## 2013-05-01 ENCOUNTER — Ambulatory Visit (INDEPENDENT_AMBULATORY_CARE_PROVIDER_SITE_OTHER): Payer: Medicare Other | Admitting: Family Medicine

## 2013-05-01 ENCOUNTER — Encounter: Payer: Self-pay | Admitting: Family Medicine

## 2013-05-01 VITALS — BP 100/68 | HR 72 | Temp 98.0°F | Resp 14 | Wt 122.0 lb

## 2013-05-01 DIAGNOSIS — R079 Chest pain, unspecified: Secondary | ICD-10-CM | POA: Diagnosis not present

## 2013-05-01 DIAGNOSIS — F410 Panic disorder [episodic paroxysmal anxiety] without agoraphobia: Secondary | ICD-10-CM

## 2013-05-01 DIAGNOSIS — F411 Generalized anxiety disorder: Secondary | ICD-10-CM
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
## 2013-05-03 NOTE — ED Provider Notes (Signed)
I saw and evaluated the patient, reviewed the resident's note and I agree with the findings and plan.  Patient seen and evaluated for chest pain. Patient has low cardiac risk, other than taking estrogen supplements due to total hysterectomy. The patient's workup today was negative, discharge to home.  Gilda Crease, MD 05/03/13 [phone]

## 2013-05-04 ENCOUNTER — Encounter (HOSPITAL_COMMUNITY): Payer: Self-pay | Admitting: Physician Assistant

## 2013-05-04 DIAGNOSIS — F319 Bipolar disorder, unspecified: Secondary | ICD-10-CM | POA: Insufficient documentation
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
## 2013-05-11 ENCOUNTER — Ambulatory Visit (INDEPENDENT_AMBULATORY_CARE_PROVIDER_SITE_OTHER): Payer: Medicare Other | Admitting: Psychiatry

## 2013-05-11 DIAGNOSIS — F319 Bipolar disorder, unspecified: Secondary | ICD-10-CM

## 2013-05-11 DIAGNOSIS — F4310 Post-traumatic stress disorder, unspecified: Secondary | ICD-10-CM

## 2013-05-12 DIAGNOSIS — F0630 Mood disorder due to known physiological condition, unspecified: Secondary | ICD-10-CM | POA: Diagnosis not present

## 2013-05-12 DIAGNOSIS — F909 Attention-deficit hyperactivity disorder, unspecified type: Secondary | ICD-10-CM | POA: Diagnosis not present
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
## 2013-05-18 ENCOUNTER — Encounter: Payer: Self-pay | Admitting: Physician Assistant

## 2013-05-18 ENCOUNTER — Ambulatory Visit (INDEPENDENT_AMBULATORY_CARE_PROVIDER_SITE_OTHER): Payer: Medicare Other | Admitting: Physician Assistant

## 2013-05-18 ENCOUNTER — Other Ambulatory Visit (HOSPITAL_COMMUNITY): Payer: Self-pay | Admitting: *Deleted

## 2013-05-18 VITALS — BP 98/60 | HR 84 | Temp 97.5°F | Resp 18 | Ht 60.5 in | Wt 119.0 lb

## 2013-05-18 DIAGNOSIS — G43909 Migraine, unspecified, not intractable, without status migrainosus: Secondary | ICD-10-CM

## 2013-05-18 DIAGNOSIS — F410 Panic disorder [episodic paroxysmal anxiety] without agoraphobia: Secondary | ICD-10-CM

## 2013-05-18 MED ORDER — LORAZEPAM 1 MG PO TABS: 1.0000 mg | ORAL_TABLET | Freq: Four times a day (QID) | ORAL | Status: DC | PRN

## 2013-05-18 MED ORDER — HYDROCODONE-ACETAMINOPHEN 5-325 MG PO TABS: 1.0000 | ORAL_TABLET | Freq: Four times a day (QID) | ORAL | Status: DC | PRN

## 2013-05-18 NOTE — Progress Notes (Signed)
Patient ID: MIKAEL DEBELL MRN: [id number], DOB: June 25, 1981, 32 y.o. Date of Encounter: 05/18/2013, 11:04 AM    Chief Complaint:  Chief Complaint  Patient presents with  . Nausea    X 1mos seems to be worse  . headaches    X 1 mos seems to be worse  . fatique  . dizzyiness     HPI: 32 y.o. year old white female presents with c/o headaches. Says she has been having h/a frequently over past 4-6 weeks. Headache is behind hre eye, then goes to top/front of head. Sometimes is behind left ey, sometimes is behind right eye. She has associated nausea. Has vomited bout 4-5 times with it. She has photophobia and phonophobia with it also.  She says she saw a neurologist about headaches years ago but has not required treatment of headaches in long time until now.  Says psych decreased Topamax recently "because they added new medication-Depakote."  No other recent medication changes.    Home Meds: See attached medication section for any medications that were entered at today's visit. The computer does not put those onto this list.The following list is a list of meds entered prior to today's visit.   Current Outpatient Prescriptions on File Prior to Visit  Medication Sig Dispense Refill  . calcium elemental as carbonate (BARIATRIC TUMS ULTRA) 400 MG tablet Chew 1,000 mg by mouth as needed for heartburn.      . carbamazepine (TEGRETOL) 200 MG tablet Take 1 tablet (200 mg total) by mouth 3 (three) times daily.  90 tablet  1  . cetirizine (ZYRTEC) 10 MG tablet Take 10 mg by mouth daily.       . cimetidine (TAGAMET) 400 MG tablet Take 400 mg by mouth 2 (two) times daily as needed (for heartburm).       . docusate sodium (COLACE) 100 MG capsule Take 200 mg by mouth 2 (two) times daily.       [name] estradiol (ESTRACE) 2 MG tablet Take 2 mg by mouth at bedtime.      . Estradiol (VAGIFEM) 10 MCG TABS Place 1 tablet vaginally 2 (two) times a week. On Mondays and Thursdays      . FLUoxetine (PROZAC) 20 MG  tablet Take 3 tablets (60 mg total) by mouth every morning.  90 tablet  1  . fluticasone (FLONASE) 50 MCG/ACT nasal spray Place 2 sprays into the nose daily.      . hydrOXYzine (ATARAX/VISTARIL) 50 MG tablet Take 50 mg by mouth every 4 (four) hours as needed for itching.      [name] ibuprofen (ADVIL,MOTRIN) 200 MG tablet Take 200 mg by mouth as needed for pain.      . Linaclotide (LINZESS) 145 MCG CAPS Take 1 capsule (145 mcg total) by mouth daily.  30 capsule  11  . minocycline (MINOCIN,DYNACIN) 50 MG capsule Take 50 mg by mouth daily. For acne      . potassium chloride SA (K-DUR,KLOR-CON) 20 MEQ tablet Take 1 tablet (20 mEq total) by mouth 2 (two) times daily.  60 tablet  0  . QUEtiapine (SEROQUEL) 50 MG tablet Take 2 tablets (100 mg total) by mouth at bedtime.  60 tablet  1  . ranitidine (ZANTAC) 75 MG tablet Take 75 mg by mouth as needed for heartburn.      . risperiDONE (RISPERDAL) 2 MG tablet Take 1 tablet (2 mg total) by mouth 2 (two) times daily.  60 tablet  1  . divalproex (DEPAKOTE) 500  MG DR tablet Take 1 tablet (500 mg total) by mouth 3 (three) times daily.  30 tablet  1   No current facility-administered medications on file prior to visit.    Allergies: No Known Allergies    Review of Systems: See HPI for pertinent ROS. All other ROS negative.    Physical Exam: Blood pressure 98/60, pulse 84, temperature 97.5 F (36.4 C), temperature source Oral, resp. rate 18, height 5' 0.5" (1.537 m), weight 119 lb (53.978 kg), last menstrual period 12/14/2003., Body mass index is 22.85 kg/(m^2). General: WNWD WF.  Appears in no acute distress. Lungs: Clear bilaterally to auscultation without wheezes, rales, or rhonchi. Breathing is unlabored. Heart: Regular rhythm. No murmurs, rubs, or gallops. Msk:  Strength and tone normal for age. Extremities/Skin: Warm and dry. No edema. Neuro: Alert and oriented X 3. Moves all extremities spontaneously. Gait is normal. CNII-XII grossly in  tact.Extraoccular movements intact. Pupillary response to light is normal. 5/5 strength in all extremities.  Psych:  Responds to questions appropriately with a normal affect.     ASSESSMENT AND PLAN:  32 y.o. year old female with  1. Migraines Reviewed her current medications. Cannot use triptans or tramadol with SSRI secondary to potential Serotonin Syndrome. She will need to f/u with Psych regarding this as well as the Topamax.  In the mean time, will give hydrocodone to use prn severe pain. She sys she is on disability. She does have children ages 40,6, 80 but says she has used hydrocodone beforre and it does no make her "too loopy" to care for her children. For mild headache, use Tylenol, Motrin. Only use Norco for more sever pain. F/U with Psych for furhter management of preventive and abortive medictions.   - HYDROcodone-acetaminophen (NORCO/VICODIN) 5-325 MG per tablet; Take 1 tablet by mouth every 6 (six) hours as needed for pain.  Dispense: 30 tablet; Refill: 0   Signed, [address] [place], [place], [facility] 05/18/2013 11:04 AM

## 2013-05-25 ENCOUNTER — Ambulatory Visit (INDEPENDENT_AMBULATORY_CARE_PROVIDER_SITE_OTHER): Payer: Medicare Other | Admitting: Psychiatry

## 2013-05-25 DIAGNOSIS — F319 Bipolar disorder, unspecified: Secondary | ICD-10-CM

## 2013-05-25 DIAGNOSIS — F4310 Post-traumatic stress disorder, unspecified: Secondary | ICD-10-CM
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
## 2013-06-09 ENCOUNTER — Ambulatory Visit (INDEPENDENT_AMBULATORY_CARE_PROVIDER_SITE_OTHER): Payer: Medicare Other | Admitting: Physician Assistant

## 2013-06-09 VITALS — BP 118/72 | HR 57 | Ht 62.0 in | Wt 121.0 lb

## 2013-06-09 DIAGNOSIS — F410 Panic disorder [episodic paroxysmal anxiety] without agoraphobia: Secondary | ICD-10-CM

## 2013-06-09 DIAGNOSIS — F319 Bipolar disorder, unspecified: Secondary | ICD-10-CM

## 2013-06-09 DIAGNOSIS — F4310 Post-traumatic stress disorder, unspecified: Secondary | ICD-10-CM

## 2013-06-09 MED ORDER — RISPERIDONE 2 MG PO TABS: 2.0000 mg | ORAL_TABLET | Freq: Two times a day (BID) | ORAL | Status: DC

## 2013-06-09 MED ORDER — QUETIAPINE FUMARATE 50 MG PO TABS: 100.0000 mg | ORAL_TABLET | Freq: Every day | ORAL | Status: DC

## 2013-06-09 MED ORDER — DIVALPROEX SODIUM ER 500 MG PO TB24: 500.0000 mg | ORAL_TABLET | Freq: Two times a day (BID) | ORAL | Status: DC

## 2013-06-09 MED ORDER — FLUOXETINE HCL 20 MG PO TABS: 60.0000 mg | ORAL_TABLET | Freq: Every morning | ORAL | Status: DC

## 2013-06-09 MED ORDER — CARBAMAZEPINE 200 MG PO TABS: 200.0000 mg | ORAL_TABLET | Freq: Three times a day (TID) | ORAL | Status: DC

## 2013-06-11 ENCOUNTER — Ambulatory Visit (INDEPENDENT_AMBULATORY_CARE_PROVIDER_SITE_OTHER): Payer: Medicare Other | Admitting: Psychiatry

## 2013-06-11 ENCOUNTER — Encounter (HOSPITAL_COMMUNITY): Payer: Self-pay | Admitting: Physician Assistant

## 2013-06-11 DIAGNOSIS — F4310 Post-traumatic stress disorder, unspecified: Secondary | ICD-10-CM

## 2013-06-11 DIAGNOSIS — F319 Bipolar disorder, unspecified: Secondary | ICD-10-CM | POA: Diagnosis not present
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
## 2013-06-12 ENCOUNTER — Telehealth (HOSPITAL_COMMUNITY): Payer: Self-pay

## 2013-06-16 ENCOUNTER — Other Ambulatory Visit (HOSPITAL_COMMUNITY): Payer: Self-pay | Admitting: Physician Assistant

## 2013-06-17 ENCOUNTER — Ambulatory Visit (INDEPENDENT_AMBULATORY_CARE_PROVIDER_SITE_OTHER): Payer: Medicare Other | Admitting: Psychiatry

## 2013-06-17 DIAGNOSIS — F319 Bipolar disorder, unspecified: Secondary | ICD-10-CM | POA: Diagnosis not present

## 2013-06-17 DIAGNOSIS — F4310 Post-traumatic stress disorder, unspecified: Secondary | ICD-10-CM | POA: Diagnosis not present

## 2013-06-17 NOTE — Progress Notes (Signed)
   THERAPIST PROGRESS NOTE  Session Time: 2:00-2:50 pm  Participation Level: Active  Behavioral Response: CasualAlertAnxious and Depressed  Type of Therapy: Individual Therapy  Treatment Goals addressed: Anxiety and Diagnosis: Self-esteem  Interventions: Solution Focused and Biofeedback  Summary: Priyah Schmuck Klas is a 32 y.o. female who presents with depressed and anxious mood and affect. Pt presents with awkward social skills and mannerisms. Pt commented on how this has caused her struggles throughout her life but believes it because people experience her as "too smart". Pt said she felt calm, but presented with severe anxiety and behaviors such as quivering voice and body twitching. Pt states her main goal is to return to college so she can become independent and focused on themes of how to return to school to finish incomplete's from 2006 at [employer] and have her student loans paid off. Pt agreed to log into the biofeedback program at the end of the session to identify the severity of her anxiety and Pt was shocked to see her stress at a maximum level. Pt was open to writer educating Pt on how she presents with severe anxiety and open to further discussions about how to help her learn to manage her anxiety symptoms and create feelings of calm.  Suicidal/Homicidal: Nowithout intent/plan  Therapist Response: Assessed overall level of functioning and anxiety level, worked on [employer] of education and taught a breathing technique to manage anxiety.   Plan: Return again in 1 weeks.  Diagnosis: Axis I: Bipolar Disorder    Axis II: No diagnosis    Shandell Jallow E, LCSW 06/17/2013

## 2013-06-18 DIAGNOSIS — F0630 Mood disorder due to known physiological condition, unspecified: Secondary | ICD-10-CM | POA: Diagnosis not present

## 2013-06-18 DIAGNOSIS — F909 Attention-deficit hyperactivity disorder, unspecified type: Secondary | ICD-10-CM | POA: Diagnosis not present

## 2013-06-19 ENCOUNTER — Other Ambulatory Visit (HOSPITAL_COMMUNITY): Payer: Self-pay | Admitting: Physician Assistant

## 2013-06-25 ENCOUNTER — Ambulatory Visit (INDEPENDENT_AMBULATORY_CARE_PROVIDER_SITE_OTHER): Payer: Medicare Other | Admitting: Psychiatry

## 2013-06-25 DIAGNOSIS — F319 Bipolar disorder, unspecified: Secondary | ICD-10-CM

## 2013-06-25 DIAGNOSIS — F4310 Post-traumatic stress disorder, unspecified: Secondary | ICD-10-CM
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
## 2013-06-26 ENCOUNTER — Telehealth (HOSPITAL_COMMUNITY): Payer: Self-pay

## 2013-06-26 ENCOUNTER — Telehealth (HOSPITAL_COMMUNITY): Payer: Self-pay | Admitting: Physician Assistant

## 2013-06-26 NOTE — Telephone Encounter (Signed)
Returned patient's call regarding increased depression, daytime somnolence, and weight gain. At last appointment changed Depakote DR to Depakote ER 500 mg twice daily. Instructed patient to begin taking Depakote ER 1000 mg at bedtime, and none in the morning in hopes that this will help her to feel less sedated and depressed during the day. Patient instructed to call and report on response in one week. Patient has an appointment on September 3.

## 2013-06-26 NOTE — Telephone Encounter (Signed)
11:03am 06/26/13 Patient called need to speak with you about her depression getting worst the past week sleeping more during the day and have been gaining weight....Regina Steele

## 2013-07-01 ENCOUNTER — Ambulatory Visit (HOSPITAL_COMMUNITY): Payer: Self-pay | Admitting: Psychiatry

## 2013-07-06 ENCOUNTER — Telehealth (HOSPITAL_COMMUNITY): Payer: Self-pay | Admitting: *Deleted
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
## 2013-07-08 ENCOUNTER — Ambulatory Visit (HOSPITAL_COMMUNITY): Payer: Self-pay | Admitting: Psychiatry

## 2013-07-08 ENCOUNTER — Encounter (HOSPITAL_COMMUNITY): Payer: Self-pay | Admitting: Emergency Medicine

## 2013-07-08 ENCOUNTER — Telehealth (HOSPITAL_COMMUNITY): Payer: Self-pay | Admitting: Physician Assistant

## 2013-07-08 ENCOUNTER — Ambulatory Visit (HOSPITAL_COMMUNITY)
Admission: RE | Admit: 2013-07-08 | Discharge: 2013-07-08 | Disposition: A | Discharge status: Home / Self Care | Payer: Medicare Other | Attending: Psychiatry | Admitting: Psychiatry

## 2013-07-08 ENCOUNTER — Emergency Department (EMERGENCY_DEPARTMENT_HOSPITAL)
Admission: EM | Admit: 2013-07-08 | Discharge: 2013-07-09 | Disposition: A | Discharge status: Other Acute Inpatient Hospital | Payer: Medicare Other | Source: Home / Self Care | Attending: Emergency Medicine | Admitting: Emergency Medicine

## 2013-07-08 DIAGNOSIS — F4310 Post-traumatic stress disorder, unspecified: Secondary | ICD-10-CM | POA: Diagnosis not present

## 2013-07-08 DIAGNOSIS — Z79899 Other long term (current) drug therapy: Secondary | ICD-10-CM | POA: Diagnosis not present

## 2013-07-08 DIAGNOSIS — F332 Major depressive disorder, recurrent severe without psychotic features: Secondary | ICD-10-CM | POA: Diagnosis not present

## 2013-07-08 DIAGNOSIS — R45851 Suicidal ideations: Secondary | ICD-10-CM | POA: Diagnosis not present

## 2013-07-08 DIAGNOSIS — F341 Dysthymic disorder: Secondary | ICD-10-CM

## 2013-07-08 DIAGNOSIS — F410 Panic disorder [episodic paroxysmal anxiety] without agoraphobia: Secondary | ICD-10-CM

## 2013-07-08 DIAGNOSIS — F331 Major depressive disorder, recurrent, moderate: Secondary | ICD-10-CM | POA: Diagnosis not present

## 2013-07-08 DIAGNOSIS — F3130 Bipolar disorder, current episode depressed, mild or moderate severity, unspecified: Secondary | ICD-10-CM | POA: Diagnosis not present

## 2013-07-08 DIAGNOSIS — F319 Bipolar disorder, unspecified: Secondary | ICD-10-CM

## 2013-07-08 DIAGNOSIS — F411 Generalized anxiety disorder: Secondary | ICD-10-CM | POA: Diagnosis not present

## 2013-07-08 DIAGNOSIS — F39 Unspecified mood [affective] disorder: Secondary | ICD-10-CM | POA: Diagnosis not present

## 2013-07-08 LAB — BASIC METABOLIC PANEL
Calcium: 8.7 mg/dL (ref 8.4–10.5)
Creatinine, Ser: 0.73 mg/dL (ref 0.50–1.10)
GFR calc Af Amer: >90 mL/min (ref >90)

## 2013-07-08 LAB — CBC WITH DIFFERENTIAL/PLATELET
Basophils Absolute: 0.1 10*3/uL (ref 0.0–0.1)
Basophils Relative: 1 % (ref 0–1)
Eosinophils Absolute: 0.3 10*3/uL (ref 0.0–0.7)
Eosinophils Relative: 4 % (ref 0–5)
HCT: 39.9 % (ref 36.0–46.0)
MCH: 30.1 pg (ref 26.0–34.0)
MCHC: 33.3 g/dL (ref 30.0–36.0)
MCV: 90.3 fL (ref 78.0–100.0)
Monocytes Absolute: 0.5 10*3/uL (ref 0.1–1.0)
RDW: 12.8 % (ref 11.5–15.5)

## 2013-07-08 LAB — RAPID URINE DRUG SCREEN, HOSP PERFORMED
Amphetamines: NOT DETECTED
Barbiturates: NOT DETECTED

## 2013-07-08 LAB — ETHANOL: Alcohol, Ethyl (B): <11 mg/dL (ref 0–11)

## 2013-07-08 MED ORDER — LORAZEPAM 1 MG PO TABS: 1.0000 mg | ORAL_TABLET | Freq: Four times a day (QID) | ORAL | Status: DC | PRN

## 2013-07-08 MED ORDER — IBUPROFEN 200 MG PO TABS
200.0000 mg | ORAL_TABLET | ORAL | Status: DC | PRN
  Administered 2013-07-09: 200 mg via ORAL
  Filled 2013-07-08: qty 1

## 2013-07-08 MED ORDER — LINACLOTIDE 145 MCG PO CAPS
145.0000 ug | ORAL_CAPSULE | Freq: Every day | ORAL | Status: DC
  Administered 2013-07-09: 145 ug via ORAL
  Filled 2013-07-08: qty 1

## 2013-07-08 MED ORDER — QUETIAPINE FUMARATE 100 MG PO TABS
100.0000 mg | ORAL_TABLET | Freq: Every day | ORAL | Status: DC
  Administered 2013-07-08: 100 mg via ORAL
  Filled 2013-07-08: qty 1

## 2013-07-08 MED ORDER — MINOCYCLINE HCL 50 MG PO CAPS
50.0000 mg | ORAL_CAPSULE | Freq: Every day | ORAL | Status: DC
  Administered 2013-07-09: 50 mg via ORAL
  Filled 2013-07-08: qty 1

## 2013-07-08 MED ORDER — ESTRADIOL 2 MG PO TABS
2.0000 mg | ORAL_TABLET | Freq: Every day | ORAL | Status: DC
  Administered 2013-07-08: 2 mg via ORAL
  Filled 2013-07-08 (×3): qty 1

## 2013-07-08 MED ORDER — HYDROCODONE-ACETAMINOPHEN 5-325 MG PO TABS
1.0000 | ORAL_TABLET | Freq: Four times a day (QID) | ORAL | Status: DC | PRN
  Administered 2013-07-08: 1 via ORAL
  Filled 2013-07-08: qty 1

## 2013-07-08 MED ORDER — CARBAMAZEPINE 200 MG PO TABS
200.0000 mg | ORAL_TABLET | Freq: Three times a day (TID) | ORAL | Status: DC
  Administered 2013-07-08 – 2013-07-09 (×3): 200 mg via ORAL
  Filled 2013-07-08 (×5): qty 1

## 2013-07-08 MED ORDER — FLUOXETINE HCL 20 MG PO TABS
60.0000 mg | ORAL_TABLET | Freq: Every morning | ORAL | Status: DC
  Administered 2013-07-09: 60 mg via ORAL
  Filled 2013-07-08: qty 3

## 2013-07-08 MED ORDER — RISPERIDONE 2 MG PO TABS
2.0000 mg | ORAL_TABLET | Freq: Two times a day (BID) | ORAL | Status: DC
  Administered 2013-07-08 – 2013-07-09 (×2): 2 mg via ORAL
  Filled 2013-07-08 (×2): qty 1

## 2013-07-08 MED ORDER — TOPIRAMATE 25 MG PO TABS
100.0000 mg | ORAL_TABLET | Freq: Every day | ORAL | Status: DC
  Administered 2013-07-08 – 2013-07-09 (×2): 100 mg via ORAL
  Filled 2013-07-08 (×2): qty 4

## 2013-07-08 NOTE — ED Provider Notes (Signed)
CSN: [id number]     Arrival date & time 07/08/13  1821 History   First MD Initiated Contact with Patient 07/08/13 1856     Chief Complaint  Patient presents with  . Medical Clearance   (Consider location/radiation/quality/duration/timing/severity/associated sxs/prior Treatment) HPI Comments: Patient presents to the ER for evaluation of depression. Patient reports that she has been having increased depression for more than a month. The last 5 or 6 days she has been having suicidal ideas. She talked to her psychiatrist briefly today and had her Depakote stopped, but feels like she might need hospitalization at this time.   Past Medical History  Diagnosis Date  . PTSD (post-traumatic stress disorder)   . Uterine prolapse 2013  . Depression   . Hiatal hernia   . Surgical menopause   . Chronic headache   . Constipation   . Esophageal reflux     no meds  . Osteopenia   . Eating disorder   . Anxiety    Past Surgical History  Procedure Laterality Date  . Bilateral oophorectomy      bilat  . Rectocele repair    . Cesarean section     Family History  Problem Relation Age of Onset  . Breast cancer Maternal Grandmother 22  . Breast cancer Paternal Aunt 40  . Celiac disease Paternal Grandmother   . Heart disease Father   . Colon cancer Neg Hx   . Mental illness Mother   . Bipolar disorder Maternal Grandmother   . Mental illness Brother    History  Substance Use Topics  . Smoking status: Never Smoker   . Smokeless tobacco: Never Used  . Alcohol Use: No   OB History   Grav Para Term Preterm Abortions TAB SAB Ect Mult Living   3 3 3       3      Review of Systems  Psychiatric/Behavioral: Positive for suicidal ideas and dysphoric mood.  All other systems reviewed and are negative.    Allergies  Review of patient's allergies indicates no known allergies.  Home Medications   Current Outpatient Rx  Name  Route  Sig  Dispense  Refill  . carbamazepine (TEGRETOL) 200 MG  tablet   Oral   Take 1 tablet (200 mg total) by mouth 3 (three) times daily.   90 tablet   1   . cetirizine (ZYRTEC) 10 MG tablet   Oral   Take 10 mg by mouth daily.          [name] docusate sodium (COLACE) 100 MG capsule   Oral   Take 200 mg by mouth 2 (two) times daily.          [name] estradiol (ESTRACE) 2 MG tablet   Oral   Take 2 mg by mouth at bedtime.         . Estradiol (VAGIFEM) 10 MCG TABS   Vaginal   Place 1 tablet vaginally 2 (two) times a week. On Mondays and Thursdays         . FLUoxetine (PROZAC) 20 MG tablet   Oral   Take 3 tablets (60 mg total) by mouth every morning.   90 tablet   1   . fluticasone (FLONASE) 50 MCG/ACT nasal spray   Nasal   Place 2 sprays into the nose daily.         [name] HYDROcodone-acetaminophen (NORCO/VICODIN) 5-325 MG per tablet   Oral   Take 1 tablet by mouth every 6 (six) hours as needed for  pain.   30 tablet   0   . ibuprofen (ADVIL,MOTRIN) 200 MG tablet   Oral   Take 200 mg by mouth as needed for pain.         . Linaclotide (LINZESS) 145 MCG CAPS   Oral   Take 1 capsule (145 mcg total) by mouth daily.   30 capsule   11   . LORazepam (ATIVAN) 1 MG tablet   Oral   Take 1 tablet (1 mg total) by mouth 4 (four) times daily as needed for anxiety.   120 tablet   0   . minocycline (MINOCIN,DYNACIN) 50 MG capsule   Oral   Take 50 mg by mouth daily. For acne         . QUEtiapine (SEROQUEL) 50 MG tablet   Oral   Take 2 tablets (100 mg total) by mouth at bedtime.   60 tablet   1   . risperiDONE (RISPERDAL) 2 MG tablet   Oral   Take 1 tablet (2 mg total) by mouth 2 (two) times daily.   60 tablet   1   . topiramate (TOPAMAX) 100 MG tablet      TAKE 1 TABLET (100 MG TOTAL) BY MOUTH 3 (THREE) TIMES DAILY.   90 tablet   1    BP 113/76  Pulse 58  Resp 16  SpO2 94%  LMP 12/14/2003 Physical Exam  Constitutional: She is oriented to person, place, and time. She appears well-developed and well-nourished. No  distress.  HENT:  Head: Normocephalic and atraumatic.  Right Ear: Hearing normal.  Left Ear: Hearing normal.  Nose: Nose normal.  Mouth/Throat: Oropharynx is clear and moist and mucous membranes are normal.  Eyes: Conjunctivae and EOM are normal. Pupils are equal, round, and reactive to light.  Neck: Normal range of motion. Neck supple.  Cardiovascular: Regular rhythm, S1 normal and S2 normal.  Exam reveals no gallop and no friction rub.   No murmur heard. Pulmonary/Chest: Effort normal and breath sounds normal. No respiratory distress. She exhibits no tenderness.  Abdominal: Soft. Normal appearance and bowel sounds are normal. There is no hepatosplenomegaly. There is no tenderness. There is no rebound, no guarding, no tenderness at McBurney's point and negative Murphy's sign. No hernia.  Musculoskeletal: Normal range of motion.  Neurological: She is alert and oriented to person, place, and time. She has normal strength. No cranial nerve deficit or sensory deficit. Coordination normal. GCS eye subscore is 4. GCS verbal subscore is 5. GCS motor subscore is 6.  Skin: Skin is warm, dry and intact. No rash noted. No cyanosis.  Psychiatric: She has a normal mood and affect. Her speech is normal and behavior is normal. Thought content normal.    ED Course  Procedures (including critical care time) Labs Review Labs Reviewed  CBC WITH DIFFERENTIAL  BASIC METABOLIC PANEL  ETHANOL  URINE RAPID DRUG SCREEN (HOSP PERFORMED)   Imaging Review No results found.  MDM  Diagnosis: Depression with suicidal ideation  The patient presented for evaluation of progressively worsening depression now feeling suicidal. Patient feels like she needs hospitalization. Medical workup exam performed, we'll move patient to psychiatric ED for continued workup.    Gilda Crease, MD 07/08/13 Ebony Cargo

## 2013-07-08 NOTE — ED Notes (Addendum)
Pt. And belongings wanded and searched by security. Pt. Has 1 belongings bag and 1 purse. Pt. In new blue scrubs. Pt. Has pant, shirt, bra,panty and 2 gold wedding rings, two cell phone, car keys and flip flops. Pt. Belongings locked up at the nurses station in triage. Pt. Wedding rings locked up by security. See pt. Valuable envelope in security.

## 2013-07-08 NOTE — ED Notes (Signed)
Pt presents to the Ed with a need for medical clearance.  Pt has had suicidal thoughts that she has not been able to get rid of since Friday.  Pt has a history of depression and suicidal ideations.

## 2013-07-08 NOTE — Telephone Encounter (Signed)
Returned patient's phone call. Patient reports that she is increasingly depressed and has been having suicidal thoughts, and is sleeping 12 hours daily. She feels that the Depakote is contributing to this. She denies any further racing thoughts for manic behavior. She cut down on the Depakote dose to 500 mg at bedtime. We will discontinue the Depakote altogether. She has a followup appointment in approximately one week. She is encouraged to call back if she does not see improvement.
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
## 2013-07-09 ENCOUNTER — Encounter (HOSPITAL_COMMUNITY): Payer: Self-pay | Admitting: *Deleted

## 2013-07-09 ENCOUNTER — Inpatient Hospital Stay (HOSPITAL_COMMUNITY)
Admission: AD | Admit: 2013-07-09 | Discharge: 2013-07-13 | DRG: 885 | Disposition: A | Discharge status: Home / Self Care | Payer: Medicare Other | Source: Intra-hospital | Attending: Psychiatry | Admitting: Psychiatry

## 2013-07-09 DIAGNOSIS — F3130 Bipolar disorder, current episode depressed, mild or moderate severity, unspecified: Secondary | ICD-10-CM

## 2013-07-09 DIAGNOSIS — F411 Generalized anxiety disorder: Secondary | ICD-10-CM | POA: Diagnosis present

## 2013-07-09 DIAGNOSIS — R45851 Suicidal ideations: Secondary | ICD-10-CM

## 2013-07-09 DIAGNOSIS — F4310 Post-traumatic stress disorder, unspecified: Secondary | ICD-10-CM

## 2013-07-09 DIAGNOSIS — F319 Bipolar disorder, unspecified: Principal | ICD-10-CM | POA: Diagnosis present

## 2013-07-09 DIAGNOSIS — Z79899 Other long term (current) drug therapy: Secondary | ICD-10-CM

## 2013-07-09 MED ORDER — MINOCYCLINE HCL 50 MG PO CAPS
50.0000 mg | ORAL_CAPSULE | Freq: Every day | ORAL | Status: DC
  Administered 2013-07-10 – 2013-07-13 (×4): 50 mg via ORAL
  Filled 2013-07-09: qty 1
  Filled 2013-07-09: qty 3
  Filled 2013-07-09 (×4): qty 1

## 2013-07-09 MED ORDER — MAGNESIUM HYDROXIDE 400 MG/5ML PO SUSP: 30.0000 mL | Freq: Every day | ORAL | Status: DC | PRN

## 2013-07-09 MED ORDER — CARBAMAZEPINE 200 MG PO TABS
200.0000 mg | ORAL_TABLET | Freq: Three times a day (TID) | ORAL | Status: DC
  Administered 2013-07-10 – 2013-07-12 (×7): 200 mg via ORAL
  Filled 2013-07-09 (×13): qty 1

## 2013-07-09 MED ORDER — LORAZEPAM 1 MG PO TABS: 1.0000 mg | ORAL_TABLET | Freq: Three times a day (TID) | ORAL | Status: DC | PRN

## 2013-07-09 MED ORDER — ESTRADIOL 1 MG PO TABS
2.0000 mg | ORAL_TABLET | Freq: Every day | ORAL | Status: DC
  Administered 2013-07-09 – 2013-07-12 (×4): 2 mg via ORAL
  Filled 2013-07-09 (×4): qty 1
  Filled 2013-07-09: qty 6
  Filled 2013-07-09 (×2): qty 1

## 2013-07-09 MED ORDER — FLUOXETINE HCL 20 MG PO TABS
60.0000 mg | ORAL_TABLET | Freq: Every morning | ORAL | Status: DC
  Administered 2013-07-10 – 2013-07-13 (×4): 60 mg via ORAL
  Filled 2013-07-09: qty 9
  Filled 2013-07-09 (×5): qty 3

## 2013-07-09 MED ORDER — LINACLOTIDE 145 MCG PO CAPS
145.0000 ug | ORAL_CAPSULE | Freq: Every day | ORAL | Status: DC
  Administered 2013-07-10 – 2013-07-12 (×3): 145 ug via ORAL
  Filled 2013-07-09 (×5): qty 1

## 2013-07-09 MED ORDER — TOPIRAMATE 100 MG PO TABS
100.0000 mg | ORAL_TABLET | ORAL | Status: DC
  Administered 2013-07-09 – 2013-07-13 (×12): 100 mg via ORAL
  Filled 2013-07-09: qty 1
  Filled 2013-07-09: qty 9
  Filled 2013-07-09: qty 1
  Filled 2013-07-09: qty 9
  Filled 2013-07-09: qty 1
  Filled 2013-07-09 (×2): qty 9
  Filled 2013-07-09 (×7): qty 1
  Filled 2013-07-09: qty 9
  Filled 2013-07-09 (×3): qty 1
  Filled 2013-07-09: qty 9
  Filled 2013-07-09 (×2): qty 1

## 2013-07-09 MED ORDER — RISPERIDONE 2 MG PO TABS
2.0000 mg | ORAL_TABLET | Freq: Two times a day (BID) | ORAL | Status: DC
  Administered 2013-07-09 – 2013-07-13 (×8): 2 mg via ORAL
  Filled 2013-07-09 (×2): qty 6
  Filled 2013-07-09 (×3): qty 1
  Filled 2013-07-09: qty 6
  Filled 2013-07-09: qty 1
  Filled 2013-07-09: qty 6
  Filled 2013-07-09 (×7): qty 1

## 2013-07-09 MED ORDER — ALUM & MAG HYDROXIDE-SIMETH 200-200-20 MG/5ML PO SUSP: 30.0000 mL | ORAL | Status: DC | PRN

## 2013-07-09 MED ORDER — ACETAMINOPHEN 325 MG PO TABS
650.0000 mg | ORAL_TABLET | Freq: Four times a day (QID) | ORAL | Status: DC | PRN
Start: 2013-07-09 — End: 2013-07-13

## 2013-07-09 MED ORDER — QUETIAPINE FUMARATE 100 MG PO TABS
100.0000 mg | ORAL_TABLET | Freq: Every day | ORAL | Status: DC
  Administered 2013-07-09 – 2013-07-12 (×4): 100 mg via ORAL
  Filled 2013-07-09: qty 1
  Filled 2013-07-09: qty 3
  Filled 2013-07-09 (×5): qty 1
  Filled 2013-07-09: qty 3

## 2013-07-09 NOTE — ED Notes (Signed)
Dr Rodman Key into see

## 2013-07-09 NOTE — Progress Notes (Signed)
[facility] Group Notes:  (Nursing/MHT/Case Management/Adjunct)  Date:  07/09/2013  Time:  10:20 PM  Type of Therapy:  Group Therapy  Participation Level:  Active  Participation Quality:  Appropriate  Affect:  Appropriate  Cognitive:  Appropriate  Insight:  Appropriate  Engagement in Group:  Engaged  Modes of Intervention:  Socialization and Support  Summary of Progress/Problems: Pt. Participated in group, and stated crying is therapeutic for stress.  Sondra Come 07/09/2013, 10:20 PM

## 2013-07-09 NOTE — ED Notes (Signed)
C/o lt sided HA, medicated per orders.

## 2013-07-09 NOTE — ED Notes (Signed)
In in the bathroom to shower and change scrubs

## 2013-07-09 NOTE — Consult Note (Signed)
[facility] Psychiatry Consult   Reason for Consult:  Having suicidal thoughts Referring Physician:  ER MD   Regina Steele is an 32 y.o. female.  Assessment: AXIS I:  Bipolar, Depressed AXIS II:  Deferred AXIS III:   Past Medical History  Diagnosis Date  . PTSD (post-traumatic stress disorder)   . Uterine prolapse 2013  . Depression   . Hiatal hernia   . Surgical menopause   . Chronic headache   . Constipation   . Esophageal reflux     no meds  . Osteopenia   . Eating disorder   . Anxiety    AXIS IV:  no real stresses she said, just started having suicidal thoughts that will not go away AXIS V:  41-50 serious symptoms  Plan:  Recommend psychiatric Inpatient admission when medically cleared.  Subjective:   Regina Steele is a 32 y.o. female patient admitted with suicidal thoughts.  HPI:   Ms Reaves says there have been no changes in her life other than changing medication from Topamax to Depakote.  Initially she was fine but started having suicidal thoughts that will not go away.  She has no desire to kill herself but is afraid to go home because of the thoughts.          HPI Elements:   Location:  ER. Quality:  relentless suicidal thoughts. Severity:  severe. Timing:  no apparent precipitants. Duration:  few days. Context:  nothing other than a change in meds.  Past Psychiatric History: Past Medical History  Diagnosis Date  . PTSD (post-traumatic stress disorder)   . Uterine prolapse 2013  . Depression   . Hiatal hernia   . Surgical menopause   . Chronic headache   . Constipation   . Esophageal reflux     no meds  . Osteopenia   . Eating disorder   . Anxiety     reports that she has never smoked. She has never used smokeless tobacco. She reports that she does not drink alcohol or use illicit drugs. Family History  Problem Relation Age of Onset  . Breast cancer Maternal Grandmother 37  . Breast cancer Paternal Aunt 40  . Celiac disease Paternal  Grandmother   . Heart disease Father   . Colon cancer Neg Hx   . Mental illness Mother   . Bipolar disorder Maternal Grandmother   . Mental illness Brother            Allergies:  No Known Allergies  Past Psychiatric History: Diagnosis:  Bipolar disorder, currently depressed  Hospitalizations:  Did not ask   Outpatient Care:  At [facility]  Substance Abuse Care:  none  Self-Mutilation:   none  Suicidal Attempts:  None none  Violent Behaviors:  none   Objective: Blood pressure 94/54, pulse 68, temperature 97.8 F (36.6 C), temperature source Oral, resp. rate 16, last menstrual period 12/14/2003, SpO2 90.00%.There is no weight on file to calculate BMI. Results for orders placed during the hospital encounter of 07/08/13 (from the past 72 hour(s))  CBC WITH DIFFERENTIAL     Status: None   Collection Time    07/08/13  7:05 PM      Result Value Range   WBC 6.7  4.0 - 10.5 K/uL   RBC 4.42  3.87 - 5.11 MIL/uL   Hemoglobin 13.3  12.0 - 15.0 g/dL   HCT 16.1  09.6 - 04.5 %   MCV 90.3  78.0 - 100.0 fL   MCH 30.1  26.0 - 34.0 pg   MCHC 33.3  30.0 - 36.0 g/dL   RDW 16.1  09.6 - 04.5 %   Platelets 229  150 - 400 K/uL   Neutrophils Relative % 55  43 - 77 %   Neutro Abs 3.7  1.7 - 7.7 K/uL   Lymphocytes Relative 33  12 - 46 %   Lymphs Abs 2.2  0.7 - 4.0 K/uL   Monocytes Relative 7  3 - 12 %   Monocytes Absolute 0.5  0.1 - 1.0 K/uL   Eosinophils Relative 4  0 - 5 %   Eosinophils Absolute 0.3  0.0 - 0.7 K/uL   Basophils Relative 1  0 - 1 %   Basophils Absolute 0.1  0.0 - 0.1 K/uL  BASIC METABOLIC PANEL     Status: Abnormal   Collection Time    07/08/13  7:05 PM      Result Value Range   Sodium 133 (*) 135 - 145 mEq/L   Potassium 4.1  3.5 - 5.1 mEq/L   Chloride 102  96 - 112 mEq/L   CO2 24  19 - 32 mEq/L   Glucose, Bld 86  70 - 99 mg/dL   BUN 13  6 - 23 mg/dL   Creatinine, Ser 4.09  0.50 - 1.10 mg/dL   Calcium 8.7  8.4 - 81.1 mg/dL   GFR calc non Af Amer >90  >90 mL/min   GFR calc  Af Amer >90  >90 mL/min   Comment: (NOTE)     The eGFR has been calculated using the CKD EPI equation.     This calculation has not been validated in all clinical situations.     eGFR's persistently <90 mL/min signify possible Chronic Kidney     Disease.  ETHANOL     Status: None   Collection Time    07/08/13  7:05 PM      Result Value Range   Alcohol, Ethyl (B) <11  0 - 11 mg/dL   Comment:            LOWEST DETECTABLE LIMIT FOR     SERUM ALCOHOL IS 11 mg/dL     FOR MEDICAL PURPOSES ONLY  URINE RAPID DRUG SCREEN (HOSP PERFORMED)     Status: None   Collection Time    07/08/13  7:24 PM      Result Value Range   Opiates NONE DETECTED  NONE DETECTED   Cocaine NONE DETECTED  NONE DETECTED   Benzodiazepines NONE DETECTED  NONE DETECTED   Amphetamines NONE DETECTED  NONE DETECTED   Tetrahydrocannabinol NONE DETECTED  NONE DETECTED   Barbiturates NONE DETECTED  NONE DETECTED   Comment:            DRUG SCREEN FOR MEDICAL PURPOSES     ONLY.  IF CONFIRMATION IS NEEDED     FOR ANY PURPOSE, NOTIFY LAB     WITHIN 5 DAYS.                LOWEST DETECTABLE LIMITS     FOR URINE DRUG SCREEN     Drug Class       Cutoff (ng/mL)     Amphetamine      1000     Barbiturate      200     Benzodiazepine   200     Tricyclics       300     Opiates  300     Cocaine          300     THC              50   Labs are reviewed and are pertinent for no pertinent psychiatric issue.  Current Facility-Administered Medications  Medication Dose Route Frequency Provider Last Rate Last Dose  . carbamazepine (TEGRETOL) tablet 200 mg  200 mg Oral TID Gilda Crease, MD   200 mg at 07/08/13 2226  . estradiol (ESTRACE) tablet 2 mg  2 mg Oral QHS Gilda Crease, MD   2 mg at 07/08/13 2227  . FLUoxetine (PROZAC) tablet 60 mg  60 mg Oral q morning - 10a Gilda Crease, MD      . HYDROcodone-acetaminophen (NORCO/VICODIN) 5-325 MG per tablet 1 tablet  1 tablet Oral Q6H PRN Gilda Crease, MD   1 tablet at 07/08/13 2134  . ibuprofen (ADVIL,MOTRIN) tablet 200 mg  200 mg Oral PRN Gilda Crease, MD      . Linaclotide Karlene Einstein) capsule 145 mcg  145 mcg Oral Daily Gilda Crease, MD      . LORazepam (ATIVAN) tablet 1 mg  1 mg Oral QID PRN Gilda Crease, MD      . minocycline (MINOCIN,DYNACIN) capsule 50 mg  50 mg Oral Daily Gilda Crease, MD      . QUEtiapine (SEROQUEL) tablet 100 mg  100 mg Oral QHS Gilda Crease, MD   100 mg at 07/08/13 2226  . risperiDONE (RISPERDAL) tablet 2 mg  2 mg Oral BID Gilda Crease, MD   2 mg at 07/08/13 2226  . topiramate (TOPAMAX) tablet 100 mg  100 mg Oral Daily Gilda Crease, MD   100 mg at 07/08/13 2226   Current Outpatient Prescriptions  Medication Sig Dispense Refill  . carbamazepine (TEGRETOL) 200 MG tablet Take 1 tablet (200 mg total) by mouth 3 (three) times daily.  90 tablet  1  . cetirizine (ZYRTEC) 10 MG tablet Take 10 mg by mouth daily.       [name] docusate sodium (COLACE) 100 MG capsule Take 200 mg by mouth 2 (two) times daily.       [name] estradiol (ESTRACE) 2 MG tablet Take 2 mg by mouth at bedtime.      . Estradiol (VAGIFEM) 10 MCG TABS Place 1 tablet vaginally 2 (two) times a week. On Mondays and Thursdays      . FLUoxetine (PROZAC) 20 MG tablet Take 3 tablets (60 mg total) by mouth every morning.  90 tablet  1  . fluticasone (FLONASE) 50 MCG/ACT nasal spray Place 2 sprays into the nose daily.      [name] HYDROcodone-acetaminophen (NORCO/VICODIN) 5-325 MG per tablet Take 1 tablet by mouth every 6 (six) hours as needed for pain.  30 tablet  0  . ibuprofen (ADVIL,MOTRIN) 200 MG tablet Take 200 mg by mouth as needed for pain.      . Linaclotide (LINZESS) 145 MCG CAPS Take 1 capsule (145 mcg total) by mouth daily.  30 capsule  11  . LORazepam (ATIVAN) 1 MG tablet Take 1 tablet (1 mg total) by mouth 4 (four) times daily as needed for anxiety.  120 tablet  0  . minocycline  (MINOCIN,DYNACIN) 50 MG capsule Take 50 mg by mouth daily. For acne      . QUEtiapine (SEROQUEL) 50 MG tablet Take 2 tablets (100 mg total) by mouth at bedtime.  60  tablet  1  . risperiDONE (RISPERDAL) 2 MG tablet Take 1 tablet (2 mg total) by mouth 2 (two) times daily.  60 tablet  1  . topiramate (TOPAMAX) 100 MG tablet TAKE 1 TABLET (100 MG TOTAL) BY MOUTH 3 (THREE) TIMES DAILY.  90 tablet  1    Psychiatric Specialty Exam:     Blood pressure 94/54, pulse 68, temperature 97.8 F (36.6 C), temperature source Oral, resp. rate 16, last menstrual period 12/14/2003, SpO2 90.00%.There is no weight on file to calculate BMI.  General Appearance: Fairly Groomed  Patent attorney::  Good  Speech:  Clear and Coherent and Normal Rate  Volume:  Normal  Mood:  Anxious  Affect:  Appropriate  Thought Process:  Negative  Orientation:  Full (Time, Place, and Person)  Thought Content:  Negative  Suicidal Thoughts:  Yes.  without intent/plan  Homicidal Thoughts:  No  Memory:  Immediate;   Good Recent;   Good Remote;   Good  Judgement:  Good  Insight:  Good  Psychomotor Activity:  Normal  Concentration:  Good  Recall:  Good  Akathisia:  Negative  Handed:  Right  AIMS (if indicated):     Assets:  Communication Skills Desire for Improvement Financial Resources/Insurance Housing Intimacy Leisure Time Physical Health Social Support Talents/Skills Transportation  Sleep:      Treatment Plan Summary: Daily contact with patient to assess and evaluate symptoms and progress in treatment Medication management recommend admission to [facility] inpatient for treatment of Bipolar depression  Rendell Thivierge D 07/09/2013 10:09 AM

## 2013-07-09 NOTE — ED Notes (Signed)
Pt ambulatory to [facility] w/ security and mHt, 2 bags of belongings, 1 security envelope sent

## 2013-07-09 NOTE — ED Notes (Signed)
Up to the bathroom 
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
## 2013-07-09 NOTE — Tx Team (Signed)
Initial Interdisciplinary Treatment Plan  PATIENT STRENGTHS: (choose at least two) Ability for insight Average or above average intelligence Capable of independent living Motivation for treatment/growth  PATIENT STRESSORS: Marital or family conflict Medication change or noncompliance   PROBLEM LIST: Problem List/Patient Goals Date to be addressed Date deferred Reason deferred Estimated date of resolution  Depression 07/09/13     Suicidal Ideation 07/09/13                                                DISCHARGE CRITERIA:  Ability to meet basic life and health needs Improved stabilization in mood, thinking, and/or behavior Verbal commitment to aftercare and medication compliance  PRELIMINARY DISCHARGE PLAN: Attend aftercare/continuing care group Return to previous living arrangement  PATIENT/FAMIILY INVOLVEMENT: This treatment plan has been presented to and reviewed with the patient, Regina Steele, and/or family member, .  The patient and family have been given the opportunity to ask questions and make suggestions.  Regina Steele, Regina Steele 07/09/2013, 6:51 PM

## 2013-07-10 ENCOUNTER — Encounter (HOSPITAL_COMMUNITY): Payer: Self-pay | Admitting: Psychiatry

## 2013-07-10 DIAGNOSIS — F411 Generalized anxiety disorder: Secondary | ICD-10-CM | POA: Diagnosis present

## 2013-07-10 DIAGNOSIS — F39 Unspecified mood [affective] disorder: Secondary | ICD-10-CM

## 2013-07-10 LAB — CARBAMAZEPINE LEVEL, TOTAL: Carbamazepine Lvl: 4.4 ug/mL (ref 4.0–12.0)

## 2013-07-10 NOTE — [facility] Group Notes (Signed)
[facility] LCSW Aftercare Discharge Planning Group Note   07/10/2013 9:43 AM  Participation Quality:  Appropriate   Mood/Affect:  Depressed  Depression Rating:  6  Anxiety Rating:  0  Thoughts of Suicide:  Yes Will you contract for safety?   Yes  Current AVH:  No  Plan for Discharge/Comments:  Pt presents with depressed mood and tearful affect. She reports being prescribed Depakote about 2 months ago and quickly deteriorating mentally. Pt reports severe depression, lack of motivation, and inability to get up during the day to take care of her children/inability to function. Pt reports that she sees Dr. Jorje Guild for med management and [name] for therapy. She is not happy with their services at this time.   Transportation Means: husband   Supports: Chief Financial Officer, Research scientist (physical sciences)
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
## 2013-07-10 NOTE — Progress Notes (Signed)
Adult Psychoeducational Group Note  Date:  07/10/2013 Time:  12:58 PM  Group Topic/Focus:  Relapse Prevention Planning:   The focus of this group is to define relapse and discuss the need for planning to combat relapse.  Participation Level:  Minimal  Participation Quality:  Resistant  Affect:  Appropriate and Resistant  Cognitive:  Appropriate  Insight: None  Engagement in Group:  Poor  Modes of Intervention:  Activity and Discussion  Additional Comments:  Pt where taught how to write SMART goals in relation to relapse and recovery.   Pt did not identify goal.   Regina Steele 07/10/2013, 12:58 PM
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
## 2013-07-10 NOTE — Clinical Social Work Note (Signed)
Pt sent to [facility] hall for programming/groups.

## 2013-07-10 NOTE — [facility] Group Notes (Signed)
[facility] LCSW Group Therapy  Feelings Around Relapse 1:15 -2:30        07/10/2013  2:38 PM   Type of Therapy:  Group Therapy  Participation Level:  Appropriate  Participation Quality:  Appropriate  Affect:  Appropriate  Cognitive:  Attentive Appropriate  Insight:  Developing/Improving  Engagement in Therapy: Developing/Improving  Modes of Intervention:  Discussion Exploration Problem-Solving Supportive  Summary of Progress/Problems:  The topic for today was feelings around relapse.    Patient processed feelings toward relapse and was able to relate to peers. She shared she would not be advocating for herself.  She advised her MD had put her on too many meds in one groups.  She shared she will spend more time talking with MD about medications.  Patient identified coping skills that can be used to prevent a relapse.   Wynn Banker 07/10/2013 2:38 PM

## 2013-07-10 NOTE — Progress Notes (Addendum)
Patient ID: Regina Steele, female   DOB: [birth year], 32 y.o.   MRN: [id number] D)  Has been pleasant, cooperative, admits to some anxiety, but denies having thoughts of self harm this evening, feeling better as the day has progressed she states.  Had a shower and then shaved axilla with observation, had labs drawn, was cooperative and has had no other c/o's .  Programming on [address]  [facility] for depression.  States feels the prozac may have been the contributing factor for her feeling suicidal, felt so depressed, but states feels better knowing that side effect and being aware.  Rates her depression/anxiety at 4/4. A)  Will continue to monitor for safety, continue POC, support R)  Safety maintained.

## 2013-07-10 NOTE — Progress Notes (Signed)
Adult Psychoeducational Group Note  Date:  07/10/2013 Time:  10:03 PM  Group Topic/Focus:  AA group  Participation Level:  Active  Participation Quality:  Appropriate  Affect:  Appropriate  Cognitive:  Alert  Insight: Appropriate  Engagement in Group:  Engaged  Modes of Intervention:  Discussion  Additional Comments:    Flonnie Hailstone 07/10/2013, 10:03 PM

## 2013-07-10 NOTE — Progress Notes (Signed)
Adult Psychoeducational Group Note  Date:  07/10/2013 Time:  12:43 PM  Group Topic/Focus:  Relapse Prevention Planning:   The focus of this group is to define relapse and discuss the need for planning to combat relapse.  Participation Level:  Active  Participation Quality:  Appropriate and Attentive  Affect:  Appropriate  Cognitive:  Alert and Appropriate  Insight: Good  Engagement in Group:  Engaged  Modes of Intervention:  Discussion, Socialization and Support  Additional Comments:  Pt came to group and shared that positive visualization of situations and exercising and doing yoga will be her go to techniques when faced with relapse.   Cathlean Cower 07/10/2013, 12:43 PM

## 2013-07-10 NOTE — Progress Notes (Signed)
Patient ID: Regina Steele, female   DOB: 06-24-1981, 32 y.o.   MRN: [id number] D: Pt. Refuses to talk to writer about reasoning for admission. "I don't want to discuss that with you". Pt. Able to verbalize medication and usage. A: Writer introduced self to client and encouraged her to to talk. Staff will monitor q40min for safety R: Pt. Is safe on the unit. Pt. Does not attend group.
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
## 2013-07-10 NOTE — Progress Notes (Signed)
D: Patient denies HI and A/V hallucinations and on and off thoughts of SI; patient reports sleep is fair; reports appetite is good; reports energy level is low ; reports ability to pay attention is poor; rates depression as 6/10; rates hopelessness 7/10;   A: Monitored q 15 minutes; patient encouraged to attend groups; patient educated about medications; patient given medications per physician orders; patient encouraged to express feelings and/or concerns  R: Patient is avoidant and isolative; patient is appropriate and cooperative; patient's interaction with staff and peers is appropriate; patient was able to set goal to talk with staff 1:1 when having feelings of SI; patient is taking medications as prescribed and tolerating medications; patient is attending all groups and engaging

## 2013-07-11 DIAGNOSIS — F332 Major depressive disorder, recurrent severe without psychotic features: Secondary | ICD-10-CM

## 2013-07-11 NOTE — Progress Notes (Signed)
Pt is awake and alert, denies HI or SI, pt is pleasant and cooperative. Interacts well with staff and others. Pt is medication complaint. Pt attended group activity.  Support and encouragement offered. Will continue to monitor for safety.

## 2013-07-11 NOTE — Progress Notes (Signed)
Adult Psychoeducational Group Note  Date:  07/11/2013 Time:  3:59 PM  Group Topic/Focus:  Healthy Communication:   The focus of this group is to discuss communication, barriers to communication, as well as healthy ways to communicate with others.  Participation Level:  Did Not Attend   Additional Comments:  Pt is programming on 500 hall. Pt did attend groups on 500.  Tora Perches N 07/11/2013, 3:59 PM

## 2013-07-11 NOTE — Progress Notes (Signed)
Adult Psychoeducational Group Note  Date:  07/11/2013 Time:  2:36 PM  Group Topic/Focus:  therapeutic activity  Participation Level:  Active  Participation Quality:  Appropriate and Attentive  Affect:  Appropriate  Cognitive:  Appropriate  Insight: Appropriate  Engagement in Group:  Engaged  Modes of Intervention:  Discussion and Education  Additional Comments:  Pt did attended and participated in group.  Shelly Bombard D 07/11/2013, 2:36 PM

## 2013-07-11 NOTE — Progress Notes (Signed)
[facility] MD Progress Note  07/11/2013 2:33 PM MAKALYNN BERWANGER  MRN:  [id number] Subjective:  Luxe reports that she is making progress. She feels less anxious since she is away from her home environment. She rates her depression as a 4 on a scale of 1-10 where 10 is the worst. She rates her anxiety as a 1 on a scale of 1-10. She endorses good sleep and appetite. She denies any suicidal or homicidal ideation. She denies any auditory or visual hallucinations. She continues to express a significant amount of worry about her home situation and her ability to survive financially if she separates from her husband.  Diagnosis:   DSM5: Schizophrenia Disorders:   Obsessive-Compulsive Disorders:   Trauma-Stressor Disorders:  Posttraumatic Stress Disorder (309.81) Substance/Addictive Disorders:   Depressive Disorders:  Major Depressive Disorder - Severe (296.23)  Axis I: Major Depression, Recurrent severe and Post Traumatic Stress Disorder Axis II: Deferred Axis III:  Past Medical History  Diagnosis Date  . PTSD (post-traumatic stress disorder)   . Uterine prolapse 2013  . Depression   . Hiatal hernia   . Surgical menopause   . Chronic headache   . Constipation   . Esophageal reflux     no meds  . Osteopenia   . Eating disorder   . Anxiety     ADL's:  Intact  Sleep: Good  Appetite:  Good  Suicidal Ideation:  Patient denies any thought, plan, or intent Homicidal Ideation:  Patient denies any thought, plan, or intent AEB (as evidenced by):  Psychiatric Specialty Exam: Review of Systems  Constitutional: Negative.   HENT: Negative.   Eyes: Negative.   Respiratory: Negative.   Cardiovascular: Negative.   Gastrointestinal: Negative.   Genitourinary: Negative.   Musculoskeletal: Negative.   Skin: Negative.   Neurological: Negative.   Psychiatric/Behavioral: Positive for depression. Negative for suicidal ideas, hallucinations and substance abuse. The patient is nervous/anxious.  The patient does not have insomnia.     Blood pressure 110/75, pulse 73, temperature 98.5 F (36.9 C), temperature source Oral, resp. rate 17, height 5\' 2"  (1.575 m), weight 57.607 kg (127 lb), last menstrual period 12/14/2003.Body mass index is 23.22 kg/(m^2).  General Appearance: Casual  Eye Contact::  Good  Speech:  Clear and Coherent  Volume:  Normal  Mood:  Anxious and Dysphoric  Affect:  Congruent  Thought Process:  Circumstantial  Orientation:  Full (Time, Place, and Person)  Thought Content:  Rumination  Suicidal Thoughts:  No  Homicidal Thoughts:  No  Memory:  Immediate;   Good Recent;   Good Remote;   Good  Judgement:  Fair  Insight:  Fair  Psychomotor Activity:  Tremor  Concentration:  Fair  Recall:  Good  Akathisia:  No  Handed:  Right  AIMS (if indicated):     Assets:  Communication Skills Desire for Improvement Housing Physical Health Resilience  Sleep:  Number of Hours: 6.75   Current Medications: Current Facility-Administered Medications  Medication Dose Route Frequency Provider Last Rate Last Dose  . acetaminophen (TYLENOL) tablet 650 mg  650 mg Oral Q6H PRN Shuvon Rankin, NP      . alum & mag hydroxide-simeth (MAALOX/MYLANTA) 200-200-20 MG/5ML suspension 30 mL  30 mL Oral Q4H PRN Shuvon Rankin, NP      . carbamazepine (TEGRETOL) tablet 200 mg  200 mg Oral TID Shuvon Rankin, NP   200 mg at 07/11/13 1138  . estradiol (ESTRACE) tablet 2 mg  2 mg Oral QHS Shuvon Rankin, NP  2 mg at 07/10/13 2128  . FLUoxetine (PROZAC) tablet 60 mg  60 mg Oral q morning - 10a Shuvon Rankin, NP   60 mg at 07/11/13 1018  . Linaclotide (LINZESS) capsule 145 mcg  145 mcg Oral Daily Shuvon Rankin, NP   145 mcg at 07/11/13 0806  . LORazepam (ATIVAN) tablet 1 mg  1 mg Oral TID PRN Shuvon Rankin, NP      . magnesium hydroxide (MILK OF MAGNESIA) suspension 30 mL  30 mL Oral Daily PRN Shuvon Rankin, NP      . minocycline (MINOCIN,DYNACIN) capsule 50 mg  50 mg Oral Daily Shuvon Rankin,  NP   50 mg at 07/11/13 0806  . QUEtiapine (SEROQUEL) tablet 100 mg  100 mg Oral QHS Shuvon Rankin, NP   100 mg at 07/10/13 2129  . risperiDONE (RISPERDAL) tablet 2 mg  2 mg Oral BID Shuvon Rankin, NP   2 mg at 07/11/13 0806  . topiramate (TOPAMAX) tablet 100 mg  100 mg Oral Custom Shuvon Rankin, NP   100 mg at 07/11/13 1138    Lab Results:  Results for orders placed during the hospital encounter of 07/09/13 (from the past 48 hour(s))  CARBAMAZEPINE LEVEL, TOTAL     Status: None   Collection Time    07/10/13  8:07 PM      Result Value Range   Carbamazepine Lvl 4.4  4.0 - 12.0 ug/mL   Comment: Performed at [facility]    Physical Findings: AIMS: Facial and Oral Movements Muscles of Facial Expression: None, normal Lips and Perioral Area: None, normal Jaw: None, normal Tongue: None, normal,Extremity Movements Upper (arms, wrists, hands, fingers): None, normal Lower (legs, knees, ankles, toes): None, normal, Trunk Movements Neck, shoulders, hips: None, normal, Overall Severity Severity of abnormal movements (highest score from questions above): None, normal Incapacitation due to abnormal movements: None, normal Patient's awareness of abnormal movements (rate only patient's report): No Awareness, Dental Status Current problems with teeth and/or dentures?: No Does patient usually wear dentures?: No  CIWA:    COWS:     Treatment Plan Summary: Daily contact with patient to assess and evaluate symptoms and progress in treatment Medication management  Plan: We will appreciate the Tegretol level and make appropriate ejects metastases to her Tegretol dose. Otherwise we will continue the current plan of care.  Medical Decision Making Problem Points:  Established problem, stable/improving (1) and Review of psycho-social stressors (1) Data Points:  Review or order clinical lab tests (1) Review of medication regiment & side effects (2)  I certify that inpatient services furnished  can reasonably be expected to improve the patient's condition.   Fern Asmar 07/11/2013, 2:33 PM

## 2013-07-11 NOTE — [facility] Group Notes (Signed)
[facility] Group Notes: (Clinical Social Work)   07/11/2013      Type of Therapy:  Group Therapy   Participation Level:  Did Not Attend    Chanah Tidmore Grossman-Orr, LCSW 07/11/2013, 12:48 PM     
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
## 2013-07-12 DIAGNOSIS — F331 Major depressive disorder, recurrent, moderate: Secondary | ICD-10-CM

## 2013-07-12 MED ORDER — LORATADINE 10 MG PO TABS
10.0000 mg | ORAL_TABLET | Freq: Every day | ORAL | Status: DC
  Administered 2013-07-12 – 2013-07-13 (×2): 10 mg via ORAL
  Filled 2013-07-12 (×2): qty 1
  Filled 2013-07-12: qty 3
  Filled 2013-07-12 (×2): qty 1

## 2013-07-12 MED ORDER — LINACLOTIDE 145 MCG PO CAPS
145.0000 ug | ORAL_CAPSULE | Freq: Every day | ORAL | Status: DC
  Administered 2013-07-13: 145 ug via ORAL
  Filled 2013-07-12 (×2): qty 1
  Filled 2013-07-12: qty 3

## 2013-07-12 MED ORDER — CARBAMAZEPINE 200 MG PO TABS
200.0000 mg | ORAL_TABLET | Freq: Four times a day (QID) | ORAL | Status: DC
  Administered 2013-07-12 – 2013-07-13 (×5): 200 mg via ORAL
  Filled 2013-07-12 (×2): qty 1
  Filled 2013-07-12: qty 12
  Filled 2013-07-12 (×4): qty 1
  Filled 2013-07-12: qty 12
  Filled 2013-07-12: qty 1
  Filled 2013-07-12: qty 12
  Filled 2013-07-12: qty 1
  Filled 2013-07-12: qty 12

## 2013-07-12 NOTE — Progress Notes (Signed)
Patient ID: Regina Steele, female   DOB: 04/27/1981, 32 y.o.   MRN: [id number] Psychoeducational Group Note  Date:  07/12/2013 Time:  1045am  Group Topic/Focus:  Making Healthy Choices:   The focus of this group is to help patients identify negative/unhealthy choices they were using prior to admission and identify positive/healthier coping strategies to replace them upon discharge.  Participation Level:  Active  Participation Quality:  Appropriate  Affect:  Appropriate  Cognitive:  Appropriate  Insight:  Supportive  Engagement in Group:  Supportive  Additional Comments:  Psychoeducational group-life skills   Valente David 07/12/2013,12:11 PM

## 2013-07-12 NOTE — Progress Notes (Signed)
Patient ID: MITSUYE SCHRODT, female   DOB: July 09, 1981, 32 y.o.   MRN: [id number] Psychoeducational Group Note  Date:  07/12/2013 Time:  0930am  Group Topic/Focus:  Making Healthy Choices:   The focus of this group is to help patients identify negative/unhealthy choices they were using prior to admission and identify positive/healthier coping strategies to replace them upon discharge.  Participation Level:  Active  Participation Quality:  Appropriate  Affect:  Appropriate  Cognitive:  Appropriate  Insight:  Supportive  Engagement in Group:  Supportive  Additional Comments:  Psychoeducational group - greatfulness  Valente David 07/12/2013,10:14 AM

## 2013-07-12 NOTE — [facility] Group Notes (Signed)
[facility] Group Notes: (Clinical Social Work)   07/12/2013      Type of Therapy:  Group Therapy   Participation Level:  Did Not Attend (is programming on 500 hall)   Ambrose Mantle, LCSW 07/12/2013, 5:09 PM

## 2013-07-12 NOTE — Progress Notes (Signed)
D   Pt is pleasant and appropriate   She attends and participates in groups   She complained of some staff being rude to her and requested to be told a way to make her complaint known  Pt also complained of sinus congestion and poor sleep A   Verbal support given  Medications administered and effectiveness monitored Q 15 min checks  Provided pt with paper and pencil to write complaints down and will forward to the appropriate person R   Pt safe at present

## 2013-07-12 NOTE — [facility] Group Notes (Signed)
Adult Psychoeducational Group Note  Date:  07/12/2013 Time:  10:00 PM  Group Topic/Focus:  Wrap-Up Group:   The focus of this group is to help patients review their daily goal of treatment and discuss progress on daily workbooks.  Participation Level:  Minimal  Participation Quality:  Appropriate  Affect:  Appropriate  Cognitive:  Appropriate  Insight: Appropriate  Engagement in Group:  Limited  Modes of Intervention:  Discussion  Additional Comments:  Shelena's goal was to stay low on the depression scale.  She was able to accomplish this goal by thinking positive thoughts and talking to her doctor.  Caroll Rancher A 07/12/2013, 10:00 PM

## 2013-07-12 NOTE — [facility] Group Notes (Signed)
[facility] Group Notes: (Clinical Social Work)   07/12/2013      Type of Therapy:  Group Therapy   Participation Level:  Did Not Attend (programming on 500)   Ambrose Mantle, LCSW 07/12/2013, 1:04 PM

## 2013-07-12 NOTE — Progress Notes (Signed)
Patient ID: Regina Steele, female   DOB: [birth year], 32 y.o.   MRN: [id number] D)  Has spent most of the evening on the 500 hall, programming there for depression.  States feels better this evening, has enjoyed the life skills classes today.  Remains rather quiet but pleasant, compliant with meds and program.  Denies thoughts of self harm. A)  Will continue to monitor for safety, continue POC R)  Safety maintained.

## 2013-07-12 NOTE — Progress Notes (Signed)
[facility] MD Progress Note  07/12/2013 11:57 AM Regina Steele  MRN:  [id number] Subjective:  Rolinda reports that she feels she is making good progress. She is sleeping better because she is less anxious away from home and not worrying about problems there. She also endorses that her depression has diminished, and rates her depression currently as a 1 on a scale of 1-10 where 10 is the worst. She does endorse some problems with concentration, and was having difficulty reading yesterday. She endorses a good appetite. She denies any suicidal or homicidal ideation. She denies any auditory or visual hallucinations. She asked that her Zyrtec be reinstated as she has not been getting that here. She also asked about her Tegretol level, and that her Linzess be given to her at 6:30 in the morning.  Diagnosis:   DSM5: Schizophrenia Disorders:   Obsessive-Compulsive Disorders:   Trauma-Stressor Disorders:  Posttraumatic Stress Disorder (309.81) Substance/Addictive Disorders:   Depressive Disorders:  Major Depressive Disorder - Moderate (296.22)  Axis I: Major Depression, Recurrent moderate and Post Traumatic Stress Disorder Axis II: Deferred Axis III:  Past Medical History  Diagnosis Date  . PTSD (post-traumatic stress disorder)   . Uterine prolapse 2013  . Depression   . Hiatal hernia   . Surgical menopause   . Chronic headache   . Constipation   . Esophageal reflux     no meds  . Osteopenia   . Eating disorder   . Anxiety     ADL's:  Intact  Sleep: Good  Appetite:  Good  Suicidal Ideation:  Patient denies any thought, plan, or intent Homicidal Ideation:  Patient denies any thought, plan, or intent AEB (as evidenced by):  Psychiatric Specialty Exam: Review of Systems  Constitutional: Negative.   HENT: Negative.   Eyes: Negative.   Respiratory: Negative.   Cardiovascular: Negative.   Gastrointestinal: Negative.   Genitourinary: Negative.   Musculoskeletal: Negative.   Skin:  Negative.   Neurological: Negative.   Endo/Heme/Allergies: Negative.   Psychiatric/Behavioral: The patient is nervous/anxious.     Blood pressure 117/73, pulse 76, temperature 98.2 F (36.8 C), temperature source Oral, resp. rate 14, height 5\' 2"  (1.575 m), weight 57.607 kg (127 lb), last menstrual period 12/14/2003.Body mass index is 23.22 kg/(m^2).  General Appearance: Casual  Eye Contact::  Fair  Speech:  Clear and Coherent  Volume:  Normal  Mood:  Anxious  Affect:  Congruent  Thought Process:  Goal Directed  Orientation:  Full (Time, Place, and Person)  Thought Content:  WDL  Suicidal Thoughts:  No  Homicidal Thoughts:  No  Memory:  Immediate;   Fair Recent;   Fair Remote;   Fair  Judgement:  Fair  Insight:  Fair  Psychomotor Activity:  Normal  Concentration:  Good  Recall:  Good  Akathisia:  No  Handed:  Right  AIMS (if indicated):     Assets:  Communication Skills Desire for Improvement Resilience  Sleep:  Number of Hours: 5.25   Current Medications: Current Facility-Administered Medications  Medication Dose Route Frequency Provider Last Rate Last Dose  . acetaminophen (TYLENOL) tablet 650 mg  650 mg Oral Q6H PRN Shuvon Rankin, NP      . alum & mag hydroxide-simeth (MAALOX/MYLANTA) 200-200-20 MG/5ML suspension 30 mL  30 mL Oral Q4H PRN Shuvon Rankin, NP      . carbamazepine (TEGRETOL) tablet 200 mg  200 mg Oral QID Jorje Guild, PA-C      . estradiol (ESTRACE) tablet 2 mg  2 mg Oral QHS Shuvon Rankin, NP   2 mg at 07/11/13 2119  . FLUoxetine (PROZAC) tablet 60 mg  60 mg Oral q morning - 10a Shuvon Rankin, NP   60 mg at 07/12/13 0836  . [START ON 07/13/2013] Linaclotide (LINZESS) capsule 145 mcg  145 mcg Oral Daily Jorje Guild, PA-C      . loratadine (CLARITIN) tablet 10 mg  10 mg Oral Daily Jorje Guild, PA-C      . LORazepam (ATIVAN) tablet 1 mg  1 mg Oral TID PRN Shuvon Rankin, NP      . magnesium hydroxide (MILK OF MAGNESIA) suspension 30 mL  30 mL Oral Daily PRN Shuvon  Rankin, NP      . minocycline (MINOCIN,DYNACIN) capsule 50 mg  50 mg Oral Daily Shuvon Rankin, NP   50 mg at 07/12/13 0837  . QUEtiapine (SEROQUEL) tablet 100 mg  100 mg Oral QHS Shuvon Rankin, NP   100 mg at 07/11/13 2119  . risperiDONE (RISPERDAL) tablet 2 mg  2 mg Oral BID Shuvon Rankin, NP   2 mg at 07/12/13 0836  . topiramate (TOPAMAX) tablet 100 mg  100 mg Oral Custom Shuvon Rankin, NP   100 mg at 07/12/13 9562    Lab Results:  Results for orders placed during the hospital encounter of 07/09/13 (from the past 48 hour(s))  CARBAMAZEPINE LEVEL, TOTAL     Status: None   Collection Time    07/10/13  8:07 PM      Result Value Range   Carbamazepine Lvl 4.4  4.0 - 12.0 ug/mL   Comment: Performed at [facility]    Physical Findings: AIMS: Facial and Oral Movements Muscles of Facial Expression: None, normal Lips and Perioral Area: None, normal Jaw: None, normal Tongue: None, normal,Extremity Movements Upper (arms, wrists, hands, fingers): None, normal Lower (legs, knees, ankles, toes): None, normal, Trunk Movements Neck, shoulders, hips: None, normal, Overall Severity Severity of abnormal movements (highest score from questions above): None, normal Incapacitation due to abnormal movements: None, normal Patient's awareness of abnormal movements (rate only patient's report): No Awareness, Dental Status Current problems with teeth and/or dentures?: No Does patient usually wear dentures?: No  CIWA:    COWS:     Treatment Plan Summary: Daily contact with patient to assess and evaluate symptoms and progress in treatment Medication management  Plan: We will reinstate her 0 tach per her request. As her Tegretol level was at the low end of the normal range, we will increase it slightly to 200 mg 4 times daily. Otherwise we will continue her current plan of care.  Medical Decision Making Problem Points:  Established problem, stable/improving (1) and Review of psycho-social  stressors (1) Data Points:  Review or order clinical lab tests (1) Review of medication regiment & side effects (2) Review of new medications or change in dosage (2)  I certify that inpatient services furnished can reasonably be expected to improve the patient's condition.   Elisabeth Strom 07/12/2013, 11:57 AM

## 2013-07-13 DIAGNOSIS — F4310 Post-traumatic stress disorder, unspecified: Secondary | ICD-10-CM

## 2013-07-13 DIAGNOSIS — F319 Bipolar disorder, unspecified: Principal | ICD-10-CM

## 2013-07-13 MED ORDER — LORAZEPAM 1 MG PO TABS
1.0000 mg | ORAL_TABLET | Freq: Three times a day (TID) | ORAL | Status: DC | PRN
Start: 2013-07-13 — End: 2013-08-19

## 2013-07-13 MED ORDER — ESTRADIOL 2 MG PO TABS: 2.0000 mg | ORAL_TABLET | Freq: Every day | ORAL | Status: DC

## 2013-07-13 MED ORDER — CETIRIZINE HCL 10 MG PO TABS: 10.0000 mg | ORAL_TABLET | Freq: Every day | ORAL | Status: DC

## 2013-07-13 MED ORDER — QUETIAPINE FUMARATE 100 MG PO TABS: 100.0000 mg | ORAL_TABLET | Freq: Every day | ORAL | Status: DC

## 2013-07-13 MED ORDER — LINACLOTIDE 145 MCG PO CAPS: 145.0000 ug | ORAL_CAPSULE | Freq: Every day | ORAL | Status: DC

## 2013-07-13 MED ORDER — MINOCYCLINE HCL 50 MG PO CAPS: 50.0000 mg | ORAL_CAPSULE | Freq: Every day | ORAL | Status: DC

## 2013-07-13 MED ORDER — ESTRADIOL 10 MCG VA TABS: 1.0000 | ORAL_TABLET | VAGINAL | Status: DC

## 2013-07-13 MED ORDER — DOCUSATE SODIUM 100 MG PO CAPS: 200.0000 mg | ORAL_CAPSULE | Freq: Two times a day (BID) | ORAL | Status: DC

## 2013-07-13 MED ORDER — CARBAMAZEPINE 200 MG PO TABS: 200.0000 mg | ORAL_TABLET | Freq: Four times a day (QID) | ORAL | Status: DC

## 2013-07-13 MED ORDER — FLUOXETINE HCL 60 MG PO TABS: 60.0000 mg | ORAL_TABLET | Freq: Every morning | ORAL | Status: DC

## 2013-07-13 MED ORDER — RISPERIDONE 2 MG PO TABS: 2.0000 mg | ORAL_TABLET | Freq: Two times a day (BID) | ORAL | Status: DC

## 2013-07-13 MED ORDER — TOPIRAMATE 100 MG PO TABS: 100.0000 mg | ORAL_TABLET | Freq: Three times a day (TID) | ORAL | Status: DC

## 2013-07-13 MED ORDER — FLUTICASONE PROPIONATE 50 MCG/ACT NA SUSP: 2.0000 | Freq: Every day | NASAL | Status: DC

## 2013-07-13 MED ORDER — IBUPROFEN 200 MG PO TABS: 200.0000 mg | ORAL_TABLET | ORAL | Status: DC | PRN

## 2013-07-13 NOTE — [facility] Suicide Risk Assessment (Signed)
Suicide Risk Assessment  Discharge Assessment     Demographic Factors:  Caucasian  Mental Status Per Nursing Assessment::   On Admission:  Self-harm thoughts;NA  Current Mental Status by Physician: In full contact with reality. There are no suicidal ideas, plans or intent. Her mood is anxious, affect is appropriate. She feels much better. She feels clear headed,. Denies any suicidal ideas plans or intent. States she does not want to get back to where she was with the severe depression. States that she feels the Depakote was responsible for it. She is comfortable with the medications she is taking now. Her Tegretol was increased. She states that she wants to be feeling well to be able to activate her plan of leaving her husband. The relationship she states is very conflictive. She knows it is not going to get any better   Loss Factors: Loss of significant relationship  Historical Factors: NA  Risk Reduction Factors:   Responsible for children under 39 years of age, Sense of responsibility to family and Living with another person, especially a relative  Continued Clinical Symptoms:  Bipolar Disorder:   Depressive phase  Cognitive Features That Contribute To Risk: None identifed   Suicide Risk:  Minimal: No identifiable suicidal ideation.  Patients presenting with no risk factors but with morbid ruminations; may be classified as minimal risk based on the severity of the depressive symptoms  Discharge Diagnoses:   AXIS I:  Anxiety Disorder NOS, Bipolar, Depressed and Post Traumatic Stress Disorder AXIS II:  Deferred AXIS III:   Past Medical History  Diagnosis Date  . PTSD (post-traumatic stress disorder)   . Uterine prolapse 2013  . Depression   . Hiatal hernia   . Surgical menopause   . Chronic headache   . Constipation   . Esophageal reflux     no meds  . Osteopenia   . Eating disorder   . Anxiety    AXIS IV:  problems with primary support group AXIS V:  61-70 mild  symptoms  Plan Of Care/Follow-up recommendations:  Activity:  as tolerated Diet:  regular Follow up at the [facility] outpatient department Is patient on multiple antipsychotic therapies at discharge:  No   Has Patient had three or more failed trials of antipsychotic monotherapy by history:  No  Recommended Plan for Multiple Antipsychotic Therapies: NA  Regina Steele A 07/13/2013, 1:27 PM

## 2013-07-13 NOTE — Discharge Summary (Signed)
Physician Discharge Summary Note  Patient:  Regina Steele is an 32 y.o., female MRN:  [id number] DOB:  09/29/1981 Patient phone:  [phone] (home)  Patient address:   [address] [place] [address],   Date of Admission:  07/09/2013 Date of Discharge: 07/13/13  Reason for Admission:  Increased depression  Discharge Diagnoses: Active Problems:   PTSD (post-traumatic stress disorder)   Bipolar disorder, unspecified   Anxiety state, unspecified  Review of Systems  Constitutional: Negative.   HENT: Negative.   Eyes: Negative.   Respiratory: Negative.   Cardiovascular: Negative.   Gastrointestinal: Negative.   Genitourinary: Negative.   Musculoskeletal: Negative.   Skin: Negative.   Neurological: Negative.   Endo/Heme/Allergies: Negative.   Psychiatric/Behavioral: Positive for depression (Stabilized with medication prior to discharge). Negative for suicidal ideas, hallucinations, memory loss and substance abuse. The patient is nervous/anxious (Satbilized with medication prior to discharge) and has insomnia (Stabilized with medication prior to discharge).     DSM5:  Schizophrenia Disorders:  NA Obsessive-Compulsive Disorders:  NA Trauma-Stressor Disorders:  Posttraumatic Stress Disorder (309.81) ,Generalized anxiety disorder Substance/Addictive Disorders:  NA Depressive Disorders:  Bipolar affective disorder,   Axis Diagnosis:   AXIS I:  Post Traumatic Stress Disorder and  Bipolar affective disorder AXIS II:  Deferred AXIS III:   Past Medical History  Diagnosis Date  . PTSD (post-traumatic stress disorder)   . Uterine prolapse 2013  . Depression   . Hiatal hernia   . Surgical menopause   . Chronic headache   . Constipation   . Esophageal reflux     no meds  . Osteopenia   . Eating disorder   . Anxiety    AXIS IV:  other psychosocial or environmental problems, Chronic mental health issues AXIS V:  63  Level of Care:  OP  Hospital Course:  32  Y/O female who states she was diagnosed with depression when she was 32 Y/O in college. States she also has PTSD, States that more recently she was placed on Depokote as she was having racing thoughts, and erratic sleep. States shortly after she took it her mood got really depressed. She started sleeping a lot 12 hours at night and one or two hours naps during the day. During the day no energy, no motivation, fatigue. She was taking 1000 mg and it was decrease to half. sthe was still feeling the same way. Last week started sleeping 14 ours at night then nap for 2 hours during the day. Started feeling suicidal, hopeless. She was not able to exercise what she uses as a way to cope. She was having plans of killing herself..She decided to come to the hospital.  While a patient in this hospital and upon admission assessment/evaluation, it was determined based on patient's symptoms that her toxicology/UDS reports indicated no presence of alcohol and or any other drugs in her systems. Although feeling depressed, suicidal and upset, patient was not presenting any withdrawal symptoms of alcohol and or any other substances. As a result, she received no detoxification treatment protocol. However, it was determined that she will need medication management to re-stabilize her current depressive mood symptoms as she has a history of bipolar disorder and PTSD. She was then ordered and received Rispaerdal 2 mg bid for mood control, Seroquel 100 mg Q bedtime for mood control, Fluoxetine 60 mg daily for depression, Lorazepam 1 mg tid as needed for anxiety, Topamax 100 mg tid for mood stabilization, and Tegretol 200 mg qid for mood  stabilization . She also was enrolled in group counseling sessions and activities where she was counseled and learned coping skills that should help her cope better and manage her symptoms effectively after discharge. She also received medication management and monitoring for her other previously existing  medical issues and concerns that she presented. She tolerated her treatment regimen without any significant adverse effects and or reactions presented.   Patient did respond positively to her treatment regimen. This is evidenced by her daily reports of improved mood, reduction of symptoms and presentation of good affect/eye contact. She attended treatment team meeting this am and met with her treatment team members. Her reason for admission, present symptoms, response to treatment and discharge plans discussed with patient. Ms. Murphey endorsed that her symptoms has stabilized and that she is ready for discharge to pursue psychiatric care on outpatient basis. It was then agreed upon that patient will follow-up care at the [facility] behavioral hospital outpatient clinic with [name] and Jorje Guild, PA. Jamel has been instructed to call the [facility] outpatient clinic on Tuesday morning to schedule a follow-up appointment. The [facility] outpatient clinic was closed today due the Labor day holiday.The address and contact information for this clinic provided for patient in writing.  Upon discharge, Ms. Dupee adamantly denies any suicidal, homicidal ideations, auditory, visual hallucinations, paranoia and or delusional thoughts. She was provided with 14 days worth supply samples of her [facility] discharge medications. She left [facility] with all personal belongings in no apparent distress. Transportation per husband.  Consults:  psychiatry  Significant Diagnostic Studies:  labs: XCBC with diff, CMP, UDS, Toxicology tests, U/A  Discharge Vitals:   Blood pressure 112/76, pulse 76, temperature 98.8 F (37.1 C), temperature source Oral, resp. rate 16, height 5\' 2"  (1.575 m), weight 57.607 kg (127 lb), last menstrual period 12/14/2003. Body mass index is 23.22 kg/(m^2). Lab Results:   No results found for this or any previous visit (from the past 72 hour(s)).  Physical Findings: AIMS: Facial and Oral Movements Muscles of  Facial Expression: None, normal Lips and Perioral Area: None, normal Jaw: None, normal Tongue: None, normal,Extremity Movements Upper (arms, wrists, hands, fingers): None, normal Lower (legs, knees, ankles, toes): None, normal, Trunk Movements Neck, shoulders, hips: None, normal, Overall Severity Severity of abnormal movements (highest score from questions above): None, normal Incapacitation due to abnormal movements: None, normal Patient's awareness of abnormal movements (rate only patient's report): No Awareness, Dental Status Current problems with teeth and/or dentures?: No Does patient usually wear dentures?: No  CIWA:    COWS:     Psychiatric Specialty Exam: See Psychiatric Specialty Exam and Suicide Risk Assessment completed by Attending Physician prior to discharge.  Discharge destination:  Home  Is patient on multiple antipsychotic therapies at discharge:  Yes   Has Patient had three or more failed trials of antipsychotic monotherapy by history:  Abilify, Seroquel, Haldol,   Recommended Plan for Multiple Antipsychotic Therapies: And because patient has not been able to achieve symptoms control under antipsychotic monotherapy, she is currently receiving 2 separate antipsychotic medications Rispaerdal and Seroquel. It will benefit patient to continue this combination therapy as recommended. However, as symptoms continue to improve, patient may be titrated to a monotherapy to the discretion and proper judgement of her outpatient provider.       Future Appointments Provider Department Dept Phone   07/23/2013 2:30 PM Carman Ching, [place] [facility] [place] [phone]   07/30/2013 1:30 PM Carman Ching, LCSW BEHAVIORAL  HEALTH OUTPATIENT THERAPY [place] [phone]   08/06/2013 2:30 PM Carman Ching, LCSW BEHAVIORAL HEALTH OUTPATIENT THERAPY [place] [phone]   08/13/2013 3:30 PM Carman Ching, LCSW BEHAVIORAL HEALTH  OUTPATIENT THERAPY [place] [phone]   08/20/2013 2:30 PM Carman Ching, LCSW BEHAVIORAL HEALTH OUTPATIENT THERAPY [place] [phone]       Medication List    STOP taking these medications       HYDROcodone-acetaminophen 5-325 MG per tablet  Commonly known as:  NORCO/VICODIN      TAKE these medications     Indication   carbamazepine 200 MG tablet  Commonly known as:  TEGRETOL  Take 1 tablet (200 mg total) by mouth 4 (four) times daily. Mood stabilization   Indication:  Mood stabilization     cetirizine 10 MG tablet  Commonly known as:  ZYRTEC  Take 1 tablet (10 mg total) by mouth daily. For allergies   Indication:  Perennial Rhinitis, Hayfever     docusate sodium 100 MG capsule  Commonly known as:  COLACE  Take 2 capsules (200 mg total) by mouth 2 (two) times daily. For constipation   Indication:  Constipation     Estradiol 10 MCG Tabs vaginal tablet  Commonly known as:  VAGIFEM  Place 1 tablet (10 mcg total) vaginally 2 (two) times a week. On Mondays and Thursdays: For Vaginal atrophy   Indication:  Atrophic Vaginitis     estradiol 2 MG tablet  Commonly known as:  ESTRACE  Take 1 tablet (2 mg total) by mouth at bedtime. For hormone replacement (Low estrogen)   Indication:  Deficiency of the Hormone Estrogen     FLUoxetine HCl 60 MG Tabs  Take 60 mg by mouth every morning. For depression   Indication:  Major Depressive Disorder     fluticasone 50 MCG/ACT nasal spray  Commonly known as:  FLONASE  Place 2 sprays into the nose daily. For allergies   Indication:  Perennial Rhinitis, Hayfever     ibuprofen 200 MG tablet  Commonly known as:  ADVIL,MOTRIN  Take 1 tablet (200 mg total) by mouth as needed for pain.   Indication:  Mild to Moderate Pain     Linaclotide 145 MCG Caps capsule  Commonly known as:  LINZESS  Take 1 capsule (145 mcg total) by mouth daily.   Indication:  Chronic Constipation of Unknown Cause, Constipation caused by Irritable  Bowel Syndrome     LORazepam 1 MG tablet  Commonly known as:  ATIVAN  Take 1 tablet (1 mg total) by mouth 3 (three) times daily as needed for anxiety. For anxiety   Indication:  Feeling Anxious     minocycline 50 MG capsule  Commonly known as:  MINOCIN,DYNACIN  Take 1 capsule (50 mg total) by mouth daily. For gastritis   Indication:  Gastroenteritis     QUEtiapine 100 MG tablet  Commonly known as:  SEROQUEL  Take 1 tablet (100 mg total) by mouth at bedtime. For mood control   Indication:  Mood control     risperiDONE 2 MG tablet  Commonly known as:  RISPERDAL  Take 1 tablet (2 mg total) by mouth 2 (two) times daily. For mood control   Indication:  Mood control     topiramate 100 MG tablet  Commonly known as:  TOPAMAX  Take 1 tablet (100 mg total) by mouth 3 (three) times daily. For mood stabilization   Indication:  Mood stabilization       Follow-up Information   Follow up  with [facility] . (Call Office Tuesday morning to schedule hospital followup appt with [name] for medication management and Candace Gallus. for therapy. Be sure to inform them that you would like to transfer services from Jorje Guild to [name].)    Contact information:   [address] [place], [place] [address] Phone: [phone]     Follow-up recommendations: Activity:  As tolerated Diet: As recommended by your primary care doctor. Keep all scheduled follow-up appointments as recommended.   Comments:  Take all your medications as prescribed by your mental healthcare provider. Report any adverse effects and or reactions from your medicines to your outpatient provider promptly. Patient is instructed and cautioned to not engage in alcohol and or illegal drug use while on prescription medicines. In the event of worsening symptoms, patient is instructed to call the crisis hotline, 911 and or go to the nearest ED for appropriate evaluation and treatment of symptoms. Follow-up with your primary care  provider for your other medical issues, concerns and or health care needs.  Continue to work on the life style changes/mindfulness techniques that will better help you  manage your mood/anxiety disorder Total Discharge Time:  Greater than 30 minutes.  Signed: Sanjuana Kava, PMHNP-BC 07/14/2013, 11:50 AM Agree with assessment and plan Reymundo Poll. Dub Mikes, M.D.

## 2013-07-13 NOTE — Progress Notes (Signed)
Patient ID: Regina Steele, female   DOB: 02-13-1981, 32 y.o.   MRN: [id number] She has been discharged home and was picked up by her husband.  She voiced understanding of discharge teaching  About medications and follow up plan. She denies thought of SI and all her belongings were taken home with her.

## 2013-07-13 NOTE — [facility] Suicide Risk Assessment (Signed)
[facility] INPATIENT:  Family/Significant Other Suicide Prevention Education  Suicide Prevention Education:  Education Completed; Mickle Mallory (pt's husband) has been identified by the patient as the family member/significant other with whom the patient will be residing, and identified as the person(s) who will aid the patient in the event of a mental health crisis (suicidal ideations/suicide attempt).  With written consent from the patient, the family member/significant other has been provided the following suicide prevention education, prior to the and/or following the discharge of the patient.  The suicide prevention education provided includes the following:  Suicide risk factors  Suicide prevention and interventions  National Suicide Hotline telephone number  [facility] assessment telephone number  [facility] Emergency Assistance 911  [facility] and/or Residential Mobile Crisis Unit telephone number  Request made of family/significant other to:  Remove weapons (e.g., guns, rifles, knives), all items previously/currently identified as safety concern.    Remove drugs/medications (over-the-counter, prescriptions, illicit drugs), all items previously/currently identified as a safety concern.  The family member/significant other verbalizes understanding of the suicide prevention education information provided.  The family member/significant other agrees to remove the items of safety concern listed above.  Regina Steele, Regina Steele 07/13/2013, 10:40 AM

## 2013-07-13 NOTE — Progress Notes (Signed)
[facility] Adult Case Management Discharge Plan :  Will you be returning to the same living situation after discharge: Yes,  home with husband At discharge, do you have transportation home?:Yes,  husband coming at 1:30PM to transport pt home. Do you have the ability to pay for your medications:Yes,  medicare  Release of information consent forms completed and in the chart;  Patient's signature needed at discharge.  Patient to Follow up at: Follow-up Information   Follow up with [facility] . (Call Office Tuesday morning to schedule hospital followup appt with [name] for medication management and Victorino Dike for therapy. Be sure to inform them that you would like to transfer services from Jorje Guild to [name].)    Contact information:   [address] [place], [place] [address] Phone: [phone]      Patient denies SI/HI:   Yes,  during group/self report.    Safety Planning and Suicide Prevention discussed:  Yes,  SPE completed with pt's husband. SPI pamphlet given to pt and she was encouraged to ask question and talk about concerns.   Steele, Regina Caris 07/13/2013, 10:42 AM

## 2013-07-13 NOTE — Progress Notes (Signed)
Pt resting in bed with eyes closed. Breathing even and unlabored. Q15 min checks maintained for safety. Pt remains safe on the unit.   

## 2013-07-15 ENCOUNTER — Ambulatory Visit (INDEPENDENT_AMBULATORY_CARE_PROVIDER_SITE_OTHER): Payer: Federal, State, Local not specified - Other | Admitting: Physician Assistant

## 2013-07-15 VITALS — BP 127/94 | HR 95 | Ht 62.0 in | Wt 123.0 lb

## 2013-07-15 DIAGNOSIS — F319 Bipolar disorder, unspecified: Secondary | ICD-10-CM | POA: Diagnosis not present

## 2013-07-15 DIAGNOSIS — F4310 Post-traumatic stress disorder, unspecified: Secondary | ICD-10-CM | POA: Diagnosis not present

## 2013-07-16 NOTE — Progress Notes (Signed)
Patient Discharge Instructions:  Next Level Care Provider Has Access to the EMR, 07/16/13 Records provided to [facility] via [facility]/Epic access.  Jerelene Redden, 07/16/2013, 2:47 PM

## 2013-07-17 ENCOUNTER — Ambulatory Visit (HOSPITAL_COMMUNITY): Payer: Self-pay | Admitting: Psychiatry

## 2013-07-20 ENCOUNTER — Telehealth (HOSPITAL_COMMUNITY): Payer: Self-pay

## 2013-07-20 ENCOUNTER — Encounter (HOSPITAL_COMMUNITY): Payer: Self-pay | Admitting: Physician Assistant

## 2013-07-20 ENCOUNTER — Other Ambulatory Visit (HOSPITAL_COMMUNITY): Payer: Self-pay | Admitting: Psychiatry
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
## 2013-07-20 NOTE — Telephone Encounter (Signed)
See note

## 2013-07-20 NOTE — Telephone Encounter (Signed)
Per [name]: Advised pt if felt in any danger of harming self or others to go to ED or call 911.Pt states right now, just  sleeping a lot.Denies feeling suicidal.States she will discuss with her husband when he gets home.

## 2013-07-23 ENCOUNTER — Ambulatory Visit (INDEPENDENT_AMBULATORY_CARE_PROVIDER_SITE_OTHER): Payer: Medicare Other | Admitting: Psychiatry

## 2013-07-23 DIAGNOSIS — F4310 Post-traumatic stress disorder, unspecified: Secondary | ICD-10-CM | POA: Diagnosis not present

## 2013-07-23 DIAGNOSIS — F319 Bipolar disorder, unspecified: Secondary | ICD-10-CM

## 2013-07-23 NOTE — Progress Notes (Signed)
   THERAPIST PROGRESS NOTE  Session Time: 2:30-3:30 pm  Participation Level: Active  Behavioral Response: CasualAlertDepressed  Type of Therapy: Individual Therapy  Treatment Goals addressed: Anxiety and Coping  Interventions: Solution Focused and Social Skills Training  Summary: Regina Steele is a 32 y.o. female who presents with mild depressed mood and affect. Pt states she was admitted inpatient for suicidal thoughts following the addition of Depakote to her medication regimen.  Pt said this was her first inpatient stay at [facility] and she described it as both helpful and disrespectful based on how she was treated by nursing staff. Pt said her depression has lessoned and her main struggle continues to be her socially awkward behaviors that cause difficulties making relationships with others. Pt described how this has been her struggle since childhood and she is uncertain as to why she has difficulty fitting in.   Suicidal/Homicidal: Nowithout intent/plan  Therapist Response: Assessed Pts overall level of functioning and reviewed recent hospital stay and current level of depression symptoms.   Plan: Return again in 1 weeks.  Diagnosis: Axis I: Bipolar Disorder and PTSD    Axis II: No diagnosis    Carman Ching, LCSW 07/23/2013

## 2013-07-30 ENCOUNTER — Ambulatory Visit (INDEPENDENT_AMBULATORY_CARE_PROVIDER_SITE_OTHER): Payer: Medicare Other | Admitting: Psychiatry

## 2013-07-30 DIAGNOSIS — F4310 Post-traumatic stress disorder, unspecified: Secondary | ICD-10-CM

## 2013-07-30 DIAGNOSIS — F319 Bipolar disorder, unspecified: Secondary | ICD-10-CM | POA: Diagnosis not present
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
## 2013-08-02 ENCOUNTER — Other Ambulatory Visit (HOSPITAL_COMMUNITY): Payer: Self-pay | Admitting: Physician Assistant

## 2013-08-06 ENCOUNTER — Ambulatory Visit (INDEPENDENT_AMBULATORY_CARE_PROVIDER_SITE_OTHER): Payer: Medicare Other | Admitting: Psychiatry

## 2013-08-06 DIAGNOSIS — F319 Bipolar disorder, unspecified: Secondary | ICD-10-CM

## 2013-08-06 DIAGNOSIS — F4310 Post-traumatic stress disorder, unspecified: Secondary | ICD-10-CM
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
## 2013-08-10 ENCOUNTER — Ambulatory Visit (INDEPENDENT_AMBULATORY_CARE_PROVIDER_SITE_OTHER): Payer: Medicare Other | Admitting: Physician Assistant

## 2013-08-10 ENCOUNTER — Encounter: Payer: Self-pay | Admitting: Physician Assistant

## 2013-08-10 VITALS — BP 106/70 | HR 72 | Temp 97.6°F | Resp 18 | Wt 123.0 lb

## 2013-08-10 DIAGNOSIS — B850 Pediculosis due to Pediculus humanus capitis: Secondary | ICD-10-CM

## 2013-08-10 NOTE — Progress Notes (Signed)
Patient ID: Regina Steele MRN: [id number], DOB: [birth year], 32 y.o. Date of Encounter: 08/10/2013, 3:52 PM    Chief Complaint:  Chief Complaint  Patient presents with  . c/o head lice     HPI: 32 y.o. year old female reports that her daughter has recently been treated for head lice. Over the past couple of days the patient felt like her head was feeling itchy. However she had no one who could really exam and her hair to look for nits and lice. She went ahead and used an over-the-counter treatment on her hair yesterday. She came in today for further evaluation. Says it are some areas that have been itchy at the top of her head.  Home Meds: See attached medication section for any medications that were entered at today's visit. The computer does not put those onto this list.The following list is a list of meds entered prior to today's visit.   Current Outpatient Prescriptions on File Prior to Visit  Medication Sig Dispense Refill  . carbamazepine (TEGRETOL) 200 MG tablet Take 1 tablet (200 mg total) by mouth 4 (four) times daily. Mood stabilization  120 tablet  0  . cetirizine (ZYRTEC) 10 MG tablet Take 1 tablet (10 mg total) by mouth daily. For allergies      . docusate sodium (COLACE) 100 MG capsule Take 2 capsules (200 mg total) by mouth 2 (two) times daily. For constipation  10 capsule  0  . estradiol (ESTRACE) 2 MG tablet Take 1 tablet (2 mg total) by mouth at bedtime. For hormone replacement (Low estrogen)      . Estradiol (VAGIFEM) 10 MCG TABS vaginal tablet Place 1 tablet (10 mcg total) vaginally 2 (two) times a week. On Mondays and Thursdays: For Vaginal atrophy  8 tablet    . FLUoxetine 60 MG TABS Take 60 mg by mouth every morning. For depression  30 tablet  0  . fluticasone (FLONASE) 50 MCG/ACT nasal spray Place 2 sprays into the nose daily. For allergies    2  . ibuprofen (ADVIL,MOTRIN) 200 MG tablet Take 1 tablet (200 mg total) by mouth as needed for pain.  30 tablet  0  .  Linaclotide (LINZESS) 145 MCG CAPS capsule Take 1 capsule (145 mcg total) by mouth daily.  30 capsule  11  . LORazepam (ATIVAN) 1 MG tablet Take 1 tablet (1 mg total) by mouth 3 (three) times daily as needed for anxiety. For anxiety  12 tablet  0  . minocycline (MINOCIN,DYNACIN) 50 MG capsule Take 1 capsule (50 mg total) by mouth daily. For gastritis      . QUEtiapine (SEROQUEL) 100 MG tablet Take 1 tablet (100 mg total) by mouth at bedtime. For mood control  30 tablet  0  . risperiDONE (RISPERDAL) 2 MG tablet Take 1 tablet (2 mg total) by mouth 2 (two) times daily. For mood control  60 tablet  0  . topiramate (TOPAMAX) 100 MG tablet Take 1 tablet (100 mg total) by mouth 3 (three) times daily. For mood stabilization  90 tablet  0   No current facility-administered medications on file prior to visit.    Allergies: No Known Allergies    Review of Systems: See HPI for pertinent ROS. All other ROS negative.    Physical Exam: Blood pressure 106/70, pulse 72, temperature 97.6 F (36.4 C), temperature source Oral, resp. rate 18, weight 123 lb (55.792 kg), last menstrual period 12/14/2003., Body mass index is 22.49 kg/(m^2). General:  well-nourished well-developed female .Appears in no acute distress. HEENT: I have looked through her hair thoroughly. Started back at the base of her neck. I then also examined towards the top of the head where she says it has been itchy. I see no nits or lice. Also his scalp appears normal. I see no erythema or scaling to be causing itching.  Lungs: Clear bilaterally to auscultation without wheezes, rales, or rhonchi. Breathing is unlabored. Heart: Regular rhythm. No murmurs, rubs, or gallops. Msk:  Strength and tone normal for age. Neuro: Alert and oriented X 3. Moves all extremities spontaneously. Gait is normal. CNII-XII grossly in tact. Psych:  Responds to questions appropriately with a normal affect.     ASSESSMENT AND PLAN:  32 y.o. year old female with   1. exposure to Head lice I reassured her that I see no head lice ordinate today. However if she does see some then she can call me for followup. No further treatment at this time. She'll continue to avoid contact with her daughters until there infections are completely resolved.     [address] [place], [place], [place] 08/10/2013 3:52 PM

## 2013-08-12 ENCOUNTER — Other Ambulatory Visit (HOSPITAL_COMMUNITY): Payer: Self-pay | Admitting: Physician Assistant

## 2013-08-13 ENCOUNTER — Ambulatory Visit (INDEPENDENT_AMBULATORY_CARE_PROVIDER_SITE_OTHER): Payer: Medicare Other | Admitting: Psychiatry

## 2013-08-13 DIAGNOSIS — F319 Bipolar disorder, unspecified: Secondary | ICD-10-CM | POA: Diagnosis not present

## 2013-08-13 DIAGNOSIS — F4310 Post-traumatic stress disorder, unspecified: Secondary | ICD-10-CM

## 2013-08-14 ENCOUNTER — Encounter (HOSPITAL_COMMUNITY): Payer: Self-pay

## 2013-08-14 ENCOUNTER — Other Ambulatory Visit (HOSPITAL_COMMUNITY): Payer: Self-pay | Admitting: Psychiatry

## 2013-08-14 ENCOUNTER — Other Ambulatory Visit: Payer: Self-pay | Admitting: Physician Assistant

## 2013-08-14 NOTE — Progress Notes (Signed)
   THERAPIST PROGRESS NOTE  Session Time: 3:30-4:30 pm  Participation Level: Active  Behavioral Response: BizarreAlertAnxious  Type of Therapy: Individual Therapy  Treatment Goals addressed: Anxiety  Interventions: CBT and Supportive  Summary: Regina Steele is a 32 y.o. female who presents with severe anxious mood and beginnings of a panic attack as she talked about feeling under too much stress. Pt said she has neglected her personal self-care this past week due to her three children coming down with head lice that has caused Pt to be doing constant loads of laundry and shampooing and checking her children's hair. Pt said her chest was hurting from the anxiety and took an anxiety medication to ease symptoms. Pt agreed to do some heartmath to reduce anxiety and logged onto the [facility] program for five minutes. Pt was able to calm down enough to call her mother-in-law and husband to see if they could help her watch the children tonight so Pt could rest. Pt denied needing hospitalization and said she was safe to leave. Pt waited in the lobby after the session for her husband to drive her home.    Suicidal/Homicidal: Nowithout intent/plan  Therapist Response: Assessed level of anxiety per Pt self report and observations, taught breathing relaxation exercise to reduce panic symptoms and helped Pt make a supportive plan to get home and be safe.   Plan: Return again in 2 weeks.  Diagnosis: Axis I: Bipolar Disorder and PTSD    Axis II: No diagnosis    Doreen Garretson E, LCSW 08/14/2013

## 2013-08-14 NOTE — Telephone Encounter (Signed)
Medication refilled per protocol. 

## 2013-08-17 ENCOUNTER — Ambulatory Visit (HOSPITAL_COMMUNITY): Payer: Self-pay | Admitting: Psychiatry

## 2013-08-19 ENCOUNTER — Encounter (HOSPITAL_COMMUNITY): Payer: Self-pay | Admitting: Psychiatry

## 2013-08-19 ENCOUNTER — Ambulatory Visit (INDEPENDENT_AMBULATORY_CARE_PROVIDER_SITE_OTHER): Payer: Medicare Other | Admitting: Psychiatry

## 2013-08-19 VITALS — BP 118/82 | HR 75 | Ht 62.75 in | Wt 127.6 lb

## 2013-08-19 DIAGNOSIS — F4310 Post-traumatic stress disorder, unspecified: Secondary | ICD-10-CM | POA: Diagnosis not present

## 2013-08-19 DIAGNOSIS — F319 Bipolar disorder, unspecified: Secondary | ICD-10-CM

## 2013-08-19 MED ORDER — TOPIRAMATE 100 MG PO TABS: 100.0000 mg | ORAL_TABLET | Freq: Two times a day (BID) | ORAL | Status: DC

## 2013-08-19 MED ORDER — ESCITALOPRAM OXALATE 20 MG PO TABS: ORAL_TABLET | ORAL | Status: DC

## 2013-08-19 MED ORDER — LORAZEPAM 1 MG PO TABS: 1.0000 mg | ORAL_TABLET | Freq: Two times a day (BID) | ORAL | Status: DC

## 2013-08-19 MED ORDER — MIRTAZAPINE 15 MG PO TABS: 15.0000 mg | ORAL_TABLET | Freq: Every day | ORAL | Status: DC

## 2013-08-19 MED ORDER — CARBAMAZEPINE 200 MG PO TABS: 200.0000 mg | ORAL_TABLET | Freq: Four times a day (QID) | ORAL | Status: DC

## 2013-08-19 MED ORDER — RISPERIDONE 2 MG PO TABS: 2.0000 mg | ORAL_TABLET | Freq: Two times a day (BID) | ORAL | Status: DC

## 2013-08-19 NOTE — Patient Instructions (Signed)
Stop Seroquel at bed time Start Remeron (Mirtazepaine) 15 mg at bed time Reduce Topamax to twice a day Reduce Prozac 20 mg daily for 1 week and than stop. Start Lexapro 1/2 tab daily for 1 week and than 1 tab daily.  Get tegretol level. Continue Tegretol, Ativan and Risperdal same dose and directions.

## 2013-08-19 NOTE — Addendum Note (Signed)
Addended by: Kathryne Sharper T on: 08/19/2013 03:26 PM   Modules accepted: Orders
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
## 2013-08-20 ENCOUNTER — Telehealth (HOSPITAL_COMMUNITY): Payer: Self-pay | Admitting: *Deleted

## 2013-08-20 ENCOUNTER — Ambulatory Visit (HOSPITAL_COMMUNITY): Payer: Self-pay | Admitting: Psychiatry

## 2013-08-20 NOTE — Telephone Encounter (Signed)
Attempted prior authorization of Escitalopram 20mg . Called [facility] was told they will not authorize due to patient medication Quetiapine Furmate 50mg  that was filled 08/14/13 by Jorje Guild at [facility] [phone]. Called and spoke to pharmacist to confirm that patient did fill prescription. It appears a refill was on file and patient filled it. On note 08/19/13 [name] had discontinued Quetiapine Furmate 50mg . Authorization will not be approved at this time. Called back to [place] spoke with different representative who stated authorization not approved due to Fluoxetine. Explained rational of discontinuing Fluoxetine after 1 week. Per Nash Dimmer at [place] a decision will be made within 24-48 hours with approval/denial being faxed.

## 2013-08-26 ENCOUNTER — Encounter (HOSPITAL_COMMUNITY): Payer: Self-pay | Admitting: *Deleted

## 2013-08-26 NOTE — Progress Notes (Signed)
Prior Authorization received from [facility] for Lexapro 20 mg.

## 2013-08-28 DIAGNOSIS — F319 Bipolar disorder, unspecified: Secondary | ICD-10-CM | POA: Diagnosis not present

## 2013-08-29 LAB — CARBAMAZEPINE LEVEL, TOTAL: Carbamazepine Lvl: <0.3 ug/mL — ABNORMAL LOW (ref 4.0–12.0)

## 2013-09-02 ENCOUNTER — Ambulatory Visit (HOSPITAL_COMMUNITY): Payer: Self-pay | Admitting: Physician Assistant

## 2013-09-03 ENCOUNTER — Encounter (HOSPITAL_COMMUNITY): Payer: Self-pay | Admitting: Psychiatry

## 2013-09-03 ENCOUNTER — Ambulatory Visit (INDEPENDENT_AMBULATORY_CARE_PROVIDER_SITE_OTHER): Payer: Medicare Other | Admitting: Psychiatry

## 2013-09-03 VITALS — BP 114/76 | HR 68 | Wt 126.0 lb

## 2013-09-03 DIAGNOSIS — F319 Bipolar disorder, unspecified: Secondary | ICD-10-CM | POA: Diagnosis not present

## 2013-09-03 DIAGNOSIS — F4310 Post-traumatic stress disorder, unspecified: Secondary | ICD-10-CM | POA: Diagnosis not present

## 2013-09-03 MED ORDER — CARBAMAZEPINE 200 MG PO TABS: 200.0000 mg | ORAL_TABLET | Freq: Four times a day (QID) | ORAL | Status: DC

## 2013-09-03 MED ORDER — TOPIRAMATE 100 MG PO TABS: 100.0000 mg | ORAL_TABLET | Freq: Two times a day (BID) | ORAL | Status: DC

## 2013-09-03 MED ORDER — RISPERIDONE 2 MG PO TABS: 2.0000 mg | ORAL_TABLET | Freq: Two times a day (BID) | ORAL | Status: DC

## 2013-09-03 MED ORDER — MIRTAZAPINE 15 MG PO TABS: 15.0000 mg | ORAL_TABLET | Freq: Every day | ORAL | Status: DC

## 2013-09-03 MED ORDER — ESCITALOPRAM OXALATE 20 MG PO TABS: ORAL_TABLET | ORAL | Status: DC
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
## 2013-09-04 ENCOUNTER — Other Ambulatory Visit (HOSPITAL_COMMUNITY): Payer: Self-pay | Admitting: Psychiatry

## 2013-09-04 NOTE — Telephone Encounter (Signed)
I called patient.  She is requesting Ativan.  She is taking Ativan only as needed.  She has one request of Ativan.  I told that we will provide 30 pills since she is taking only as needed.  Patient agreed.  I will call pharmacy.

## 2013-09-08 ENCOUNTER — Ambulatory Visit (INDEPENDENT_AMBULATORY_CARE_PROVIDER_SITE_OTHER): Payer: Medicare Other | Admitting: Psychiatry

## 2013-09-08 DIAGNOSIS — F319 Bipolar disorder, unspecified: Secondary | ICD-10-CM | POA: Diagnosis not present

## 2013-09-08 DIAGNOSIS — F4310 Post-traumatic stress disorder, unspecified: Secondary | ICD-10-CM

## 2013-09-08 NOTE — Progress Notes (Signed)
   THERAPIST PROGRESS NOTE  Session Time: 3:30-4:20 pm   Participation Level: Active  Behavioral Response: Bizarre and CasualAlertDepressed  Type of Therapy: Individual Therapy  Treatment Goals addressed: Coping  Interventions: CBT and Supportive  Summary: Regina Steele is a 32 y.o. female who presents with moderate depressed mood and affect. Pt appears less socially awkward with increased ability to focus and concentrate. Pt said she has been struggling with some medication changes that her psychiatrist has been making and she is noticing increased feelings of depression. Pt is less anxious than previous session. Pt expressed a continued desire to go back to school, but is aware in this state of functioning that would not be possible. Pt said she felt her best when she was working outside the home and said she plans to explore opportunities to volunteer for the upcoming political election. Pt appeared hopeful about the idea of doing something outside of the house.   Suicidal/Homicidal: Nowithout intent/plan  Therapist Response: Assessed increase in depression symptoms per Pt self report and possible causes for the increase. Explored when Pt was happier and encouraged Pt do something outside the home that would give her a sense of purpose.   Plan: Return again in 1 weeks. Follow up with Pt exploring volunteer opportunities.   Diagnosis: Axis I: Bipolar Disorder and PTSD    Axis II: No diagnosis    Illene Sweeting E, LCSW 09/08/2013

## 2013-09-09 DIAGNOSIS — Z124 Encounter for screening for malignant neoplasm of cervix: Secondary | ICD-10-CM | POA: Diagnosis not present

## 2013-09-09 DIAGNOSIS — Z79890 Hormone replacement therapy (postmenopausal): Secondary | ICD-10-CM | POA: Diagnosis not present

## 2013-09-09 DIAGNOSIS — M810 Age-related osteoporosis without current pathological fracture: Secondary | ICD-10-CM | POA: Diagnosis not present

## 2013-09-09 DIAGNOSIS — N318 Other neuromuscular dysfunction of bladder: Secondary | ICD-10-CM | POA: Diagnosis not present

## 2013-09-09 DIAGNOSIS — M899 Disorder of bone, unspecified: Secondary | ICD-10-CM | POA: Diagnosis not present

## 2013-09-15 ENCOUNTER — Ambulatory Visit (INDEPENDENT_AMBULATORY_CARE_PROVIDER_SITE_OTHER): Payer: Medicare Other | Admitting: Psychiatry

## 2013-09-15 DIAGNOSIS — F319 Bipolar disorder, unspecified: Secondary | ICD-10-CM

## 2013-09-15 DIAGNOSIS — F4310 Post-traumatic stress disorder, unspecified: Secondary | ICD-10-CM | POA: Diagnosis not present
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
## 2013-09-16 DIAGNOSIS — F909 Attention-deficit hyperactivity disorder, unspecified type: Secondary | ICD-10-CM | POA: Diagnosis not present

## 2013-09-16 DIAGNOSIS — F0630 Mood disorder due to known physiological condition, unspecified: Secondary | ICD-10-CM | POA: Diagnosis not present

## 2013-09-17 ENCOUNTER — Other Ambulatory Visit: Payer: Self-pay

## 2013-09-22 ENCOUNTER — Ambulatory Visit (HOSPITAL_COMMUNITY): Payer: Self-pay | Admitting: Psychiatry

## 2013-09-29 ENCOUNTER — Telehealth (HOSPITAL_COMMUNITY): Payer: Self-pay | Admitting: *Deleted

## 2013-09-29 ENCOUNTER — Telehealth (HOSPITAL_COMMUNITY): Payer: Self-pay | Admitting: Psychiatry

## 2013-09-29 ENCOUNTER — Ambulatory Visit (INDEPENDENT_AMBULATORY_CARE_PROVIDER_SITE_OTHER): Payer: Medicare Other | Admitting: Psychiatry

## 2013-09-29 DIAGNOSIS — F319 Bipolar disorder, unspecified: Secondary | ICD-10-CM

## 2013-09-29 DIAGNOSIS — F4310 Post-traumatic stress disorder, unspecified: Secondary | ICD-10-CM | POA: Diagnosis not present

## 2013-09-29 MED ORDER — TOPIRAMATE 100 MG PO TABS: 100.0000 mg | ORAL_TABLET | Freq: Three times a day (TID) | ORAL | Status: DC

## 2013-09-29 NOTE — Telephone Encounter (Signed)
VM:Was told to call if medication changes not successful. Could not decrease Topamax to twice a day, tried.Was in bed with debilitating Migraines, lasted 2-3 days. Wanted to stay in bed, felt depressed. Needs new RX for Topamax 3 times a day.[name] Has returned to taking Lorazepam, twice a day. Needs new RX called in for Lorazepam twice a day on Thursday or Friday of this week
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
## 2013-10-05 ENCOUNTER — Other Ambulatory Visit (HOSPITAL_COMMUNITY): Payer: Self-pay | Admitting: Psychiatry

## 2013-10-05 DIAGNOSIS — F319 Bipolar disorder, unspecified: Secondary | ICD-10-CM

## 2013-10-06 ENCOUNTER — Ambulatory Visit (INDEPENDENT_AMBULATORY_CARE_PROVIDER_SITE_OTHER): Payer: Medicare Other | Admitting: Psychiatry

## 2013-10-06 DIAGNOSIS — F319 Bipolar disorder, unspecified: Secondary | ICD-10-CM
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
## 2013-10-13 ENCOUNTER — Ambulatory Visit (HOSPITAL_COMMUNITY): Payer: Self-pay | Admitting: Psychiatry

## 2013-10-15 ENCOUNTER — Other Ambulatory Visit (HOSPITAL_COMMUNITY): Payer: Self-pay | Admitting: Psychiatry

## 2013-10-15 ENCOUNTER — Ambulatory Visit (HOSPITAL_COMMUNITY): Payer: Self-pay | Admitting: Psychiatry

## 2013-10-15 DIAGNOSIS — F319 Bipolar disorder, unspecified: Secondary | ICD-10-CM

## 2013-10-16 ENCOUNTER — Other Ambulatory Visit (HOSPITAL_COMMUNITY): Payer: Self-pay | Admitting: Psychiatry

## 2013-10-17 ENCOUNTER — Other Ambulatory Visit (HOSPITAL_COMMUNITY): Payer: Self-pay | Admitting: Psychiatry

## 2013-10-19 ENCOUNTER — Telehealth (HOSPITAL_COMMUNITY): Payer: Self-pay | Admitting: *Deleted

## 2013-10-19 ENCOUNTER — Ambulatory Visit (INDEPENDENT_AMBULATORY_CARE_PROVIDER_SITE_OTHER): Payer: Medicare Other | Admitting: Psychiatry

## 2013-10-19 ENCOUNTER — Encounter (HOSPITAL_COMMUNITY): Payer: Self-pay | Admitting: Psychiatry

## 2013-10-19 VITALS — BP 134/84 | HR 78 | Ht 62.75 in | Wt 129.8 lb

## 2013-10-19 DIAGNOSIS — F4310 Post-traumatic stress disorder, unspecified: Secondary | ICD-10-CM

## 2013-10-19 DIAGNOSIS — F319 Bipolar disorder, unspecified: Secondary | ICD-10-CM | POA: Diagnosis not present

## 2013-10-19 MED ORDER — CARBAMAZEPINE 200 MG PO TABS: 200.0000 mg | ORAL_TABLET | Freq: Four times a day (QID) | ORAL | Status: DC

## 2013-10-19 MED ORDER — TOPIRAMATE 100 MG PO TABS: 100.0000 mg | ORAL_TABLET | Freq: Three times a day (TID) | ORAL | Status: DC

## 2013-10-19 MED ORDER — LORAZEPAM 1 MG PO TABS: ORAL_TABLET | ORAL | Status: DC

## 2013-10-19 MED ORDER — ESCITALOPRAM OXALATE 20 MG PO TABS: ORAL_TABLET | ORAL | Status: DC

## 2013-10-19 MED ORDER — RISPERIDONE 2 MG PO TABS: ORAL_TABLET | ORAL | Status: DC
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
## 2013-10-19 NOTE — Telephone Encounter (Signed)
Pt left VM 1229 12/5:VM recv'd 12/8 @ 0844: Parents told her to call in.Lorazepam was reduced to 1/day.Life very difficult/stressful/shaky/panic attacks.Requests increase to 2 x day.

## 2013-10-26 DIAGNOSIS — J329 Chronic sinusitis, unspecified: Secondary | ICD-10-CM | POA: Diagnosis not present

## 2013-10-28 DIAGNOSIS — F319 Bipolar disorder, unspecified: Secondary | ICD-10-CM | POA: Diagnosis not present

## 2013-10-29 LAB — CARBAMAZEPINE LEVEL, TOTAL: Carbamazepine Lvl: 5.7 ug/mL (ref 4.0–12.0)

## 2013-10-31 ENCOUNTER — Encounter (HOSPITAL_COMMUNITY): Payer: Self-pay | Admitting: Emergency Medicine

## 2013-10-31 ENCOUNTER — Emergency Department (HOSPITAL_COMMUNITY)
Admission: EM | Admit: 2013-10-31 | Discharge: 2013-10-31 | Disposition: A | Discharge status: Home / Self Care | Payer: Medicare Other | Attending: Emergency Medicine | Admitting: Emergency Medicine

## 2013-10-31 DIAGNOSIS — Z792 Long term (current) use of antibiotics: Secondary | ICD-10-CM | POA: Insufficient documentation

## 2013-10-31 DIAGNOSIS — F411 Generalized anxiety disorder: Secondary | ICD-10-CM | POA: Insufficient documentation

## 2013-10-31 DIAGNOSIS — Z8742 Personal history of other diseases of the female genital tract: Secondary | ICD-10-CM | POA: Diagnosis not present

## 2013-10-31 DIAGNOSIS — Z008 Encounter for other general examination: Secondary | ICD-10-CM | POA: Diagnosis not present

## 2013-10-31 DIAGNOSIS — Z8739 Personal history of other diseases of the musculoskeletal system and connective tissue: Secondary | ICD-10-CM | POA: Insufficient documentation

## 2013-10-31 DIAGNOSIS — F4310 Post-traumatic stress disorder, unspecified: Secondary | ICD-10-CM | POA: Diagnosis not present

## 2013-10-31 DIAGNOSIS — F329 Major depressive disorder, single episode, unspecified: Secondary | ICD-10-CM | POA: Insufficient documentation

## 2013-10-31 DIAGNOSIS — F3289 Other specified depressive episodes: Secondary | ICD-10-CM | POA: Insufficient documentation

## 2013-10-31 DIAGNOSIS — IMO0002 Reserved for concepts with insufficient information to code with codable children: Secondary | ICD-10-CM | POA: Diagnosis not present

## 2013-10-31 DIAGNOSIS — Z79899 Other long term (current) drug therapy: Secondary | ICD-10-CM | POA: Insufficient documentation

## 2013-10-31 DIAGNOSIS — K5900 Constipation, unspecified: Secondary | ICD-10-CM | POA: Insufficient documentation

## 2013-10-31 DIAGNOSIS — F419 Anxiety disorder, unspecified: Secondary | ICD-10-CM

## 2013-10-31 DIAGNOSIS — F341 Dysthymic disorder: Secondary | ICD-10-CM | POA: Diagnosis not present
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
## 2013-10-31 NOTE — ED Notes (Signed)
Pt from home reports that she has had depression with lethargy and PTSD with vivid dreams. Pt states that these "memories are pushing in on her head" and prevents her from doing normal activities. Pt states that [name] told her to try not to take her Lorazepam unless needed. Pt stopped taking 2 days ago and feels that she may be having withdrawals. Pt took 1 Lorazepam this am with no relief from symptoms.Pt states " I am exhausted with this whole process." Pt is A&O but rambling with flat-affect.Pt denies self-harm and states that she has never wanted to harm herself.

## 2013-11-01 NOTE — ED Provider Notes (Signed)
Medical screening examination/treatment/procedure(s) were performed by non-physician practitioner and as supervising physician I was immediately available for consultation/collaboration.  Uzma Hellmer L Kota Ciancio, MD 11/01/13 0039 

## 2013-11-02 ENCOUNTER — Telehealth (HOSPITAL_COMMUNITY): Payer: Self-pay | Admitting: *Deleted

## 2013-11-02 ENCOUNTER — Telehealth: Payer: Self-pay | Admitting: Gastroenterology

## 2013-11-02 MED ORDER — LACTULOSE 20 GM/30ML PO SOLN: 30.0000 mL | Freq: Every day | ORAL | Status: DC | PRN

## 2013-11-02 MED ORDER — LINACLOTIDE 145 MCG PO CAPS: ORAL_CAPSULE | ORAL | Status: DC
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
## 2013-11-02 NOTE — Telephone Encounter (Signed)
Pt with hx of Colonic Motility Disorder, Slow Transit Constipation and an eating disorder last seen 03/12/13. Pt reports her OB?GYN gave her Vesicare for her urinary problems and that has made her constipated. She has had to take 2-145 mcg of Linzess to have a BM . OK for her to increase to 290 mcg of Linzess and can she have a PRN script of Lactulose per her request? Thanks.

## 2013-11-02 NOTE — Telephone Encounter (Signed)
Informed the pt to stop the Vesicare and we ordered an increase in Linzess and prn lactulose. Pt stated understanding,

## 2013-11-02 NOTE — Telephone Encounter (Signed)
Yes... she should stop VESIcare.

## 2013-11-03 ENCOUNTER — Emergency Department (HOSPITAL_COMMUNITY)
Admission: EM | Admit: 2013-11-03 | Discharge: 2013-11-03 | Discharge status: Against Medical Advice | Payer: Medicare Other | Attending: Emergency Medicine | Admitting: Emergency Medicine

## 2013-11-03 ENCOUNTER — Encounter (HOSPITAL_COMMUNITY): Payer: Self-pay | Admitting: Emergency Medicine

## 2013-11-03 ENCOUNTER — Telehealth: Payer: Self-pay | Admitting: *Deleted

## 2013-11-03 DIAGNOSIS — R5381 Other malaise: Secondary | ICD-10-CM | POA: Insufficient documentation

## 2013-11-03 DIAGNOSIS — K5900 Constipation, unspecified: Secondary | ICD-10-CM | POA: Diagnosis not present

## 2013-11-03 DIAGNOSIS — G8929 Other chronic pain: Secondary | ICD-10-CM | POA: Diagnosis not present

## 2013-11-03 DIAGNOSIS — R51 Headache: Secondary | ICD-10-CM | POA: Diagnosis not present

## 2013-11-03 DIAGNOSIS — R630 Anorexia: Secondary | ICD-10-CM | POA: Diagnosis not present

## 2013-11-03 DIAGNOSIS — Z79899 Other long term (current) drug therapy: Secondary | ICD-10-CM | POA: Diagnosis not present

## 2013-11-03 DIAGNOSIS — R42 Dizziness and giddiness: Secondary | ICD-10-CM | POA: Diagnosis not present

## 2013-11-03 DIAGNOSIS — F4310 Post-traumatic stress disorder, unspecified: Secondary | ICD-10-CM | POA: Diagnosis not present

## 2013-11-03 DIAGNOSIS — R0981 Nasal congestion: Secondary | ICD-10-CM

## 2013-11-03 DIAGNOSIS — Z8739 Personal history of other diseases of the musculoskeletal system and connective tissue: Secondary | ICD-10-CM | POA: Insufficient documentation

## 2013-11-03 DIAGNOSIS — Z8742 Personal history of other diseases of the female genital tract: Secondary | ICD-10-CM | POA: Insufficient documentation

## 2013-11-03 DIAGNOSIS — F329 Major depressive disorder, single episode, unspecified: Secondary | ICD-10-CM | POA: Insufficient documentation

## 2013-11-03 DIAGNOSIS — F341 Dysthymic disorder: Secondary | ICD-10-CM | POA: Diagnosis not present

## 2013-11-03 DIAGNOSIS — H938X9 Other specified disorders of ear, unspecified ear: Secondary | ICD-10-CM | POA: Insufficient documentation

## 2013-11-03 DIAGNOSIS — IMO0002 Reserved for concepts with insufficient information to code with codable children: Secondary | ICD-10-CM | POA: Insufficient documentation

## 2013-11-03 DIAGNOSIS — R059 Cough, unspecified: Secondary | ICD-10-CM | POA: Diagnosis not present

## 2013-11-03 DIAGNOSIS — F3289 Other specified depressive episodes: Secondary | ICD-10-CM | POA: Insufficient documentation

## 2013-11-03 DIAGNOSIS — R6883 Chills (without fever): Secondary | ICD-10-CM | POA: Insufficient documentation

## 2013-11-03 DIAGNOSIS — R05 Cough: Secondary | ICD-10-CM | POA: Insufficient documentation

## 2013-11-03 DIAGNOSIS — F411 Generalized anxiety disorder: Secondary | ICD-10-CM | POA: Insufficient documentation

## 2013-11-03 DIAGNOSIS — J3489 Other specified disorders of nose and nasal sinuses: Secondary | ICD-10-CM | POA: Insufficient documentation

## 2013-11-03 NOTE — Telephone Encounter (Addendum)
Left message that [name] intended for her to take Ativan 1 mg, 1-2 per day as needed.In progress note dated 10/19/13. [name] requested pt be contacted to find out when last RX for Ativan given. Per [name], if pt needs RX for Ativan and it was not given last appt , it can be called to pt pharmacy.   Called pharmacy on pt record to verify last refill of Ativan Per [facility] at [facility]-Last RX they filled for Ativan was dated 10/19/13 and filled 10/22/13 for #[id number] 1-2 per day as needed.

## 2013-11-03 NOTE — ED Notes (Signed)
Pt left prior to receiving d/c instructions. Pt told PA "I just want to leave." Pt walked out of ED with steady gait. No acute distress.

## 2013-11-03 NOTE — ED Provider Notes (Signed)
CSN: [id number]     Arrival date & time 11/03/13  1250 History  This chart was scribed for Coral Ceo, PA working with Ward Givens, MD by Quintella Reichert, ED Scribe. This patient was seen in room WTR6/WTR6 and the patient's care was started at 3:52 PM.   Chief Complaint  Patient presents with  . Generalized Body Aches  . Cough    The history is provided by the patient. No language interpreter was used.    HPI Comments: Regina Steele is a 32 y.o. female with a PMH of PTSD, depression, chronic headache, anxiety, hiatal hernia, constipation, esophageal reflux, eating disorder, and osteopenia who presents to the Emergency Department for multiple complaints.  Patient states she has had 2 weeks of persistent worsening nasal congestion with associated sinus pressure, rhinorrhea, fatigue, chills, ear pressure, headaches, lightheadedness, and generalized body aches.  She went to and urgent care and was placed on an antibiotic Ceftin for a sinus infection.  She has been taking this as instructed for 8-9 days and has approximately one day left.  She states that her symptoms have not improved at all and for the past 3 days her fatigue has been worsening.  She states she also feels lightheaded "like I'm about to pass out."  She has attempted to treat symptoms with Sudafed without relief.   She denies fever, wheezing, cough, CP, SOB, vomiting, diarrhea, sore throat, urinary symptoms, or tinnitus.  She states her appetite and thirst have been slightly decreased.  Pt admits to prior h/o recurrent sinus infections but states that in the past they were always treated successfully within a few days by antibiotics.  She states she has been on Augmentin and Z-pack for previous infections and both of those were effective.     Past Medical History  Diagnosis Date  . PTSD (post-traumatic stress disorder)   . Uterine prolapse 2013  . Depression   . Hiatal hernia   . Surgical menopause   . Chronic headache    . Constipation   . Esophageal reflux     no meds  . Osteopenia   . Eating disorder   . Anxiety     Past Surgical History  Procedure Laterality Date  . Bilateral oophorectomy      bilat  . Rectocele repair    . Cesarean section      Family History  Problem Relation Age of Onset  . Breast cancer Maternal Grandmother 71  . Breast cancer Paternal Aunt 40  . Celiac disease Paternal Grandmother   . Heart disease Father   . Colon cancer Neg Hx   . Mental illness Mother   . Bipolar disorder Maternal Grandmother   . Mental illness Brother     History  Substance Use Topics  . Smoking status: Never Smoker   . Smokeless tobacco: Never Used  . Alcohol Use: No    OB History   Grav Para Term Preterm Abortions TAB SAB Ect Mult Living   3 3 3       3       Review of Systems  Constitutional: Positive for chills, appetite change and fatigue. Negative for fever.  HENT: Positive for congestion, ear pain (pressure), rhinorrhea and sinus pressure. Negative for sore throat and tinnitus.   Respiratory: Negative for cough, shortness of breath and wheezing.   Cardiovascular: Negative for chest pain.  Gastrointestinal: Negative for vomiting and diarrhea.  Genitourinary: Negative for dysuria and difficulty urinating.  Musculoskeletal: Positive for myalgias.  Neurological: Positive for light-headedness and headaches.  All other systems reviewed and are negative.   Allergies  Review of patient's allergies indicates no known allergies.  Home Medications   Current Outpatient Rx  Name  Route  Sig  Dispense  Refill  . cetirizine (ZYRTEC) 10 MG tablet   Oral   Take 10 mg by mouth daily.         [name] docusate sodium (COLACE) 100 MG capsule   Oral   Take 2 capsules (200 mg total) by mouth 2 (two) times daily. For constipation   10 capsule   0   . escitalopram (LEXAPRO) 20 MG tablet      Take 1 tab daily   30 tablet   0   . estradiol (ESTRACE) 2 MG tablet   Oral   Take 1 tablet  (2 mg total) by mouth at bedtime. For hormone replacement (Low estrogen)         . Estradiol (VAGIFEM) 10 MCG TABS vaginal tablet   Vaginal   Place 1 tablet (10 mcg total) vaginally 2 (two) times a week. On Mondays and Thursdays: For Vaginal atrophy   8 tablet      . fluticasone (FLONASE) 50 MCG/ACT nasal spray   Nasal   Place 2 sprays into the nose daily as needed for allergies or rhinitis.         [name] ibuprofen (ADVIL,MOTRIN) 200 MG tablet   Oral   Take 1 tablet (200 mg total) by mouth as needed for pain.   30 tablet   0   . Lactulose 20 GM/30ML SOLN   Oral   Take 30 mLs (20 g total) by mouth daily as needed (take 30 ml of Lactulose by mouth daily as needed for constipation).   240 mL   2   . Linaclotide (LINZESS) 145 MCG CAPS capsule      Take 1-2 capsules of 145 mcg Linzess by mouth daily for constipation.   60 capsule   11   . LORazepam (ATIVAN) 1 MG tablet   Oral   Take 1-2 mg by mouth 2 (two) times daily. scheduled         . mirtazapine (REMERON) 15 MG tablet      TAKE 1 TABLET (15 MG TOTAL) BY MOUTH AT BEDTIME.   30 tablet   0   . risperiDONE (RISPERDAL) 2 MG tablet      TAKE 1 TABLET (2 MG TOTAL) BY MOUTH 2 (TWO) TIMES DAILY. FOR MOOD CONTROL   60 tablet   0   . topiramate (TOPAMAX) 100 MG tablet   Oral   Take 1 tablet (100 mg total) by mouth 3 (three) times daily. For mood stabilization   90 tablet   0    BP 113/64  Pulse 64  Temp(Src) 98.2 F (36.8 C) (Oral)  Resp 20  SpO2 100%  LMP 12/14/2003  Filed Vitals:   11/03/13 1337  BP: 113/64  Pulse: 64  Temp: 98.2 F (36.8 C)  TempSrc: Oral  Resp: 20  SpO2: 100%    Physical Exam  Nursing note and vitals reviewed. Constitutional: She is oriented to person, place, and time. She appears well-developed and well-nourished. No distress.  Flat affect  HENT:  Head: Normocephalic and atraumatic.  Right Ear: External ear normal.  Left Ear: External ear normal.  Nose: Nose normal.   Mouth/Throat: Oropharynx is clear and moist. No oropharyngeal exudate.  Tympanic membranes gray and translucent bilaterally.  No sinus pressure throughout.  No tenderness to the scalp or face throughout. Nasal congestion.  Nasal turbinates with mild erythema.    Eyes: Conjunctivae and EOM are normal. Pupils are equal, round, and reactive to light. Right eye exhibits no discharge. Left eye exhibits no discharge.  Neck: Normal range of motion. Neck supple.  Cardiovascular: Normal rate, regular rhythm and normal heart sounds.  Exam reveals no gallop and no friction rub.   No murmur heard. Pulmonary/Chest: Effort normal and breath sounds normal. No respiratory distress.  Abdominal: Soft. She exhibits no distension. There is no tenderness.  Musculoskeletal: Normal range of motion. She exhibits no edema and no tenderness.  Patient able to ambulate without difficulty or ataxia.    Lymphadenopathy:    She has no cervical adenopathy.  Neurological: She is alert and oriented to person, place, and time.  GCS 15.  No focal neurological deficits.  CN 2-12 intact.    Skin: Skin is warm and dry. She is not diaphoretic. No erythema.     ED Course  Procedures (including critical care time)  DIAGNOSTIC STUDIES: Oxygen Saturation is 100% on room air, normal by my interpretation.    COORDINATION OF CARE: 4:02 PM: Discussed treatment plan which includes records review and possible referral to ENT.  Pt expressed understanding and agreed to plan.  Labs Review Labs Reviewed - No data to display  Imaging Review No results found.  EKG Interpretation   None       MDM   Regina Steele is a 32 y.o. female with a PMH of PTSD, depression, chronic headache, anxiety, hiatal hernia, constipation, esophageal reflux, eating disorder, and osteopenia who presents to the Emergency Department for multiple complaints.   Rechecks  4:30 PM = Informed patient she is on a good course of antibiotics, which she  has not yet completed.  Patient upset about her condition.  States "I am just so tired" and "I just sleep all day" and "I am not getting better."  Spoke with patient about doing labs and getting an IV for IV fluids since she has not been eating or drinking adequately.  She states that she "just wants to go home and pick up her kids."  I expressed my concern about her condition and that I would like to do further evaluation.  She started crying stating she has to pick up her kids from the daycare and "no one else can pick them up."  She talks about her husband and how she has a restraining order on him.  Patient becomes more upset when talking about this.  Offered to have someone come talk to the patient about her depression but she refused.  Patient seen here in the ED a few days ago on 10/31/13 for depression and anxiety.  I asked if she is suicidal and she stated she was not and wanted to go home and open Christmas presents with her children.  Asked patient to wait to sign out but she put on her coat and eloped the ED.  I advised her of the risks of leaving AMA and she verbalized these.      Patient seen in the ED for multiple complaints.  She complains of a sinus infection diagnosed by an urgent care which has not been improving on antibiotics.  Patient has a hx of sinus infections with similar symptoms today.  Informed patient she was on a good course of antibiotics and she needs ENT follow-up for chronic sinus infection management.  Patient became upset about  her continued fatigue, weakness, and lightheadedness.  I informed her that further evaluation was warranted, however, patient refused any diagnostic testing due to time constraints.  She also endorsed feelings of continued anxiety and depression, however, had no suicidal ideations.  Patient seen in the ED on 10/31/13 for anxiety due to being out of her Ativan.  She has a psychiatrist [name] who she saw on 10/19/13.  Patient left AMA.      Discharge Medication List as of 11/03/2013  4:37 PM       Final impressions: 1. Sinus congestion   2. Anxiety and depression       Luiz Iron PA-C         Jillyn Ledger, PA-C 11/06/13 [phone]

## 2013-11-03 NOTE — Telephone Encounter (Signed)
I called Medicaid at [phone] to start a prior auth for patient's Linzess. Per office note on 05-06-2012 patient has tried Amitiza.. Patient has tried Lactulose as well. I spoke with Tiffany at [facility] and per Tiffany patient has Medicare. Linzess needs to be ran under her Medicare D coverage. I called [facility] at [phone] and spoke with Cala Bradford.  I advised Cala Bradford that they are running RX for Linzess under patients Medicaid insurance not her Medicare. Needs to be under Medicare. Per Cala Bradford will fix the issue and contact the patient. Gave pharmacy patient's Medicare number and [facility] will call the patient as well.

## 2013-11-03 NOTE — ED Notes (Signed)
Pt reports having cough, nasal congestion with yellow mucus, weakness,  fatigue, and generalized body aches that began two weeks ago.Pt was seen at an urgent care and diagnosed with a sinus infection was given an antibiotic (cefrin), which she has taken to completion. Pt states that her symptoms have not improved. Pt is A/O x4, in NAD, and vital signs are WDL.

## 2013-11-04 DIAGNOSIS — J029 Acute pharyngitis, unspecified: Secondary | ICD-10-CM | POA: Diagnosis not present

## 2013-11-04 DIAGNOSIS — J019 Acute sinusitis, unspecified: Secondary | ICD-10-CM | POA: Diagnosis not present

## 2013-11-11 ENCOUNTER — Encounter: Payer: Self-pay | Admitting: Physician Assistant

## 2013-11-11 ENCOUNTER — Ambulatory Visit (INDEPENDENT_AMBULATORY_CARE_PROVIDER_SITE_OTHER): Payer: Medicare Other | Admitting: Physician Assistant

## 2013-11-11 VITALS — BP 114/76 | HR 76 | Temp 97.3°F | Resp 18 | Wt 129.0 lb

## 2013-11-11 DIAGNOSIS — M25541 Pain in joints of right hand: Secondary | ICD-10-CM

## 2013-11-11 DIAGNOSIS — R635 Abnormal weight gain: Secondary | ICD-10-CM

## 2013-11-11 DIAGNOSIS — M25542 Pain in joints of left hand: Secondary | ICD-10-CM

## 2013-11-11 DIAGNOSIS — M810 Age-related osteoporosis without current pathological fracture: Secondary | ICD-10-CM | POA: Insufficient documentation

## 2013-11-11 DIAGNOSIS — M25549 Pain in joints of unspecified hand: Secondary | ICD-10-CM | POA: Diagnosis not present

## 2013-11-11 DIAGNOSIS — M25559 Pain in unspecified hip: Secondary | ICD-10-CM | POA: Diagnosis not present

## 2013-11-11 LAB — CBC WITH DIFFERENTIAL/PLATELET
Basophils Absolute: 0 10*3/uL (ref 0.0–0.1)
Eosinophils Absolute: 0.1 10*3/uL (ref 0.0–0.7)
Eosinophils Relative: 2 % (ref 0–5)
HCT: 39.4 % (ref 36.0–46.0)
Hemoglobin: 13.2 g/dL (ref 12.0–15.0)
Lymphs Abs: 1.6 10*3/uL (ref 0.7–4.0)
MCH: 30 pg (ref 26.0–34.0)
MCV: 89.5 fL (ref 78.0–100.0)
Monocytes Absolute: 0.4 10*3/uL (ref 0.1–1.0)
Monocytes Relative: 8 % (ref 3–12)
Neutrophils Relative %: 57 % (ref 43–77)
RBC: 4.4 MIL/uL (ref 3.87–5.11)
RDW: 12.8 % (ref 11.5–15.5)

## 2013-11-11 LAB — COMPLETE METABOLIC PANEL WITH GFR
Albumin: 4.5 g/dL (ref 3.5–5.2)
BUN: 13 mg/dL (ref 6–23)
CO2: 23 mEq/L (ref 19–32)
Creat: 0.88 mg/dL (ref 0.50–1.10)
GFR, Est African American: >89 mL/min
GFR, Est Non African American: 87 mL/min
Glucose, Bld: 76 mg/dL (ref 70–99)
Potassium: 4.3 mEq/L (ref 3.5–5.3)
Sodium: 139 mEq/L (ref 135–145)
Total Bilirubin: 0.3 mg/dL (ref 0.3–1.2)
Total Protein: 6.9 g/dL (ref 6.0–8.3)
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
## 2013-11-12 LAB — SEDIMENTATION RATE: Sed Rate: 1 mm/hr (ref 0–22)

## 2013-11-13 LAB — ANTI-NUCLEAR AB-TITER (ANA TITER): ANA Titer 1: 1:80 {titer} — ABNORMAL HIGH

## 2013-11-13 LAB — ANA: Anti Nuclear Antibody(ANA): POSITIVE — AB

## 2013-11-13 NOTE — ED Provider Notes (Signed)
Medical screening examination/treatment/procedure(s) were performed by non-physician practitioner and as supervising physician I was immediately available for consultation/collaboration.  EKG Interpretation   None       Devoria Albe, MD, Armando Gang   Ward Givens, MD 11/13/13 [phone]

## 2013-11-16 ENCOUNTER — Encounter (HOSPITAL_COMMUNITY): Payer: Self-pay | Admitting: Psychiatry

## 2013-11-16 ENCOUNTER — Ambulatory Visit (INDEPENDENT_AMBULATORY_CARE_PROVIDER_SITE_OTHER): Payer: Medicare Other | Admitting: Psychiatry

## 2013-11-16 VITALS — BP 91/74 | HR 83 | Ht 65.24 in | Wt 126.4 lb

## 2013-11-16 DIAGNOSIS — F319 Bipolar disorder, unspecified: Secondary | ICD-10-CM | POA: Diagnosis not present

## 2013-11-16 DIAGNOSIS — F4310 Post-traumatic stress disorder, unspecified: Secondary | ICD-10-CM | POA: Diagnosis not present

## 2013-11-16 MED ORDER — BUPROPION HCL ER (XL) 150 MG PO TB24: 150.0000 mg | ORAL_TABLET | ORAL | Status: DC

## 2013-11-16 MED ORDER — ESCITALOPRAM OXALATE 20 MG PO TABS: ORAL_TABLET | ORAL | Status: DC

## 2013-11-16 MED ORDER — RISPERIDONE 2 MG PO TABS: ORAL_TABLET | ORAL | Status: DC

## 2013-11-16 MED ORDER — LORAZEPAM 1 MG PO TABS: 1.0000 mg | ORAL_TABLET | Freq: Two times a day (BID) | ORAL | Status: DC

## 2013-11-16 MED ORDER — TOPIRAMATE 100 MG PO TABS: 100.0000 mg | ORAL_TABLET | Freq: Three times a day (TID) | ORAL | Status: DC
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
## 2013-11-17 ENCOUNTER — Telehealth: Payer: Self-pay | Admitting: Family Medicine

## 2013-11-17 DIAGNOSIS — R768 Other specified abnormal immunological findings in serum: Secondary | ICD-10-CM

## 2013-11-17 NOTE — Telephone Encounter (Signed)
Message copied by Donne Anon on Tue Nov 17, 2013 12:09 PM ------      Message from: Allayne Butcher      Created: Mon Nov 16, 2013  4:27 PM       She was seen at office visit with multiple different symptoms which included recent onset of pain in all of her finger joints.      Tell her that one lab is abnormal and we need to do some followup labs to further assess.      Reassure her that approximately 10% of the NORMAL population has this test to be positive even though they have no medical condition. Tell her that the only way to know whether she is one of that 10% normal versus whether this is a truely positive test is to do further labs.            Please place FUTURE order for :      LAB 147      LAB 648      LAB [id number]      LAB 2001 ------

## 2013-11-17 NOTE — Telephone Encounter (Signed)
Pt called back. Told about lab results.  Is going to come Friday for further lab results.

## 2013-11-18 ENCOUNTER — Other Ambulatory Visit: Payer: Medicare Other

## 2013-11-18 ENCOUNTER — Telehealth: Payer: Self-pay | Admitting: Family Medicine

## 2013-11-18 DIAGNOSIS — R768 Other specified abnormal immunological findings in serum: Secondary | ICD-10-CM

## 2013-11-18 DIAGNOSIS — R894 Abnormal immunological findings in specimens from other organs, systems and tissues: Secondary | ICD-10-CM | POA: Diagnosis not present

## 2013-11-18 MED ORDER — NAPROXEN SODIUM 220 MG PO TABS: 220.0000 mg | ORAL_TABLET | Freq: Two times a day (BID) | ORAL | Status: DC

## 2013-11-18 NOTE — Telephone Encounter (Signed)
Pt is wanting to know if she can have some Vicodin to help with her pain in her hands because she has to take a test today and it will help with the pain Call back number [phone]

## 2013-11-18 NOTE — Telephone Encounter (Signed)
I told her NO.  I refilled her Anaprox and told her to use that.  She came in for the other lab work and told her we will call when we get those results.  Reinforced she need to use the Anaprox for her hand pain.  Not giving her Vicodin.

## 2013-11-19 LAB — C3 AND C4
C3 Complement: 83 mg/dL — ABNORMAL LOW (ref 90–180)
C4 Complement: 20 mg/dL (ref 10–40)

## 2013-11-19 LAB — ANTI-DNA ANTIBODY, DOUBLE-STRANDED: ds DNA Ab: 4 IU/mL (ref <30)

## 2013-11-19 LAB — ANTI-NUCLEAR AB-TITER (ANA TITER)

## 2013-11-19 LAB — ANTI-SMITH ANTIBODY: ENA SM Ab Ser-aCnc: 2 AU/mL (ref <30)

## 2013-11-19 LAB — ANA: Anti Nuclear Antibody(ANA): POSITIVE — AB

## 2013-11-20 ENCOUNTER — Other Ambulatory Visit: Payer: Self-pay

## 2013-11-20 ENCOUNTER — Telehealth: Payer: Self-pay | Admitting: Family Medicine

## 2013-11-20 DIAGNOSIS — M79642 Pain in left hand: Secondary | ICD-10-CM

## 2013-11-20 DIAGNOSIS — R768 Other specified abnormal immunological findings in serum: Secondary | ICD-10-CM

## 2013-11-20 DIAGNOSIS — M79641 Pain in right hand: Secondary | ICD-10-CM

## 2013-11-20 NOTE — Telephone Encounter (Signed)
Message copied by Donne Anon on Fri Nov 20, 2013  3:35 PM ------      Message from: Allayne Butcher      Created: Fri Nov 20, 2013  2:41 PM       Patient was seen with office visit with complaints including severe pain in all the joints of both of her hands--  a recent new onset .      Tell Patient that the labs are not giving me any confirmation of any specific diagnosis.      Tell her that I recommend that she followup with rheumatologist. See if she wants to see Dr. Corliss Skains.      Please make a referral. ------

## 2013-11-23 ENCOUNTER — Telehealth: Payer: Self-pay | Admitting: Family Medicine

## 2013-11-23 NOTE — Telephone Encounter (Signed)
PT has called several times asking for you to tell her, her test results Call back number is  [phone]

## 2013-11-23 NOTE — Telephone Encounter (Signed)
Pt aware of lab results and Rheumatology referral has been initiated

## 2013-11-24 ENCOUNTER — Ambulatory Visit (INDEPENDENT_AMBULATORY_CARE_PROVIDER_SITE_OTHER): Payer: Medicare Other | Admitting: Psychiatry

## 2013-11-24 DIAGNOSIS — F319 Bipolar disorder, unspecified: Secondary | ICD-10-CM | POA: Diagnosis not present

## 2013-11-24 DIAGNOSIS — F4310 Post-traumatic stress disorder, unspecified: Secondary | ICD-10-CM | POA: Diagnosis not present

## 2013-11-24 NOTE — Progress Notes (Signed)
   THERAPIST PROGRESS NOTE  Session Time: 1:30-2:20 pm  Participation Level: Minimal  Behavioral Response: CasualConfusedAnxious and Depressed  Type of Therapy: Individual Therapy  Treatment Goals addressed: Diagnosis: Depression and Anxiety  Interventions: CBT and Solution Focused  Summary: Regina Steele is a 33 y.o. female who presents with moderate depressed and anxious mood. Pt presented with limited engagement in the session and required encouragement to talk during the session.  Pt said her sleep is a concern and described nights where she can't sleep and has severe nightmares and other nights where she oversleeps which causes her to get up late and it interferes with her ability to get the children to school. Pt said she is doing well without her husband in the home both financially and with the children. Pt said she is scheduled for a mediation appointment this month, but that she has received full custody of the children. Pt said she became so overwhelmed at the last court hearing that she became suicidal. Pt said she was able to enact her crisis plan and contact her family for support to help her become stabilized. Pt denies thoughts of suicide currently, but said she still struggles with severe mood shifts and constant anxiety. Pt agreed to contact her lawyer to discuss the mediation court hearing for further discussion at the next session to help Pt plan more effectively. Pt presented with limited socialization skills and struggled with paranoid thinking.   Suicidal/Homicidal: Nowithout intent/plan  Therapist Response: Assessed overall level of functioning per Pt self report, worked on goals of depression and anxiety by exploring triggers and reviewing crisis safety plan. Used solution focused therapy to explore how to prepare for her next court hearing to reduce depression symptoms.   Plan: Return again in 1 weeks.  Diagnosis: Axis I: Bipolar Disorder and PTSD    Axis II: No  diagnosis    Alante Tolan E, LCSW 11/24/2013

## 2013-11-25 ENCOUNTER — Ambulatory Visit (INDEPENDENT_AMBULATORY_CARE_PROVIDER_SITE_OTHER): Payer: Medicare Other | Admitting: Psychiatry

## 2013-11-25 ENCOUNTER — Encounter (HOSPITAL_COMMUNITY): Payer: Self-pay | Admitting: Psychiatry

## 2013-11-25 VITALS — BP 122/68 | HR 60 | Ht 65.24 in | Wt 130.2 lb

## 2013-11-25 DIAGNOSIS — F319 Bipolar disorder, unspecified: Secondary | ICD-10-CM

## 2013-11-25 DIAGNOSIS — F4310 Post-traumatic stress disorder, unspecified: Secondary | ICD-10-CM | POA: Diagnosis not present

## 2013-11-25 MED ORDER — ESCITALOPRAM OXALATE 20 MG PO TABS: ORAL_TABLET | ORAL | Status: DC

## 2013-11-25 MED ORDER — BUPROPION HCL ER (XL) 150 MG PO TB24: 150.0000 mg | ORAL_TABLET | ORAL | Status: DC

## 2013-11-25 MED ORDER — RISPERIDONE 2 MG PO TABS: ORAL_TABLET | ORAL | Status: DC

## 2013-11-25 MED ORDER — LORAZEPAM 1 MG PO TABS: ORAL_TABLET | ORAL | Status: DC

## 2013-11-25 MED ORDER — TOPIRAMATE 100 MG PO TABS: 100.0000 mg | ORAL_TABLET | Freq: Three times a day (TID) | ORAL | Status: DC
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
## 2013-11-26 ENCOUNTER — Ambulatory Visit (HOSPITAL_COMMUNITY): Payer: Self-pay | Admitting: Psychiatry

## 2013-11-30 ENCOUNTER — Telehealth (HOSPITAL_COMMUNITY): Payer: Self-pay | Admitting: *Deleted

## 2013-11-30 NOTE — Telephone Encounter (Signed)
Pt called left a message states she has been sleeping a lot, depressed, has a lot of anxiety. Sleeping 15 hours a day. Afraid to go grocery shopping or to the bank. Afraid of going into the hospital but also afraid that her child will need her and she will be alsleep. Asking what she should do and wanting [name] notified of her worsening condition.

## 2013-12-01 ENCOUNTER — Ambulatory Visit (INDEPENDENT_AMBULATORY_CARE_PROVIDER_SITE_OTHER): Payer: Medicare Other | Admitting: Psychiatry

## 2013-12-01 DIAGNOSIS — F319 Bipolar disorder, unspecified: Secondary | ICD-10-CM | POA: Diagnosis not present

## 2013-12-01 DIAGNOSIS — F4310 Post-traumatic stress disorder, unspecified: Secondary | ICD-10-CM

## 2013-12-01 NOTE — Progress Notes (Signed)
   THERAPIST PROGRESS NOTE  Session Time: 3:30-4:20 pm  Participation Level: Active  Behavioral Response: CasualAlertEuthymic  Type of Therapy: Individual Therapy  Treatment Goals addressed: Depression and Anxiety  Interventions: CBT and Solution Focused  Summary: Regina Steele is a 33 y.o. female who presents with elevated mood and affect. Pt reports no depression symptoms after making a medication change recently with the psychiatrist. Pt said she has energy and is able to attend to household tasks and her online school course as well as play with her children. Pt said she is still separated from her husband with their next court date scheduled for this Thursday, which is a video on mediation. Pt said she feel prepared for this hearing since it is only a video and she is not concerned about it. Pt expressed worries about her oldest daughter and the possibility of having ADHD after seeing it in her [name] chart at her pediatricians office. Pt said she would schedule an appointment with the doctor at [name] to get an evaluation. Pt said she felt prepared to transition to [name], the counselor she will be seeing in writers transition, and feels ready for this move. Pt will attend one more closure session with writer next week.   Suicidal/Homicidal: Nowithout intent/plan  Therapist Response: Assessed overall level of function and improvement in depression symptoms per pt self report. Worked on goal of depression by exploring symptoms and causes for their reduction, prepared patient to transfer to [name] for continued therapy and referred Pts daughter for a medication evaluation.   Plan: Return again in 1 weeks.  Diagnosis: Axis I: Bipolar Disorder Unspecified    Axis II: No diagnosis    Amarria Andreasen E, LCSW 12/01/2013

## 2013-12-03 ENCOUNTER — Telehealth (HOSPITAL_COMMUNITY): Payer: Self-pay | Admitting: *Deleted

## 2013-12-03 NOTE — Telephone Encounter (Signed)
Pt left TI:RWER'X Chief of Staff for 3/3.States she does not think she can be on a jury with her illness and wants to know if MD will write a letter to excuse her? Called pt: Advised pt to bring letter to appt on 12/23/13 and discuss with MD.

## 2013-12-05 ENCOUNTER — Other Ambulatory Visit (HOSPITAL_COMMUNITY): Payer: Self-pay | Admitting: Psychiatry

## 2013-12-07 ENCOUNTER — Other Ambulatory Visit (HOSPITAL_COMMUNITY): Payer: Self-pay | Admitting: Psychiatry

## 2013-12-07 NOTE — Telephone Encounter (Signed)
It was discontinued because of side effects

## 2013-12-08 ENCOUNTER — Ambulatory Visit (INDEPENDENT_AMBULATORY_CARE_PROVIDER_SITE_OTHER): Payer: Medicare Other | Admitting: Psychiatry

## 2013-12-08 DIAGNOSIS — F319 Bipolar disorder, unspecified: Secondary | ICD-10-CM | POA: Diagnosis not present
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
## 2013-12-14 DIAGNOSIS — G43009 Migraine without aura, not intractable, without status migrainosus: Secondary | ICD-10-CM | POA: Diagnosis not present

## 2013-12-15 ENCOUNTER — Ambulatory Visit (HOSPITAL_COMMUNITY): Payer: Self-pay | Admitting: Psychiatry

## 2013-12-16 DIAGNOSIS — F0630 Mood disorder due to known physiological condition, unspecified: Secondary | ICD-10-CM | POA: Diagnosis not present

## 2013-12-16 DIAGNOSIS — F909 Attention-deficit hyperactivity disorder, unspecified type: Secondary | ICD-10-CM | POA: Diagnosis not present

## 2013-12-17 ENCOUNTER — Ambulatory Visit (HOSPITAL_COMMUNITY): Payer: Self-pay | Admitting: Psychology

## 2013-12-18 ENCOUNTER — Encounter (HOSPITAL_COMMUNITY): Payer: Self-pay | Admitting: Psychology

## 2013-12-18 ENCOUNTER — Ambulatory Visit (INDEPENDENT_AMBULATORY_CARE_PROVIDER_SITE_OTHER): Payer: Medicare Other | Admitting: Psychology

## 2013-12-18 DIAGNOSIS — F4310 Post-traumatic stress disorder, unspecified: Secondary | ICD-10-CM

## 2013-12-18 DIAGNOSIS — F319 Bipolar disorder, unspecified: Secondary | ICD-10-CM | POA: Diagnosis not present
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
## 2013-12-21 ENCOUNTER — Telehealth: Payer: Self-pay | Admitting: Family Medicine

## 2013-12-21 DIAGNOSIS — F509 Eating disorder, unspecified: Secondary | ICD-10-CM

## 2013-12-21 NOTE — Telephone Encounter (Signed)
Problem with eating disorder and wants referral to nutritionist.  Was just seen in December.

## 2013-12-21 NOTE — Telephone Encounter (Signed)
Does She think she would benefit more from talking to a therapist/counselor? If she really thinks that nutritionist is what she needs, then I approve it, but I think a psychologist may be more beneficial.

## 2013-12-22 ENCOUNTER — Ambulatory Visit (HOSPITAL_COMMUNITY): Payer: Self-pay | Admitting: Psychiatry

## 2013-12-23 ENCOUNTER — Encounter (HOSPITAL_COMMUNITY): Payer: Self-pay | Admitting: *Deleted

## 2013-12-23 ENCOUNTER — Encounter (HOSPITAL_COMMUNITY): Payer: Self-pay | Admitting: Psychiatry

## 2013-12-23 ENCOUNTER — Ambulatory Visit (INDEPENDENT_AMBULATORY_CARE_PROVIDER_SITE_OTHER): Payer: Medicare Other | Admitting: Psychiatry

## 2013-12-23 VITALS — BP 119/78 | HR 69 | Ht 65.25 in | Wt 123.0 lb

## 2013-12-23 DIAGNOSIS — F319 Bipolar disorder, unspecified: Secondary | ICD-10-CM

## 2013-12-23 DIAGNOSIS — F4310 Post-traumatic stress disorder, unspecified: Secondary | ICD-10-CM | POA: Diagnosis not present

## 2013-12-23 MED ORDER — TOPIRAMATE 100 MG PO TABS: 100.0000 mg | ORAL_TABLET | Freq: Three times a day (TID) | ORAL | Status: DC

## 2013-12-23 MED ORDER — BUPROPION HCL ER (XL) 150 MG PO TB24: 150.0000 mg | ORAL_TABLET | ORAL | Status: DC

## 2013-12-23 MED ORDER — RISPERIDONE 2 MG PO TABS: ORAL_TABLET | ORAL | Status: DC

## 2013-12-23 MED ORDER — LORAZEPAM 1 MG PO TABS: ORAL_TABLET | ORAL | Status: DC

## 2013-12-23 MED ORDER — ESCITALOPRAM OXALATE 20 MG PO TABS: ORAL_TABLET | ORAL | Status: DC
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
## 2013-12-29 ENCOUNTER — Ambulatory Visit (HOSPITAL_COMMUNITY): Payer: Self-pay | Admitting: Psychiatry

## 2014-01-04 ENCOUNTER — Encounter (HOSPITAL_COMMUNITY): Payer: Self-pay | Admitting: *Deleted

## 2014-01-04 ENCOUNTER — Inpatient Hospital Stay (HOSPITAL_COMMUNITY)
Admission: RE | Admit: 2014-01-04 | Discharge: 2014-01-11 | DRG: 885 | Disposition: A | Discharge status: Home / Self Care | Payer: Medicare Other | Attending: Psychiatry | Admitting: Psychiatry

## 2014-01-04 DIAGNOSIS — R45851 Suicidal ideations: Secondary | ICD-10-CM

## 2014-01-04 DIAGNOSIS — F319 Bipolar disorder, unspecified: Principal | ICD-10-CM | POA: Diagnosis present

## 2014-01-04 DIAGNOSIS — M810 Age-related osteoporosis without current pathological fracture: Secondary | ICD-10-CM | POA: Diagnosis present

## 2014-01-04 DIAGNOSIS — F411 Generalized anxiety disorder: Secondary | ICD-10-CM | POA: Diagnosis present

## 2014-01-04 DIAGNOSIS — F410 Panic disorder [episodic paroxysmal anxiety] without agoraphobia: Secondary | ICD-10-CM | POA: Diagnosis not present

## 2014-01-04 DIAGNOSIS — F341 Dysthymic disorder: Secondary | ICD-10-CM | POA: Diagnosis present

## 2014-01-04 DIAGNOSIS — F4310 Post-traumatic stress disorder, unspecified: Secondary | ICD-10-CM | POA: Diagnosis not present

## 2014-01-04 DIAGNOSIS — F3160 Bipolar disorder, current episode mixed, unspecified: Secondary | ICD-10-CM | POA: Diagnosis not present

## 2014-01-04 DIAGNOSIS — F509 Eating disorder, unspecified: Secondary | ICD-10-CM | POA: Diagnosis not present

## 2014-01-04 MED ORDER — RISPERIDONE 2 MG PO TABS
2.0000 mg | ORAL_TABLET | Freq: Every day | ORAL | Status: DC
  Administered 2014-01-04 – 2014-01-10 (×7): 2 mg via ORAL
  Filled 2014-01-04 (×2): qty 1
  Filled 2014-01-04: qty 3
  Filled 2014-01-04 (×9): qty 1

## 2014-01-04 MED ORDER — INFLUENZA VAC SPLIT QUAD 0.5 ML IM SUSP
0.5000 mL | INTRAMUSCULAR | Status: AC
  Administered 2014-01-05: 0.5 mL via INTRAMUSCULAR
  Filled 2014-01-04: qty 0.5

## 2014-01-04 MED ORDER — ESCITALOPRAM OXALATE 20 MG PO TABS
20.0000 mg | ORAL_TABLET | Freq: Every day | ORAL | Status: DC
  Administered 2014-01-05 – 2014-01-11 (×7): 20 mg via ORAL
  Filled 2014-01-04: qty 3
  Filled 2014-01-04: qty 1
  Filled 2014-01-04: qty 2
  Filled 2014-01-04 (×8): qty 1
  Filled 2014-01-04: qty 2

## 2014-01-04 MED ORDER — DOCUSATE SODIUM 100 MG PO CAPS
200.0000 mg | ORAL_CAPSULE | Freq: Two times a day (BID) | ORAL | Status: DC
  Administered 2014-01-04 – 2014-01-05 (×3): 200 mg via ORAL
  Filled 2014-01-04 (×8): qty 2

## 2014-01-04 MED ORDER — MAGNESIUM HYDROXIDE 400 MG/5ML PO SUSP
30.0000 mL | Freq: Every day | ORAL | Status: DC | PRN
  Administered 2014-01-06: 30 mL via ORAL

## 2014-01-04 MED ORDER — NAPROXEN SODIUM 220 MG PO TABS
220.0000 mg | ORAL_TABLET | Freq: Two times a day (BID) | ORAL | Status: DC
  Filled 2014-01-04 (×3): qty 1

## 2014-01-04 MED ORDER — LINACLOTIDE 145 MCG PO CAPS
145.0000 ug | ORAL_CAPSULE | Freq: Every day | ORAL | Status: DC
  Administered 2014-01-05: 145 ug via ORAL
  Filled 2014-01-04 (×2): qty 1

## 2014-01-04 MED ORDER — CHLORDIAZEPOXIDE HCL 25 MG PO CAPS
25.0000 mg | ORAL_CAPSULE | Freq: Four times a day (QID) | ORAL | Status: DC | PRN
  Administered 2014-01-05 – 2014-01-10 (×5): 25 mg via ORAL
  Filled 2014-01-04 (×5): qty 1

## 2014-01-04 MED ORDER — LORATADINE 10 MG PO TABS
10.0000 mg | ORAL_TABLET | Freq: Every day | ORAL | Status: DC
Start: 2014-01-05 — End: 2014-01-11
  Administered 2014-01-05 – 2014-01-11 (×7): 10 mg via ORAL
  Filled 2014-01-04 (×10): qty 1

## 2014-01-04 MED ORDER — TRAZODONE HCL 50 MG PO TABS
50.0000 mg | ORAL_TABLET | Freq: Every evening | ORAL | Status: DC | PRN
  Administered 2014-01-04 – 2014-01-10 (×8): 50 mg via ORAL
  Filled 2014-01-04 (×18): qty 1

## 2014-01-04 MED ORDER — BUPROPION HCL ER (XL) 150 MG PO TB24
150.0000 mg | ORAL_TABLET | ORAL | Status: DC
Start: 2014-01-05 — End: 2014-01-11
  Administered 2014-01-05 – 2014-01-11 (×7): 150 mg via ORAL
  Filled 2014-01-04 (×11): qty 1
  Filled 2014-01-04: qty 3

## 2014-01-04 MED ORDER — ESTRADIOL 2 MG PO TABS
2.0000 mg | ORAL_TABLET | Freq: Every day | ORAL | Status: DC
  Administered 2014-01-04 – 2014-01-10 (×7): 2 mg via ORAL
  Filled 2014-01-04: qty 1
  Filled 2014-01-04: qty 2
  Filled 2014-01-04 (×8): qty 1

## 2014-01-04 MED ORDER — IBANDRONATE SODIUM 150 MG PO TABS: 150.0000 mg | ORAL_TABLET | ORAL | Status: DC

## 2014-01-04 MED ORDER — FLUTICASONE PROPIONATE 50 MCG/ACT NA SUSP
2.0000 | Freq: Every day | NASAL | Status: DC
  Administered 2014-01-05 – 2014-01-11 (×7): 2 via NASAL
  Filled 2014-01-04: qty 16

## 2014-01-04 MED ORDER — ESTRADIOL 10 MCG VA TABS: 1.0000 | ORAL_TABLET | VAGINAL | Status: DC

## 2014-01-04 MED ORDER — ALUM & MAG HYDROXIDE-SIMETH 200-200-20 MG/5ML PO SUSP
30.0000 mL | ORAL | Status: DC | PRN
Start: 2014-01-04 — End: 2014-01-11

## 2014-01-04 MED ORDER — TOPIRAMATE 100 MG PO TABS
100.0000 mg | ORAL_TABLET | Freq: Three times a day (TID) | ORAL | Status: DC
  Administered 2014-01-05 – 2014-01-06 (×4): 100 mg via ORAL
  Filled 2014-01-04 (×8): qty 1

## 2014-01-04 MED ORDER — ACETAMINOPHEN 325 MG PO TABS: 650.0000 mg | ORAL_TABLET | Freq: Four times a day (QID) | ORAL | Status: DC | PRN

## 2014-01-04 NOTE — Tx Team (Signed)
Initial Interdisciplinary Treatment Plan  PATIENT STRENGTHS: (choose at least two) Ability for insight Average or above average intelligence Capable of independent living Communication skills General fund of knowledge Motivation for treatment/growth  PATIENT STRESSORS: Marital or family conflict   PROBLEM LIST: Problem List/Patient Goals Date to be addressed Date deferred Reason deferred Estimated date of resolution  anxiety 01/04/14     Self-esteem 01/04/14     Self-harm thoughts 01/04/14                                          DISCHARGE CRITERIA:  Improved stabilization in mood, thinking, and/or behavior Motivation to continue treatment in a less acute level of care Need for constant or close observation no longer present Verbal commitment to aftercare and medication compliance  PRELIMINARY DISCHARGE PLAN: Attend aftercare/continuing care group Participate in family therapy Return to previous living arrangement  PATIENT/FAMIILY INVOLVEMENT: This treatment plan has been presented to and reviewed with the patient, Regina Steele.  The patient and family have been given the opportunity to ask questions and make suggestions.  Hoover Browns 01/04/2014, 9:18 PM
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
## 2014-01-04 NOTE — Progress Notes (Signed)
Patient ID: Regina Steele, female   DOB: Aug 01, 1981, 33 y.o.   MRN: [id number] Pt admitted voluntarily to [facility].  Pt presented as a walk in.  Pt reports having increase in panic attacks to 3/days.  Pt was unable to identify recent stressor.  Pt reported having thoughts of cutting a few days ago but denied the thoughts were to hurt self but rather to release anxiety.  Pt denied SI, HI and AVH during admission.  Pt reports that she is separated from her husband and that a child custody mediation is scheduled for this Wednesday.  Member reports that she lives with her three children.  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  Pt reports binge eating but denies purging.  Fifteen minute checks initiated.  Pt safe on unit.

## 2014-01-05 ENCOUNTER — Encounter (HOSPITAL_COMMUNITY): Payer: Self-pay | Admitting: Family

## 2014-01-05 ENCOUNTER — Ambulatory Visit (HOSPITAL_COMMUNITY): Payer: Self-pay | Admitting: Psychiatry

## 2014-01-05 DIAGNOSIS — F319 Bipolar disorder, unspecified: Principal | ICD-10-CM

## 2014-01-05 DIAGNOSIS — F509 Eating disorder, unspecified: Secondary | ICD-10-CM

## 2014-01-05 DIAGNOSIS — F4310 Post-traumatic stress disorder, unspecified: Secondary | ICD-10-CM

## 2014-01-05 LAB — URINALYSIS, ROUTINE W REFLEX MICROSCOPIC
Bilirubin Urine: NEGATIVE
Glucose, UA: NEGATIVE mg/dL
HGB URINE DIPSTICK: NEGATIVE
KETONES UR: NEGATIVE mg/dL
LEUKOCYTES UA: NEGATIVE
Nitrite: NEGATIVE
Protein, ur: NEGATIVE mg/dL
Specific Gravity, Urine: 1.002 — ABNORMAL LOW (ref 1.005–1.030)
Urobilinogen, UA: 0.2 mg/dL (ref 0.0–1.0)
pH: 7 (ref 5.0–8.0)

## 2014-01-05 LAB — HEPATIC FUNCTION PANEL
ALT: 10 U/L (ref 0–35)
AST: 17 U/L (ref 0–37)
Albumin: 4.2 g/dL (ref 3.5–5.2)
Alkaline Phosphatase: 29 U/L — ABNORMAL LOW (ref 39–117)
Bilirubin, Direct: <0.2 mg/dL (ref 0.0–0.3)
Total Bilirubin: 0.2 mg/dL — ABNORMAL LOW (ref 0.3–1.2)
Total Protein: 6.8 g/dL (ref 6.0–8.3)

## 2014-01-05 LAB — CBC
HEMATOCRIT: 37.1 % (ref 36.0–46.0)
HEMOGLOBIN: 12.3 g/dL (ref 12.0–15.0)
MCH: 29.7 pg (ref 26.0–34.0)
MCHC: 33.2 g/dL (ref 30.0–36.0)
MCV: 89.6 fL (ref 78.0–100.0)
Platelets: 267 10*3/uL (ref 150–400)
RBC: 4.14 MIL/uL (ref 3.87–5.11)
RDW: 12.4 % (ref 11.5–15.5)
WBC: 3.9 10*3/uL — ABNORMAL LOW (ref 4.0–10.5)

## 2014-01-05 LAB — COMPREHENSIVE METABOLIC PANEL WITH GFR
ALT: 11 U/L (ref 0–35)
AST: 17 U/L (ref 0–37)
Albumin: 4.3 g/dL (ref 3.5–5.2)
Alkaline Phosphatase: 30 U/L — ABNORMAL LOW (ref 39–117)
BUN: 12 mg/dL (ref 6–23)
CO2: 22 meq/L (ref 19–32)
Calcium: 9.1 mg/dL (ref 8.4–10.5)
Chloride: 98 meq/L (ref 96–112)
Creatinine, Ser: 0.94 mg/dL (ref 0.50–1.10)
GFR calc Af Amer: >90 mL/min
GFR calc non Af Amer: 79 mL/min — ABNORMAL LOW
Glucose, Bld: 89 mg/dL (ref 70–99)
Potassium: 4.1 meq/L (ref 3.7–5.3)
Sodium: 133 meq/L — ABNORMAL LOW (ref 137–147)
Total Bilirubin: 0.2 mg/dL — ABNORMAL LOW (ref 0.3–1.2)
Total Protein: 7.1 g/dL (ref 6.0–8.3)

## 2014-01-05 LAB — RAPID URINE DRUG SCREEN, HOSP PERFORMED
Amphetamines: NOT DETECTED
Barbiturates: NOT DETECTED
Benzodiazepines: NOT DETECTED
Cocaine: NOT DETECTED
Opiates: NOT DETECTED
Tetrahydrocannabinol: NOT DETECTED

## 2014-01-05 LAB — LIPID PANEL
Cholesterol: 187 mg/dL (ref 0–200)
HDL: 84 mg/dL
LDL Cholesterol: 83 mg/dL (ref 0–99)
Total CHOL/HDL Ratio: 2.2 ratio
Triglycerides: 102 mg/dL
VLDL: 20 mg/dL (ref 0–40)

## 2014-01-05 LAB — HEMOGLOBIN A1C
Hgb A1c MFr Bld: 5.5 %
Mean Plasma Glucose: 111 mg/dL

## 2014-01-05 LAB — TSH: TSH: 2.957 u[IU]/mL (ref 0.350–4.500)

## 2014-01-05 LAB — PREGNANCY, URINE: Preg Test, Ur: NEGATIVE

## 2014-01-05 LAB — MAGNESIUM: MAGNESIUM: 2 mg/dL (ref 1.5–2.5)

## 2014-01-05 MED ORDER — NAPROXEN 250 MG PO TABS
250.0000 mg | ORAL_TABLET | Freq: Two times a day (BID) | ORAL | Status: DC
  Administered 2014-01-05 – 2014-01-11 (×13): 250 mg via ORAL
  Filled 2014-01-05 (×18): qty 1

## 2014-01-05 MED ORDER — LINACLOTIDE 145 MCG PO CAPS
145.0000 ug | ORAL_CAPSULE | Freq: Every day | ORAL | Status: DC
  Administered 2014-01-06: 145 ug via ORAL
  Filled 2014-01-05 (×2): qty 1

## 2014-01-05 MED ORDER — LAMOTRIGINE 25 MG PO TABS
25.0000 mg | ORAL_TABLET | Freq: Two times a day (BID) | ORAL | Status: DC
  Administered 2014-01-05 – 2014-01-11 (×12): 25 mg via ORAL
  Filled 2014-01-05 (×3): qty 1
  Filled 2014-01-05: qty 6
  Filled 2014-01-05 (×5): qty 1
  Filled 2014-01-05: qty 6
  Filled 2014-01-05 (×8): qty 1
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
## 2014-01-05 NOTE — B[facility] Suicide Risk Assessment (Signed)
[facility] Adult Inpatient Family/Significant Other Suicide Prevention Education  Suicide Prevention Education:   Patient Refusal for Family/Significant Other Suicide Prevention Education: The patient has refused to provide written consent for family/significant other to be provided Family/Significant Other Suicide Prevention Education during admission and/or prior to discharge.  Physician notified.  [name] provided suicide prevention information with patient.    The suicide prevention education provided includes the following:  Suicide risk factors  Suicide prevention and interventions  National Suicide Hotline telephone number  [facility] assessment telephone number  [facility] Emergency Assistance 911  [facility] and/or Residential Mobile Crisis Unit telephone number   Reyes Ivan, [place] 01/05/2014 11:12 AM
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
## 2014-01-05 NOTE — BHH Group Notes (Signed)
Adult Psychoeducational Group Note  Date:  01/05/2014 Time:  0900am  Group Topic/Focus:  Orientation:   The focus of this group is to educate the patient on the purpose and policies of crisis stabilization and provide a format to answer questions about their admission.  The group details unit policies and expectations of patients while admitted.  Participation Level:  Did Not Attend  Regina Steele 01/05/2014, 1:08 PM

## 2014-01-05 NOTE — Progress Notes (Signed)
Patient ID: Regina Steele, female   DOB: June 20, 1981, 33 y.o.   MRN: [id number] PER STATE REGULATIONS 482.30  THIS CHART WAS REVIEWED FOR MEDICAL NECESSITY WITH RESPECT TO THE PATIENT'S ADMISSION/ DURATION OF STAY.  NEXT REVIEW DATE: 01/08/2014  Willa Rough, RN, BSN CASE MANAGER

## 2014-01-05 NOTE — Progress Notes (Signed)
D: Pt she is very sleepy and just feels like sleeping. Pt stated she did not sleep well last night. Denies si/hi/avh. Denies pain. Pt is minimal upon contact. Poor eye contact. Pt is cooperative.  A: scheduled medications given. q15 min safety checks R: pt remains safe on unit. No complaints at this time

## 2014-01-05 NOTE — Progress Notes (Signed)
Patient ID: Regina Steele, female   DOB: [birth year], 32 y.o.   MRN: [id number] D: Pt. Lying in bed, but up to get medications. A: Writer introduced self to client reviewed medications and administered as prescribed. Staff will monitor q86min for safety. R: Pt. Is safe on the unit.
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
## 2014-01-05 NOTE — [facility] Group Notes (Signed)
[facility] LCSW Group Therapy  01/05/2014  1:15 PM   Type of Therapy:  Group Therapy  Participation Level:  Active  Participation Quality:  Appropriate, Attentive, Sharing and Supportive  Affect:  Depressed and Flat  Cognitive:  Alert and Oriented  Insight:  Developing/Improving and Engaged  Engagement in Therapy:  Developing/Improving and Engaged  Modes of Intervention:  Activity, Clarification, Confrontation, Discussion, Education, Exploration, Limit-setting, Orientation, Problem-solving, Rapport Building, Dance movement psychotherapist, Socialization and Support  Summary of Progress/Problems: Patient was attentive and engaged with speaker from [employer].  Patient was attentive to speaker while they shared their story of dealing with mental health and overcoming it.  Patient expressed interest in their programs and services and received information on their agency.  Patient processed ways they can relate to the speaker.     Reyes Ivan, LCSW 01/05/2014 1:53 PM

## 2014-01-06 DIAGNOSIS — F1910 Other psychoactive substance abuse, uncomplicated: Secondary | ICD-10-CM

## 2014-01-06 DIAGNOSIS — R45851 Suicidal ideations: Secondary | ICD-10-CM

## 2014-01-06 DIAGNOSIS — F3130 Bipolar disorder, current episode depressed, mild or moderate severity, unspecified: Secondary | ICD-10-CM

## 2014-01-06 MED ORDER — TOPIRAMATE 25 MG PO TABS
75.0000 mg | ORAL_TABLET | Freq: Three times a day (TID) | ORAL | Status: DC
  Administered 2014-01-06 – 2014-01-08 (×6): 75 mg via ORAL
  Filled 2014-01-06 (×9): qty 3

## 2014-01-06 MED ORDER — LINACLOTIDE 290 MCG PO CAPS
290.0000 ug | ORAL_CAPSULE | Freq: Every day | ORAL | Status: DC
  Filled 2014-01-06: qty 1

## 2014-01-06 MED ORDER — LINACLOTIDE 290 MCG PO CAPS
290.0000 ug | ORAL_CAPSULE | Freq: Every day | ORAL | Status: DC
  Administered 2014-01-06 – 2014-01-11 (×6): 290 ug via ORAL
  Filled 2014-01-06 (×9): qty 1

## 2014-01-06 MED ORDER — BISACODYL 10 MG RE SUPP
10.0000 mg | Freq: Once | RECTAL | Status: AC
  Administered 2014-01-06: 10 mg via RECTAL
  Filled 2014-01-06: qty 1

## 2014-01-06 NOTE — [facility] Group Notes (Signed)
[facility] LCSW Aftercare Discharge Planning Group Note   01/06/2014 8:45 AM  Participation Quality:  Alert, Appropriate and Oriented  Mood/Affect:  Blunted, Depressed  Depression Rating:  0  Anxiety Rating:  0  Thoughts of Suicide:  Pt denies SI/HI  Will you contract for safety?   Yes  Current AVH:  Pt denies  Plan for Discharge/Comments:  Pt attended discharge planning group and actively participated in group.  [name] provided pt with today's workbook.  Pt reports feeling well today.  Pt states that she plans to socialize more, take her meds as prescribed and be more realistic with expectations for herself.  Pt will return home in [place] and will follow up at [facility] and [facility] for medication management and therapy.  No further needs voiced by pt at this time.    Transportation Means: Pt reports access to transportation  Supports: No supports mentioned at this time  Reyes Ivan, LCSW 01/06/2014 9:59 AM

## 2014-01-06 NOTE — Progress Notes (Signed)
Patient ID: Regina Steele, female   DOB: 03/06/1981, 33 y.o.   MRN: [id number]  D: Pt. Denies SI/HI and A/V Hallucinations. Patient rates her hopelessness at 1/10 for the day. Patient does not report any pain but does report that she has not had a bowel movement since Sunday. Patient is concerned about her bowels at this point in time. Patient refused milk of magnesia and stated, "call my GI doctor about it."  MD and NP notified by writer of patient's complaint.   A: Support and encouragement provided to the patient. Scheduled medications given to patient per physician's orders.  R: Patient is receptive and cooperative but does appear anxious. Patient is seen in the milieu and is attending groups. Q15 minute checks are maintained for safety.

## 2014-01-06 NOTE — Progress Notes (Signed)
Recreation Therapy Notes  Date: 02.24.2015 Time: 2:45pm Location: [address] Dayroom   Group Topic: Communication, Team Building, Problem Solving  Goal Area(s) Addresses:  Patient will effectively work with peer towards shared goal.  Patient will identify skill used to make activity successful.  Patient will identify how skills used during activity can be used to reach post d/c goals.   Behavioral Response: Did not attend.   Marykay Lex Rutherford Alarie, LRT/CTRS        Azaiah Licciardi L 01/06/2014 1:23 PM

## 2014-01-06 NOTE — Tx Team (Signed)
Interdisciplinary Treatment Plan Update (Adult)  Date: 01/06/2014  Time Reviewed:  9:45 AM  Progress in Treatment: Attending groups: Yes Participating in groups:  Yes Taking medication as prescribed:  Yes Tolerating medication:  Yes Family/Significant othe contact made: [name] assessing  Patient understands diagnosis:  Yes Discussing patient identified problems/goals with staff:  Yes Medical problems stabilized or resolved:  Yes Denies suicidal/homicidal ideation: Yes Issues/concerns per patient self-inventory:  Yes Other:  New problem(s) identified: N/A  Discharge Plan or Barriers: [name] assessing for appropriate referrals.  Reason for Continuation of Hospitalization: Anxiety Depression Medication Stabilization  Comments: N/A  Estimated length of stay: 3-5 days  For review of initial/current patient goals, please see plan of care.  Attendees: Patient:     Family:     Physician:  Dr. Johnalagadda 01/06/2014 10:27 AM   Nursing:   Ronecia Byrd, RN 01/06/2014 10:27 AM   Clinical Social Worker:  Kru Allman Horton, LCSW 01/06/2014 10:27 AM   Other: Conrad Withrow, PA 01/06/2014 10:27 AM   Other:  Valerie Noch, care coordination 01/06/2014 10:27 AM   Other:  Quylle Hodnett, LCSW 01/06/2014 10:27 AM   Other:  Christa Dopson, RN 01/06/2014 10:27 AM   Other:    Other:    Other:    Other:    Other:      Scribe for Treatment Team:   Horton, Zariah Cavendish Nicole, 01/06/2014 , 10:27 AM   

## 2014-01-06 NOTE — Progress Notes (Signed)
[facility] MD Progress Note  01/06/2014 8:52 AM Regina Steele  MRN:  [id number] Subjective:  Patient was seen and chart reviewed. Patient has been diagnosed with bipolar disorder, posttraumatic stress disorder, generalized anxiety disorder, chronic constipation and has multiple acute psychiatric hospitalizations for suicidal ideations and [place] [facility] and also [facility]. Patient has been following up with psychiatric outpatient care services at [facility]. Patient reported she was admitted for increased symptoms of anxiety episodes toward the weekend without triggers.  Patient has presented with increased anxiety, lack of sleep, repetitive thoughts, obsession about killing herself, suicidal ideation with plan to cut her wrist. She endorsed binge eating and then regret after the eating. She is under a lot of stress because of the ongoing court battle for custody. She has restlessness, worsening of dreams, nightmares and flashback,social isolation, withdrawn,racing thoughts, feeling of hopelessness and worthlessness. She's compliant with Risperdal, Topamax, Lexapro and Ativan. She's keep repeating her own words.  Diagnosis:   DSM5: Schizophrenia Disorders:   Obsessive-Compulsive Disorders:   Trauma-Stressor Disorders:   Substance/Addictive Disorders:   Depressive Disorders:   Total Time spent with patient: 30 minutes  Axis I: Bipolar, Depressed, Post Traumatic Stress Disorder, Substance Abuse and eating disorder not otherwise specified  ADL's:  Impaired  Sleep: Poor  Appetite:  Fair  Suicidal Ideation:  Patient endorses suicidal ideation but contracts for safety while in the hospital. Homicidal Ideation:  denied AEB (as evidenced by):  Psychiatric Specialty Exam: Physical Exam  ROS  Blood pressure 100/76, pulse 80, temperature 98.4 F (36.9 C), temperature source Oral, resp. rate 18, height 5' 3"  (1.6 m), weight  60.782 kg (134 lb), last menstrual period 12/14/2003.Body mass index is 23.74 kg/(m^2).  General Appearance: Bizarre and Guarded  Eye Contact::  Fair  Speech:  Clear and Coherent, Pressured and repeating herself several times  Volume:  Normal  Mood:  Anxious, Depressed, Hopeless and Worthless  Affect:  Labile  Thought Process:  Circumstantial, Disorganized and Tangential  Orientation:  Full (Time, Place, and Person)  Thought Content:  WDL  Suicidal Thoughts:  Yes.  with intent/plan  Homicidal Thoughts:  No  Memory:  Immediate;   Fair  Judgement:  Impaired  Insight:  Lacking  Psychomotor Activity:  Restlessness  Concentration:  Fair  Recall:  [place]: Fair  Akathisia:  NA  Handed:  Right  AIMS (if indicated):     Assets:  Communication Skills Desire for Improvement Financial Resources/Insurance Housing Physical Health Resilience Social Support  Sleep:  Number of Hours: 6.75   Musculoskeletal: Strength & Muscle Tone: within normal limits Gait & Station: normal Patient leans: N/A  Current Medications: Current Facility-Administered Medications  Medication Dose Route Frequency Provider Last Rate Last Dose  . acetaminophen (TYLENOL) tablet 650 mg  650 mg Oral Q6H PRN Laverle Hobby, PA-C      . alum & mag hydroxide-simeth (MAALOX/MYLANTA) 200-200-20 MG/5ML suspension 30 mL  30 mL Oral Q4H PRN Laverle Hobby, PA-C      . buPROPion (WELLBUTRIN XL) 24 hr tablet 150 mg  150 mg Oral BH-q7a Spencer E Simon, PA-C   150 mg at 01/06/14 5170  . chlordiazePOXIDE (LIBRIUM) capsule 25 mg  25 mg Oral QID PRN Laverle Hobby, PA-C   25 mg at 01/05/14 0409  . docusate sodium (COLACE) capsule 200 mg  200 mg Oral BID Laverle Hobby, PA-C   200 mg at  01/05/14 1704  . escitalopram (LEXAPRO) tablet 20 mg  20 mg Oral Daily Laverle Hobby, PA-C   20 mg at 01/05/14 3875  . estradiol (ESTRACE) tablet 2 mg  2 mg Oral QHS Laverle Hobby, PA-C   2 mg at 01/05/14 2120   . [START ON 01/07/2014] Estradiol vaginal tablet 10 mcg  1 tablet Vaginal Once per day on Mon Thu Spencer E Simon, PA-C      . fluticasone [facility]) 50 MCG/ACT nasal spray 2 spray  2 spray Each Nare Daily Laverle Hobby, PA-C   2 spray at 01/05/14 6433  . lamoTRIgine (LAMICTAL) tablet 25 mg  25 mg Oral BID Kathlee Nations, MD   25 mg at 01/05/14 1704  . Linaclotide (LINZESS) capsule 145 mcg  145 mcg Oral Q0600 Durward Parcel, MD   145 mcg at 01/06/14 0647  . loratadine (CLARITIN) tablet 10 mg  10 mg Oral Daily Laverle Hobby, PA-C   10 mg at 01/05/14 2951  . magnesium hydroxide (MILK OF MAGNESIA) suspension 30 mL  30 mL Oral Daily PRN Laverle Hobby, PA-C      . naproxen (NAPROSYN) tablet 250 mg  250 mg Oral BID WC Durward Parcel, MD   250 mg at 01/05/14 1704  . risperiDONE (RISPERDAL) tablet 2 mg  2 mg Oral QHS Laverle Hobby, PA-C   2 mg at 01/05/14 2121  . topiramate (TOPAMAX) tablet 75 mg  75 mg Oral TID PC Durward Parcel, MD      . traZODone (DESYREL) tablet 50 mg  50 mg Oral QHS,MR X 1 Laverle Hobby, PA-C   50 mg at 01/05/14 2121    Lab Results:  Results for orders placed during the hospital encounter of 01/04/14 (from the past 48 hour(s))  URINE RAPID DRUG SCREEN (HOSP PERFORMED)     Status: None   Collection Time    01/05/14  5:30 AM      Result Value Ref Range   Opiates NONE DETECTED  NONE DETECTED   Cocaine NONE DETECTED  NONE DETECTED   Benzodiazepines NONE DETECTED  NONE DETECTED   Amphetamines NONE DETECTED  NONE DETECTED   Tetrahydrocannabinol NONE DETECTED  NONE DETECTED   Barbiturates NONE DETECTED  NONE DETECTED   Comment:            DRUG SCREEN FOR MEDICAL PURPOSES     ONLY.  IF CONFIRMATION IS NEEDED     FOR ANY PURPOSE, NOTIFY LAB     WITHIN 5 DAYS.                LOWEST DETECTABLE LIMITS     FOR URINE DRUG SCREEN     Drug Class       Cutoff (ng/mL)     Amphetamine      1000     Barbiturate      200     Benzodiazepine   884      Tricyclics       166     Opiates          300     Cocaine          300     THC              50     Performed at [place], URINE     Status: None   Collection Time    01/05/14  5:30 AM  Result Value Ref Range   Preg Test, Ur NEGATIVE  NEGATIVE   Comment:            THE SENSITIVITY OF THIS     METHODOLOGY IS >20 mIU/mL.     Performed at [place], ROUTINE W REFLEX MICROSCOPIC     Status: Abnormal   Collection Time    01/05/14  5:30 AM      Result Value Ref Range   Color, Urine YELLOW  YELLOW   APPearance CLEAR  CLEAR   Specific Gravity, Urine 1.002 (*) 1.005 - 1.030   pH 7.0  5.0 - 8.0   Glucose, UA NEGATIVE  NEGATIVE mg/dL   Hgb urine dipstick NEGATIVE  NEGATIVE   Bilirubin Urine NEGATIVE  NEGATIVE   Ketones, ur NEGATIVE  NEGATIVE mg/dL   Protein, ur NEGATIVE  NEGATIVE mg/dL   Urobilinogen, UA 0.2  0.0 - 1.0 mg/dL   Nitrite NEGATIVE  NEGATIVE   Leukocytes, UA NEGATIVE  NEGATIVE   Comment: MICROSCOPIC NOT DONE ON URINES WITH NEGATIVE PROTEIN, BLOOD, LEUKOCYTES, NITRITE, OR GLUCOSE <1000 mg/dL.     Performed at [facility]  CBC     Status: Abnormal   Collection Time    01/05/14  6:15 AM      Result Value Ref Range   WBC 3.9 (*) 4.0 - 10.5 K/uL   RBC 4.14  3.87 - 5.11 MIL/uL   Hemoglobin 12.3  12.0 - 15.0 g/dL   HCT 37.1  36.0 - 46.0 %   MCV 89.6  78.0 - 100.0 fL   MCH 29.7  26.0 - 34.0 pg   MCHC 33.2  30.0 - 36.0 g/dL   RDW 12.4  11.5 - 15.5 %   Platelets 267  150 - 400 K/uL   Comment: Performed at [place] PANEL     Status: Abnormal   Collection Time    01/05/14  6:15 AM      Result Value Ref Range   Sodium 133 (*) 137 - 147 mEq/L   Potassium 4.1  3.7 - 5.3 mEq/L   Chloride 98  96 - 112 mEq/L   CO2 22  19 - 32 mEq/L   Glucose, Bld 89  70 - 99 mg/dL   BUN 12  6 - 23 mg/dL   Creatinine, Ser 0.94  0.50 - 1.10 mg/dL   Calcium  9.1  8.4 - 10.5 mg/dL   Total Protein 7.1  6.0 - 8.3 g/dL   Albumin 4.3  3.5 - 5.2 g/dL   AST 17  0 - 37 U/L   ALT 11  0 - 35 U/L   Alkaline Phosphatase 30 (*) 39 - 117 U/L   Total Bilirubin 0.2 (*) 0.3 - 1.2 mg/dL   GFR calc non Af Amer 79 (*) >90 mL/min   GFR calc Af Amer >90  >90 mL/min   Comment: (NOTE)     The eGFR has been calculated using the CKD EPI equation.     This calculation has not been validated in all clinical situations.     eGFR's persistently <90 mL/min signify possible Chronic Kidney     Disease.     Performed at [place] A1C     Status: None   Collection Time    01/05/14  6:15 AM      Result Value Ref Range   Hemoglobin A1C 5.5  <5.7 %  Comment: (NOTE)                                                                               According to the ADA Clinical Practice Recommendations for 2011, when     HbA1c is used as a screening test:      >=6.5%   Diagnostic of Diabetes Mellitus               (if abnormal result is confirmed)     5.7-6.4%   Increased risk of developing Diabetes Mellitus     References:Diagnosis and Classification of Diabetes Mellitus,Diabetes     NATF,5732,20(URKYH 1):S62-S69 and Standards of Medical Care in             Diabetes - 2011,Diabetes CWCB,7628,31 (Suppl 1):S11-S61.   Mean Plasma Glucose 111  <117 mg/dL   Comment: Performed at [place]     Status: None   Collection Time    01/05/14  6:15 AM      Result Value Ref Range   Magnesium 2.0  1.5 - 2.5 mg/dL   Comment: Performed at [facility]  LIPID PANEL     Status: None   Collection Time    01/05/14  6:15 AM      Result Value Ref Range   Cholesterol 187  0 - 200 mg/dL   Triglycerides 102  <150 mg/dL   HDL 84  >39 mg/dL   Total CHOL/HDL Ratio 2.2     VLDL 20  0 - 40 mg/dL   LDL Cholesterol 83  0 - 99 mg/dL   Comment:            Total Cholesterol/HDL:CHD Risk     Coronary Heart Disease Risk Table                          Men   Women      1/2 Average Risk   3.4   3.3      Average Risk       5.0   4.4      2 X Average Risk   9.6   7.1      3 X Average Risk  23.4   11.0                Use the calculated Patient Ratio     above and the CHD Risk Table     to determine the patient's CHD Risk.                ATP III CLASSIFICATION (LDL):      <100     mg/dL   Optimal      100-129  mg/dL   Near or Above                        Optimal      130-159  mg/dL   Borderline      160-189  mg/dL   High      >190     mg/dL   Very High     Performed at [place]  FUNCTION PANEL     Status: Abnormal   Collection Time    01/05/14  6:15 AM      Result Value Ref Range   Total Protein 6.8  6.0 - 8.3 g/dL   Albumin 4.2  3.5 - 5.2 g/dL   AST 17  0 - 37 U/L   ALT 10  0 - 35 U/L   Alkaline Phosphatase 29 (*) 39 - 117 U/L   Total Bilirubin 0.2 (*) 0.3 - 1.2 mg/dL   Bilirubin, Direct <0.2  0.0 - 0.3 mg/dL   Indirect Bilirubin NOT CALCULATED  0.3 - 0.9 mg/dL   Comment: Performed at [facility]  TSH     Status: None   Collection Time    01/05/14  6:15 AM      Result Value Ref Range   TSH 2.957  0.350 - 4.500 uIU/mL   Comment: Performed at [facility]    Physical Findings: AIMS: Facial and Oral Movements Muscles of Facial Expression: None, normal Lips and Perioral Area: None, normal Jaw: None, normal Tongue: None, normal,Extremity Movements Upper (arms, wrists, hands, fingers): None, normal Lower (legs, knees, ankles, toes): None, normal, Trunk Movements Neck, shoulders, hips: None, normal, Overall Severity Severity of abnormal movements (highest score from questions above): None, normal Incapacitation due to abnormal movements: None, normal Patient's awareness of abnormal movements (rate only patient's report): No Awareness, Dental Status Current problems with teeth and/or dentures?: No Does patient usually wear dentures?: No  CIWA:    COWS:      Treatment Plan Summary: Daily contact with patient to assess and evaluate symptoms and progress in treatment Medication management  Plan: Treatment Plan/Recommendations:   1. Admit for crisis management and stabilization. 2. Medication management to reduce current symptoms to base line and improve the patient's overall level of functioning. Change Topamax 75 mg TID as planned with Dr. Adele Schilder Continue Wellbutrin XLon 50 mg daily, Lexapro20 mg daily, Lamictal 25 mg twice daily, Risperdal 2 mg at bedtime and continue trazodone 50 mg at bedtime for sleep 3. Treat health problems as indicated. 4. Develop treatment plan to decrease risk of relapse upon discharge and to reduce the need for readmission. 5. Psycho-social education regarding relapse prevention and self care. 6. Health care follow up as needed for medical problems. 7. Restart home medications where appropriate.   Medical Decision Making Problem Points:  Established problem, worsening (2), New problem, with no additional work-up planned (3), Review of last therapy session (1) and Review of psycho-social stressors (1) Data Points:  Review or order clinical lab tests (1) Review or order medicine tests (1) Review of medication regiment & side effects (2) Review of new medications or change in dosage (2)  I certify that inpatient services furnished can reasonably be expected to improve the patient's condition.   Bryten Maher,JANARDHAHA R. 01/06/2014, 8:52 AM

## 2014-01-06 NOTE — [facility] Group Notes (Signed)
[facility] LCSW Group Therapy  01/06/2014  1:15 PM   Type of Therapy:  Group Therapy  Participation Level:  Active  Participation Quality:  Attentive, Sharing and Supportive  Affect:  Depressed and Flat  Cognitive:  Alert and Oriented  Insight:  Developing/Improving and Engaged  Engagement in Therapy:  Developing/Improving and Engaged  Modes of Intervention:  Clarification, Confrontation, Discussion, Education, Exploration, Limit-setting, Orientation, Problem-solving, Rapport Building, Dance movement psychotherapist, Socialization and Support  Summary of Progress/Problems: The topic for group today was emotional regulation.  This group focused on both positive and negative emotion identification and allowed group members to process ways to identify feelings, regulate negative emotions, and find healthy ways to manage internal/external emotions. Group members were asked to reflect on a time when their reaction to an emotion led to a negative outcome and explored how alternative responses using emotion regulation would have benefited them. Group members were also asked to discuss a time when emotion regulation was utilized when a negative emotion was experienced.  Pt shared that she has realized that she has anxiety and panic attacks because she is always trying to be perfect, which is an unrealistic expectation for herself.  Pt states that she thinks that if she is perfect, bad things won't happen to her, which logically pt knows isn't true.  Pt states that she plans to work on this expectation within herself to reduce her anxiety.  Pt was insightful, actively participated and engaged in group discussion.    Reyes Ivan, LCSW 01/06/2014 2:19 PM

## 2014-01-06 NOTE — Progress Notes (Signed)
Patient ID: Regina Steele, female   DOB: [birth year], 33 y.o.   MRN: [id number] D: Pt. Reports "I'm not anxious, I'm not depressed" "I had an eventful day, I've enjoyed meals, people been inviting me to sit with them, I ate lunch with my roommate today" "I have just got to lear to focus on my physical and mental health, myself and my children, then everybody else" "set realistic goals" Pt. Reports no relief from MOM as of yet. Writer noted   Abdominal  Distention, soft to touch, bowel sounds active. Staff will monitor q50min for safety. R: Pt. Is safe on the unit. Pt. Attended group.

## 2014-01-06 NOTE — Progress Notes (Signed)
[facility] Group Notes:  (Nursing/MHT/Case Management/Adjunct)  Date:  01/06/2014  Time:  12:15 AM  Type of Therapy:  Group Therapy  Participation Level:  Active  Participation Quality:  Appropriate  Affect:  Appropriate  Cognitive:  Appropriate  Insight:  Good  Engagement in Group:  Engaged  Modes of Intervention:  Socialization and Support  Summary of Progress/Problems: Pt. Had good insight on the group topic of recovery.  Pt. Stated she was worried about the side effects of medicine (weight gain).  Pt. Was encouraged to stay active and exercise.  Pt. Encouraged to speak with MD and nurse about medication. Sondra Come 01/06/2014, 12:15 AM

## 2014-01-06 NOTE — [facility] Group Notes (Signed)
Adult Psychoeducational Group Note  Date:  01/06/2014 Time:  8:38 PM  Group Topic/Focus:  Wrap-Up Group:   The focus of this group is to help patients review their daily goal of treatment and discuss progress on daily workbooks.  Participation Level:  Active  Participation Quality:  Appropriate  Affect:  Flat  Cognitive:  Appropriate  Insight: Appropriate  Engagement in Group:  Limited  Modes of Intervention:  Discussion  Additional Comments:  Laurita stated her day was good.  She had good conversations with people, played volleyball, enjoyed laughing and talking with people and feels like her medications are working.  She also expressed that she likes to exercise and read.  Caroll Rancher A 01/06/2014, 8:38 PM

## 2014-01-06 NOTE — Progress Notes (Signed)
Adult Psychoeducational Group Note  Date:  01/06/2014 Time: 10:00am Group Topic/Focus:  Personal Choices and Values:   The focus of this group is to help patients assess and explore the importance of values in their lives, how their values affect their decisions, how they express their values and what opposes their expression.  Participation Level:  Active  Participation Quality:  Appropriate and Attentive  Affect:  Appropriate  Cognitive:  Alert and Appropriate  Insight: Appropriate  Engagement in Group:  Engaged  Modes of Intervention:  Discussion and Education  Additional Comments:  Pt attended and participated in group. Discussion was on personal development. Pt was asked what does personal development mean to you? Pt stated personal development means overcoming problems.  Shelly Bombard D 01/06/2014, 2:02 PM

## 2014-01-06 NOTE — Progress Notes (Signed)
Recreation Therapy Notes  Date: 02.25.2015  Time: 2:45pm Location: [address] Dayroom   Group Topic: Understanding mental health   Goal Area(s) Addresses:  Patient will identify benefits to understanding mental health. Patient will work effectively with teammates.   Behavioral Response: Did not attend.    Marykay Lex Beanca Kiester, LRT/CTRS  Jearl Klinefelter 01/06/2014 8:44 PM

## 2014-01-07 MED ORDER — ESTRADIOL 10 MCG VA TABS: 1.0000 | ORAL_TABLET | VAGINAL | Status: DC

## 2014-01-07 NOTE — Progress Notes (Signed)
Adult Psychoeducational Group Note  Date:  01/07/2014 Time:  8:30 PM  Group Topic/Focus:  Wrap-Up Group:   The focus of this group is to help patients review their daily goal of treatment and discuss progress on daily workbooks.  Participation Level:  Minimal  Participation Quality:  Appropriate and Attentive  Affect:  Appropriate  Cognitive:  Alert, Appropriate and Oriented  Insight: Appropriate and Good  Engagement in Group:  Engaged  Modes of Intervention:  Discussion and Support  Additional Comments:  Pt shared that in her leisure time she enjoys reading and anything that keeps her "mind active and busy".   Humberto Seals Monique 01/07/2014, 10:39 PM

## 2014-01-07 NOTE — Progress Notes (Signed)
Patient ID: Regina Steele, female   DOB: 05/20/1981, 33 y.o.   MRN: [id number] [facility] MD Progress Note  01/07/2014 5:17 PM Regina Steele  MRN:  [id number] Subjective:  Patient was seen and chart reviewed. Patient has been diagnosed with bipolar disorder, posttraumatic stress disorder, generalized anxiety disorder, chronic constipation and has multiple acute psychiatric hospitalizations for [place] ideations [place] [facility] and also in [facility]. Patient has been following up with psychiatric outpatient care services at [facility]. Patient reported she was admitted for increased symptoms of anxiety episodes toward the weekend without triggers. Patient has presented with increased anxiety, lack of sleep, repetitive thoughts, obsession about killing herself, suicidal ideation with plan to cut her wrist. She endorsed binge eating and then regret after the eating. She is under a lot of stress because of the ongoing court battle for custody. She has restlessness, worsening of dreams, nightmares and flashback,social isolation, withdrawn,racing thoughts, feeling of hopelessness and worthlessness. She's compliant with Risperdal, Topamax, Lexapro and Ativan. She's keep repeating her own words.  During today's assessment, pt is minimizing anxiety and depression, stating that both are at a 0, but pt is visibly very anxious and seems depressed. Pt states that the group sessions are helping, but pt is avoiding eye contact nearly completely and made eye contact on 2 occasions during the assessment. Pt is talking, while facing the floor rather than engaging directly. Pt denies SI, HI, and AVH, contracts for safety, but pt's reports may be inaccurate based on inconsistencies between presentation and verbalization.   Diagnosis:   DSM5: Schizophrenia Disorders:   Obsessive-Compulsive Disorders:   Trauma-Stressor Disorders:   Substance/Addictive  Disorders:   Depressive Disorders:   Total Time spent with patient: 30 minutes  Axis I: Bipolar, Depressed, Post Traumatic Stress Disorder, Substance Abuse and eating disorder not otherwise specified  ADL's:  Impaired  Sleep: Good  Appetite:  Good  Suicidal Ideation:  Denies Homicidal Ideation:  Denies AEB (as evidenced by):  Psychiatric Specialty Exam: Physical Exam  ROS  Blood pressure 106/71, pulse 80, temperature 97.8 F (36.6 C), temperature source Oral, resp. rate 16, height 5\' 3"  (1.6 m), weight 60.782 kg (134 lb), last menstrual period 12/14/2003.Body mass index is 23.74 kg/(m^2).  General Appearance: Bizarre and Guarded  Eye Contact::  Fair  Speech:  Clear and Coherent  Volume:  Normal  Mood:  Anxious and Depressed  Affect:  Labile  Thought Process:  Coherent  Orientation:  Full (Time, Place, and Person)  Thought Content:  WDL  Suicidal Thoughts:  No  Homicidal Thoughts:  No  Memory:  Immediate;   Fair  Judgement:  Impaired  Insight:  Lacking  Psychomotor Activity:  Restlessness  Concentration:  Fair  Recall:  [phone] of Knowledge:Fair  Language: Fair  Akathisia:  NA  Handed:  Right  AIMS (if indicated):     Assets:  Communication Skills Desire for Improvement Financial Resources/Insurance Housing Physical Health Resilience Social Support  Sleep:  Number of Hours: 6.75   Musculoskeletal: Strength & Muscle Tone: within normal limits Gait & Station: normal Patient leans: N/A  Current Medications: Current Facility-Administered Medications  Medication Dose Route Frequency Provider Last Rate Last Dose  . acetaminophen (TYLENOL) tablet 650 mg  650 mg Oral Q6H PRN [employer], PA-C      . alum & mag hydroxide-simeth (MAALOX/MYLANTA) 200-200-20 MG/5ML suspension 30 mL  30 mL Oral Q4H PRN 05-21-2001, PA-C      .  buPROPion (WELLBUTRIN XL) 24 hr tablet 150 mg  150 mg Oral BH-q7a Spencer E Simon, PA-C   150 mg at 01/07/14 3151  .  chlordiazePOXIDE (LIBRIUM) capsule 25 mg  25 mg Oral QID PRN Kerry Hough, PA-C   25 mg at 01/06/14 2152  . escitalopram (LEXAPRO) tablet 20 mg  20 mg Oral Daily Kerry Hough, PA-C   20 mg at 01/07/14 7616  . estradiol (ESTRACE) tablet 2 mg  2 mg Oral QHS Kerry Hough, PA-C   2 mg at 01/06/14 2149  . Estradiol vaginal tablet 10 mcg  1 tablet Vaginal Once per day on Mon Thu Janardhaha R Rochell Mabie, MD      . fluticasone [facility]) 50 MCG/ACT nasal spray 2 spray  2 spray Each Nare Daily Kerry Hough, PA-C   2 spray at 01/07/14 0737  . lamoTRIgine (LAMICTAL) tablet 25 mg  25 mg Oral BID Cleotis Nipper, MD   25 mg at 01/07/14 1638  . Linaclotide (LINZESS) capsule 290 mcg  290 mcg Oral Q0600 Nehemiah Settle, MD   290 mcg at 01/07/14 [phone]  . loratadine (CLARITIN) tablet 10 mg  10 mg Oral Daily Kerry Hough, PA-C   10 mg at 01/07/14 6948  . magnesium hydroxide (MILK OF MAGNESIA) suspension 30 mL  30 mL Oral Daily PRN Kerry Hough, PA-C   30 mL at 01/06/14 1826  . naproxen (NAPROSYN) tablet 250 mg  250 mg Oral BID WC Nehemiah Settle, MD   250 mg at 01/07/14 1638  . risperiDONE (RISPERDAL) tablet 2 mg  2 mg Oral QHS Kerry Hough, PA-C   2 mg at 01/06/14 2149  . topiramate (TOPAMAX) tablet 75 mg  75 mg Oral TID PC Nehemiah Settle, MD   75 mg at 01/07/14 1302  . traZODone (DESYREL) tablet 50 mg  50 mg Oral QHS,MR X 1 Kerry Hough, PA-C   50 mg at 01/06/14 2149    Lab Results:  No results found for this or any previous visit (from the past 48 hour(s)).  Physical Findings: AIMS: Facial and Oral Movements Muscles of Facial Expression: None, normal Lips and Perioral Area: None, normal Jaw: None, normal Tongue: None, normal,Extremity Movements Upper (arms, wrists, hands, fingers): None, normal Lower (legs, knees, ankles, toes): None, normal, Trunk Movements Neck, shoulders, hips: None, normal, Overall Severity Severity of abnormal movements (highest  score from questions above): None, normal Incapacitation due to abnormal movements: None, normal Patient's awareness of abnormal movements (rate only patient's report): No Awareness, Dental Status Current problems with teeth and/or dentures?: No Does patient usually wear dentures?: No  CIWA:    COWS:     Treatment Plan Summary: Daily contact with patient to assess and evaluate symptoms and progress in treatment Medication management  Plan: Treatment Plan/Recommendations:  1. Admit for crisis management and stabilization. 2. Medication management to reduce current symptoms to base line and improve the patient's overall level of functioning.  -Change Topamax 75 mg TID as planned with [name] -Continue Wellbutrin XLon 50 mg daily, Lexapro20 mg daily, Lamictal 25 mg twice daily, Risperdal 2 mg at bedtime and -Continue trazodone 50 mg at bedtime for sleep  3. Treat health problems as indicated. 4. Develop treatment plan to decrease risk of relapse upon discharge and to reduce the need for readmission. 5. Psycho-social education regarding relapse prevention and self care. 6. Health care follow up as needed for medical problems. 7. Restart home medications where  appropriate.   Medical Decision Making Problem Points:  Established problem, worsening (2), New problem, with no additional work-up planned (3), Review of last therapy session (1) and Review of psycho-social stressors (1) Data Points:  Review or order clinical lab tests (1) Review or order medicine tests (1) Review of medication regiment & side effects (2) Review of new medications or change in dosage (2)  I certify that inpatient services furnished can reasonably be expected to improve the patient's condition.   Beau Fanny, FNP-BC 01/07/2014, 5:17 PM  Reviewed the information documented and agree with the treatment plan.  Hashem Goynes,JANARDHAHA R. 01/08/2014 8:59 AM

## 2014-01-07 NOTE — Progress Notes (Signed)
Recreation Therapy Notes  Date: 02.26.2015 Time: 2:45pm Location: [address] Dayroom   Group Topic: Leisure Education  Goal Area(s) Addresses:  Patient will identify positive leisure activities.  Patient will identify positive emotions associated with leisure participation.  Patient will identify one positive benefit of participation in leisure activities.   Behavioral Response: Did not attend.   Marykay Lex Marks Scalera, LRT/CTRS  Felton Buczynski L 01/07/2014 4:09 PM

## 2014-01-07 NOTE — Progress Notes (Signed)
Patient ID: Regina Steele, female   DOB: 1981-09-19, 33 y.o.   MRN: [id number]  Morning Wellness Group 9:00 A.M.  The focus of this group is to educate the patient on the purpose and policies of crisis stabilization and provide a format to answer questions about their admission.  The group details unit policies and expectations of patients while admitted.  Patient attended group and was attentive during. Patient reports that she likes reading for her leisure activity. She was smiling during group.

## 2014-01-07 NOTE — Progress Notes (Signed)
Patient ID: Regina Steele, female   DOB: August 04, 1981, 33 y.o.   MRN: [id number] D:Pt. Reports no anxiety or depression. Pt. Reports going to group "getting along with people " "I just have to put my recovery first" "I was putting myself last" A: Writer encouraged pt. To report concerns, Staff will monitor q49min for safety. R: Pt. Is safe on the unit. Pt. Attended group.

## 2014-01-07 NOTE — Progress Notes (Signed)
Patient ID: Regina Steele, female   DOB: [birth year], 33 y.o.   MRN: [id number]  D: Pt. Denies SI/HI and A/V Hallucinations. Patient rates her depression and hopelessness at 1/10 for the day. Patient does not report any pain or discomfort at this time. Patient's abdomen is slightly distended but non-tender. Patient reports that she had a bowel movement last night and is not experiencing discomfort that she was yesterday.  A: Support and encouragement provided to the patient. Scheduled medications given to patient per physician's orders.  R: Patient is receptive and cooperative. Patient is seen in the milieu and is going to groups. Q15 minute checks are maintained for safety.

## 2014-01-07 NOTE — [facility] Group Notes (Signed)
[facility] LCSW Group Therapy  01/07/2014  1:15 PM   Type of Therapy:  Group Therapy  Participation Level:  Active  Participation Quality:  Attentive, Sharing and Supportive  Affect:  Depressed and Flat  Cognitive:  Alert and Oriented  Insight:  Developing/Improving and Engaged  Engagement in Therapy:  Developing/Improving and Engaged  Modes of Intervention:  Clarification, Confrontation, Discussion, Education, Exploration, Limit-setting, Orientation, Problem-solving, Rapport Building, Dance movement psychotherapist, Socialization and Support  Summary of Progress/Problems: The topic for group was balance in life.  Today's group focused on defining balance in one's own words, identifying things that can knock one off balance, and exploring healthy ways to maintain balance in life. Group members were asked to provide an example of a time when they felt off balance, describe how they handled that situation,and process healthier ways to regain balance in the future. Group members were asked to share the most important tool for maintaining balance that they learned while at [facility] and how they plan to apply this method after discharge.  Pt shared that her life was no balanced because she put too much emphasis on trying to be perfect and had unrealistic expectations.  Pt states that she plans to focus on her health and recovery.  Pt was able to process how she plans to set boundaries with those that caused her to have these unrealistic expectations for herself and focus on herself.  Pt actively participated and was engaged in group discussion.    Reyes Ivan, LCSW 01/07/2014 2:23 PM

## 2014-01-08 MED ORDER — TOPIRAMATE 25 MG PO TABS
75.0000 mg | ORAL_TABLET | Freq: Two times a day (BID) | ORAL | Status: DC
  Administered 2014-01-08 – 2014-01-11 (×6): 75 mg via ORAL
  Filled 2014-01-08: qty 3
  Filled 2014-01-08: qty 18
  Filled 2014-01-08 (×6): qty 3
  Filled 2014-01-08: qty 18
  Filled 2014-01-08 (×3): qty 3

## 2014-01-08 NOTE — Progress Notes (Signed)
D: Patient denies SI/HI and A/V hallucinations; patient reports sleep is well; reports appetite is good; reports energy level is normal ; reports ability to pay attention is good; rates depression as 1/10; rates hopelessness 1/10;  A: Monitored q 15 minutes; patient encouraged to attend groups; patient educated about medications; patient given medications per physician orders; patient encouraged to express feelings and/or concerns  R: Patient is upset about not being discharged; patient is appropriate to circumstances; patient is pleasant; patient  patient's interaction with staff and peers is appropriate; patient was able to set goal to talk with staff 1:1 when having feelings of SI; patient is taking medications as prescribed and tolerating medications; patient is attending all groups and engaging

## 2014-01-08 NOTE — Progress Notes (Signed)
[facility] Group Notes:  (Nursing/MHT/Case Management/Adjunct)  Date:  01/08/2014  Time:  8:00 p.m.   Type of Therapy:  Psychoeducational Skills  Participation Level:  Active  Participation Quality:  Appropriate  Affect:  Appropriate  Cognitive:  Appropriate  Insight:  Good  Engagement in Group:  Developing/Improving  Modes of Intervention:  Education  Summary of Progress/Problems: The patient described her day as having been good overall. She attributed her good day to laughing and being more "open" with her peers. She also states that she was more "honest with people" today. As a theme for the day, her relapse prevention will include focusing on her recovery with her health.   Regina Steele S 01/08/2014, 9:24 PM

## 2014-01-08 NOTE — Progress Notes (Signed)
Patient signed 72 discharge 01/08/14 at 1106 hrs. Physician Jonnalagadda made aware at 1110 hrs

## 2014-01-08 NOTE — Progress Notes (Signed)
Patient ID: Regina Steele, female   DOB: Feb 25, 1981, 33 y.o.   MRN: [id number] PER STATE REGULATIONS 482.30  THIS CHART WAS REVIEWED FOR MEDICAL NECESSITY WITH RESPECT TO THE PATIENT'S ADMISSION/ DURATION OF STAY.  NEXT REVIEW DATE:  01/12/2014  Willa Rough, RN, BSN CASE MANAGER

## 2014-01-08 NOTE — Tx Team (Signed)
Interdisciplinary Treatment Plan Update (Adult)  Date: 01/08/2014  Time Reviewed:  9:45 AM  Progress in Treatment: Attending groups: Yes Participating in groups:  Yes Taking medication as prescribed:  Yes Tolerating medication:  Yes Family/Significant othe contact made: No, pt refused Patient understands diagnosis:  Yes Discussing patient identified problems/goals with staff:  Yes Medical problems stabilized or resolved:  Yes Denies suicidal/homicidal ideation: Yes Issues/concerns per patient self-inventory:  Yes Other:  New problem(s) identified: N/A  Discharge Plan or Barriers: Pt has follow up scheduled [facility] and [place] for medication management and therapy.    Reason for Continuation of Hospitalization: Anxiety Depression Medication Stabilization  Comments: N/A  Estimated length of stay: 2-3 days  For review of initial/current patient goals, please see plan of care.  Attendees: Patient:     Family:     Physician:  Dr. Javier Glazier 01/08/2014 10:02 AM   Nursing:   Dr. Leighton Parody, RN 01/08/2014 10:02 AM   Clinical Social Worker:  Reyes Ivan, LCSW 01/08/2014 10:02 AM   Other:  Seabron Spates, RN 01/08/2014 10:02 AM   Other:  Onnie Boer, case manager 01/08/2014 10:02 AM   Other:  Juline Patch, LCSW 01/08/2014 10:02 AM   Other:     Other:    Other:    Other:    Other:    Other:      Scribe for Treatment Team:   Carmina Miller, 01/08/2014 , 10:02 AM

## 2014-01-08 NOTE — [facility] Group Notes (Signed)
[facility] LCSW Aftercare Discharge Planning Group Note   01/08/2014 8:45 AM  Participation Quality:  Alert, Appropriate and Oriented  Mood/Affect: Calm  Depression Rating:  0  Anxiety Rating:  0  Thoughts of Suicide:  Pt denies SI/HI  Will you contract for safety?   Yes  Current AVH:  Pt denies  Plan for Discharge/Comments:  Pt attended discharge planning group and actively participated in group.  [name] provided pt with today's workbook.  Pt reports feeling better today.  Pt states that she has realized a lot while being here.  Pt will return home in [place] and will follow up at [facility] and [place] for medication management and therapy.  No further needs voiced by pt at this time.    Transportation Means: Pt reports access to transportation  Supports: No supports mentioned at this time  Reyes Ivan, LCSW 01/08/2014 9:53 AM

## 2014-01-08 NOTE — Progress Notes (Signed)
Patient ID: Regina Steele, female   DOB: 01-20-1981, 33 y.o.   MRN: [id number] Patient ID: Regina Steele, female   DOB: September 12, 1981, 33 y.o.   MRN: [id number] [facility] MD Progress Note  01/08/2014 12:13 PM Regina Steele  MRN:  [id number] Subjective:  Patient has been diagnosed with bipolar disorder, posttraumatic stress disorder, generalized anxiety disorder, chronic constipation and has multiple acute psychiatric hospitalizations for suicidal ideations and [place] [facility] and also in [facility]. Patient has been following up with psychiatric outpatient care services at [facility]. Patient was admitted for increased symptoms of anxiety episodes on the weekend without triggers. Patient has presented with increased anxiety, lack of sleep, repetitive thoughts, obsession about killing herself, suicidal ideation with plan to cut her wrist. She endorsed binge eating and then regret after the eating. She is under a lot of stress because of the ongoing court battle for custody. She has restlessness, worsening of dreams, nightmares and flashback,social isolation, withdrawn,racing thoughts, feeling of hopelessness and worthlessness. She's compliant with Risperdal, Topamax, Lexapro and Ativan. She's keep repeating her own words.  Patient has been doing better today with that her hospitalized treatment including medication management and group treatment. Patient stated she went gym along with peers and able to participate without difficulties. Patient has been minimizing  her current symptoms off anxiety and depression.  Patient stating that both are at  low , but pt is visibly very anxious and seems depressed. Pt states that the group sessions are helping. Patient  denies SI, HI, and AVH, contracts for safety.   Diagnosis:   DSM5: Schizophrenia Disorders:   Obsessive-Compulsive Disorders:   Trauma-Stressor Disorders:   was supple.   Substance/Addictive Disorders:   Depressive Disorders:   Total Time spent with patient: 30 minutes  Axis I: Bipolar, Depressed, Post Traumatic Stress Disorder, Substance Abuse and eating disorder not otherwise specified  ADL's:  Impaired  Sleep: Good  Appetite:  Good  Suicidal Ideation:  Denies Homicidal Ideation:  Denies AEB (as evidenced by):  Psychiatric Specialty Exam: Physical Exam  ROS  Blood pressure 103/70, pulse 101, temperature 97.6 F (36.4 C), temperature source Oral, resp. rate 16, height 5\' 3"  (1.6 m), weight 60.782 kg (134 lb), last menstrual period 12/14/2003.Body mass index is 23.74 kg/(m^2).  General Appearance: Bizarre and Guarded  Eye Contact::  Fair  Speech:  Clear and Coherent  Volume:  Normal  Mood:  Anxious and Depressed  Affect:  Labile  Thought Process:  Coherent  Orientation:  Full (Time, Place, and Person)  Thought Content:  WDL  Suicidal Thoughts:  No  Homicidal Thoughts:  No  Memory:  Immediate;   Fair  Judgement:  Impaired  Insight:  Lacking  Psychomotor Activity:  Restlessness  Concentration:  Fair  Recall:  [id number] of Knowledge:Fair  Language: Fair  Akathisia:  NA  Handed:  Right  AIMS (if indicated):     Assets:  Communication Skills Desire for Improvement Financial Resources/Insurance Housing Physical Health Resilience Social Support  Sleep:  Number of Hours: 5.75   Musculoskeletal: Strength & Muscle Tone: within normal limits Gait & Station: normal Patient leans: N/A  Current Medications: Current Facility-Administered Medications  Medication Dose Route Frequency Provider Last Rate Last Dose  . acetaminophen (TYLENOL) tablet 650 mg  650 mg Oral Q6H PRN [employer], PA-C      . alum & mag hydroxide-simeth (MAALOX/MYLANTA) 200-200-20 MG/5ML suspension 30 mL  30 mL  Oral Q4H PRN Kerry Hough, PA-C      . buPROPion (WELLBUTRIN XL) 24 hr tablet 150 mg  150 mg Oral BH-q7a Spencer E Simon, PA-C   150 mg at 01/08/14  2130  . chlordiazePOXIDE (LIBRIUM) capsule 25 mg  25 mg Oral QID PRN Kerry Hough, PA-C   25 mg at 01/06/14 2152  . escitalopram (LEXAPRO) tablet 20 mg  20 mg Oral Daily Kerry Hough, PA-C   20 mg at 01/08/14 0820  . estradiol (ESTRACE) tablet 2 mg  2 mg Oral QHS Kerry Hough, PA-C   2 mg at 01/07/14 2139  . Estradiol vaginal tablet 10 mcg  1 tablet Vaginal Once per day on Mon Thu Janardhaha R Marshall Roehrich, MD      . fluticasone [facility]) 50 MCG/ACT nasal spray 2 spray  2 spray Each Nare Daily Kerry Hough, PA-C   2 spray at 01/08/14 0820  . lamoTRIgine (LAMICTAL) tablet 25 mg  25 mg Oral BID Cleotis Nipper, MD   25 mg at 01/08/14 8657  . Linaclotide (LINZESS) capsule 290 mcg  290 mcg Oral Q0600 Nehemiah Settle, MD   290 mcg at 01/08/14 [phone]  . loratadine (CLARITIN) tablet 10 mg  10 mg Oral Daily Kerry Hough, PA-C   10 mg at 01/08/14 0820  . magnesium hydroxide (MILK OF MAGNESIA) suspension 30 mL  30 mL Oral Daily PRN Kerry Hough, PA-C   30 mL at 01/06/14 1826  . naproxen (NAPROSYN) tablet 250 mg  250 mg Oral BID WC Nehemiah Settle, MD   250 mg at 01/08/14 0820  . risperiDONE (RISPERDAL) tablet 2 mg  2 mg Oral QHS Kerry Hough, PA-C   2 mg at 01/07/14 2140  . topiramate (TOPAMAX) tablet 75 mg  75 mg Oral BID Nehemiah Settle, MD      . traZODone (DESYREL) tablet 50 mg  50 mg Oral QHS,MR X 1 Kerry Hough, PA-C   50 mg at 01/07/14 2139    Lab Results:  No results found for this or any previous visit (from the past 48 hour(s)).  Physical Findings: AIMS: Facial and Oral Movements Muscles of Facial Expression: None, normal Lips and Perioral Area: None, normal Jaw: None, normal Tongue: None, normal,Extremity Movements Upper (arms, wrists, hands, fingers): None, normal Lower (legs, knees, ankles, toes): None, normal, Trunk Movements Neck, shoulders, hips: None, normal, Overall Severity Severity of abnormal movements (highest score from  questions above): None, normal Incapacitation due to abnormal movements: None, normal Patient's awareness of abnormal movements (rate only patient's report): No Awareness, Dental Status Current problems with teeth and/or dentures?: No Does patient usually wear dentures?: No  CIWA:    COWS:     Treatment Plan Summary: Daily contact with patient to assess and evaluate symptoms and progress in treatment Medication management  Plan: Treatment Plan/Recommendations:  1. Admit for crisis management and stabilization. 2. Medication management to reduce current symptoms to base line and improve the patient's overall level of functioning.  -Change Topamax 75 mg  twice a day  -Continue Wellbutrin XLon 50 mg daily, Lexapro20 mg daily, Lamictal 25 mg twice daily, Risperdal 2 mg at bedtime  Continue trazodone 50 mg at bedtime for sleep  3. Treat health problems as indicated. 4. Develop treatment plan to decrease risk of relapse upon discharge and to reduce the need for readmission. 5. Psycho-social education regarding relapse prevention and self care. 6. Health care follow up as needed  for medical problems. 7. Restart home medications where appropriate.   Medical Decision Making Problem Points:  Established problem, worsening (2), New problem, with no additional work-up planned (3), Review of last therapy session (1) and Review of psycho-social stressors (1) Data Points:  Review or order clinical lab tests (1) Review or order medicine tests (1) Review of medication regiment & side effects (2) Review of new medications or change in dosage (2)  I certify that inpatient services furnished can reasonably be expected to improve the patient's condition.    Lunette Tapp,JANARDHAHA R. 01/08/2014 12:13 PM

## 2014-01-08 NOTE — Progress Notes (Signed)
Recreation Therapy Notes  Date: 02.27.2015 Time: 2:45pm Location: [address] Dayroom    Group Topic: Communication, Team Building, Problem Solving  Goal Area(s) Addresses:  Patient will effectively work with peer towards shared goal.  Patient will identify skill used to make activity successful.  Patient will identify how skills used during activity can be used to build healthy support system.   Behavioral Response: Appropriate   Intervention: Problem Solving Activity  Activity: [employer]. Patients were provided the following materials: 5 drinking straws, 5 rubber bands, 5 paper clips, 2 index cards, 2 drinking cups, and 2 toilet paper rolls. Using the provided materials patients were asked to build a launching mechanisms to launch a ping pong ball approximately 12 feet. Patients were divided into Steele of 3-5.   Education: Pharmacist, community, Building control surveyor.   Education Outcome: Acknowledges understanding   Clinical Observations/Feedback: Patient actively engaged in group activity, working well with team mates and assisting with Regina Steele launching mechanism. Patient made no contributions to group discussion, but appeared to actively listen as she maintained appropriate eye contact with speaker.   Marykay Lex Staley Budzinski, LRT/CTRS  Regina Steele L 01/08/2014 4:18 PM

## 2014-01-08 NOTE — [facility] Group Notes (Signed)
[facility] LCSW Group Therapy  01/08/2014  1:15 PM   Type of Therapy:  Group Therapy  Participation Level:  Active  Participation Quality:  Attentive, Sharing and Supportive  Affect:  Depressed and Blunted  Cognitive:  Alert and Oriented  Insight:  Developing/Improving and Engaged  Engagement in Therapy:  Developing/Improving and Engaged  Modes of Intervention:  Clarification, Confrontation, Discussion, Education, Exploration, Limit-setting, Orientation, Problem-solving, Rapport Building, Dance movement psychotherapist, Socialization and Support  Summary of Progress/Problems: The topic for today was feelings about relapse.  Pt discussed what relapse prevention is to them and identified triggers that they are on the path to relapse.  Pt processed their feeling towards relapse and was able to relate to peers.  Pt discussed coping skills that can be used for relapse prevention.   Pt was initially acting bizarre, making comments such as her brother works in Automotive engineer and has a PhD.  When asked to explain what she was talking about pt states that it was best to stay quiet.  Pt later spoke about the group topic, sharing that she plans to continue using her coping skills such as being healthy and using her spirituality to prevent relapse.  Pt states that she is optimistic that she "has it this time" and is committed to her recovery now.  Pt actively participated and was engaged in group discussion.    Reyes Ivan, LCSW 01/08/2014 2:38 PM

## 2014-01-09 MED ORDER — MAGNESIUM CITRATE PO SOLN
1.0000 | Freq: Once | ORAL | Status: AC
  Administered 2014-01-10: 1 via ORAL

## 2014-01-09 NOTE — Progress Notes (Signed)
Patient ID: Regina Steele, female   DOB: 1981/01/23, 33 y.o.   MRN: [id number] [facility] MD Progress Note  01/09/2014 1:49 PM MACALL MCCROSKEY  MRN:  [id number] Subjective:  Patient looking down and denying suicidal ideations and anxiety, despite being overtly anxious.  Sitting rigidly straight on the edge of her chair with poor eye contact.  She is labile with her emotions, states her appetite and sleep are "good", forwards little information.  Diagnosis:   DSM5:  Total Time spent with patient: 30 minutes  Axis I: Bipolar, Depressed, Post Traumatic Stress Disorder, Substance Abuse and eating disorder not otherwise specified  ADL's:  Impaired  Sleep: Good  Appetite:  Good  Suicidal Ideation:  Denies Homicidal Ideation:  Denies AEB (as evidenced by):  Psychiatric Specialty Exam: Physical Exam  Review of Systems  Constitutional: Negative.   HENT: Negative.   Eyes: Negative.   Respiratory: Negative.   Cardiovascular: Negative.   Gastrointestinal: Negative.   Genitourinary: Negative.   Musculoskeletal: Negative.   Skin: Negative.   Neurological: Negative.   Endo/Heme/Allergies: Negative.   Psychiatric/Behavioral: Positive for depression. The patient is nervous/anxious.     Blood pressure 92/57, pulse 94, temperature 97.2 F (36.2 C), temperature source Oral, resp. rate 18, height 5\' 3"  (1.6 m), weight 60.782 kg (134 lb), last menstrual period 12/14/2003.Body mass index is 23.74 kg/(m^2).  General Appearance: Bizarre and Guarded  Eye Contact::  Fair  Speech:  Clear and Coherent  Volume:  Normal  Mood:  Anxious and Depressed  Affect:  Labile  Thought Process:  Coherent  Orientation:  Full (Time, Place, and Person)  Thought Content:  WDL  Suicidal Thoughts:  No  Homicidal Thoughts:  No  Memory:  Immediate;   Fair  Judgement:  Impaired  Insight:  Lacking  Psychomotor Activity:  Restlessness  Concentration:  Fair  Recall:  [id number] of Knowledge:Fair  Language: Fair   Akathisia:  NA  Handed:  Right  AIMS (if indicated):     Assets:  Communication Skills Desire for Improvement Financial Resources/Insurance Housing Physical Health Resilience Social Support  Sleep:  Number of Hours: 6.25   Musculoskeletal: Strength & Muscle Tone: within normal limits Gait & Station: normal Patient leans: N/A  Current Medications: Current Facility-Administered Medications  Medication Dose Route Frequency Provider Last Rate Last Dose  . acetaminophen (TYLENOL) tablet 650 mg  650 mg Oral Q6H PRN [employer], PA-C      . alum & mag hydroxide-simeth (MAALOX/MYLANTA) 200-200-20 MG/5ML suspension 30 mL  30 mL Oral Q4H PRN 05-21-2001, PA-C      . buPROPion (WELLBUTRIN XL) 24 hr tablet 150 mg  150 mg Oral BH-q7a Spencer E Simon, PA-C   150 mg at 01/09/14 01/11/14  . chlordiazePOXIDE (LIBRIUM) capsule 25 mg  25 mg Oral QID PRN 3500, PA-C   25 mg at 01/08/14 2217  . escitalopram (LEXAPRO) tablet 20 mg  20 mg Oral Daily 2218, PA-C   20 mg at 01/09/14 0820  . estradiol (ESTRACE) tablet 2 mg  2 mg Oral QHS 01/11/14, PA-C   2 mg at 01/08/14 2103  . Estradiol vaginal tablet 10 mcg  1 tablet Vaginal Once per day on Mon Thu Janardhaha R Jonnalagadda, MD      . fluticasone [facility]) 50 MCG/ACT nasal spray 2 spray  2 spray Each Nare Daily [facility], PA-C   2 spray at 01/09/14 0820  . lamoTRIgine (LAMICTAL) tablet 25  mg  25 mg Oral BID Cleotis Nipper, MD   25 mg at 01/09/14 0820  . Linaclotide (LINZESS) capsule 290 mcg  290 mcg Oral Q0600 Nehemiah Settle, MD   290 mcg at 01/09/14 0604  . loratadine (CLARITIN) tablet 10 mg  10 mg Oral Daily Kerry Hough, PA-C   10 mg at 01/09/14 1660  . magnesium citrate solution 1 Bottle  1 Bottle Oral Once Nanine Means, NP      . magnesium hydroxide (MILK OF MAGNESIA) suspension 30 mL  30 mL Oral Daily PRN Kerry Hough, PA-C   30 mL at 01/06/14 1826  . naproxen (NAPROSYN) tablet 250 mg  250 mg  Oral BID WC Nehemiah Settle, MD   250 mg at 01/09/14 0800  . risperiDONE (RISPERDAL) tablet 2 mg  2 mg Oral QHS Kerry Hough, PA-C   2 mg at 01/08/14 2103  . topiramate (TOPAMAX) tablet 75 mg  75 mg Oral BID Nehemiah Settle, MD   75 mg at 01/09/14 0819  . traZODone (DESYREL) tablet 50 mg  50 mg Oral QHS,MR X 1 Kerry Hough, PA-C   50 mg at 01/08/14 2217    Lab Results:  No results found for this or any previous visit (from the past 48 hour(s)).  Physical Findings: AIMS: Facial and Oral Movements Muscles of Facial Expression: None, normal Lips and Perioral Area: None, normal Jaw: None, normal Tongue: None, normal,Extremity Movements Upper (arms, wrists, hands, fingers): None, normal Lower (legs, knees, ankles, toes): None, normal, Trunk Movements Neck, shoulders, hips: None, normal, Overall Severity Severity of abnormal movements (highest score from questions above): None, normal Incapacitation due to abnormal movements: None, normal Patient's awareness of abnormal movements (rate only patient's report): No Awareness, Dental Status Current problems with teeth and/or dentures?: No Does patient usually wear dentures?: No  CIWA:    COWS:     Treatment Plan Summary: Daily contact with patient to assess and evaluate symptoms and progress in treatment Medication management  Plan: Treatment Plan/Recommendations:  1. Admit for crisis management and stabilization. 2. Medication management to reduce current symptoms to base line and improve the patient's overall level of functioning. Patient denied untoward effects, no changes made. 3. Treat health problems as indicated. 4. Develop treatment plan to decrease risk of relapse upon discharge and to reduce the need for readmission. 5. Psycho-social education regarding relapse prevention and self care. 6. Health care follow up as needed for medical problems. 7. Restart home medications where appropriate.   Medical  Decision Making Problem Points:  Established problem, worsening (2), New problem, with no additional work-up planned (3), Review of last therapy session (1) and Review of psycho-social stressors (1) Data Points:  Review or order clinical lab tests (1) Review or order medicine tests (1) Review of medication regiment & side effects (2) Review of new medications or change in dosage (2)  I certify that inpatient services furnished can reasonably be expected to improve the patient's condition.    Nanine Means, PMH-NP 01/09/2014 1:49 PM

## 2014-01-09 NOTE — [facility] Group Notes (Signed)
[facility] Group Notes:  (Clinical Social Work)  01/09/2014   1:15-2:15PM  Summary of Progress/Problems:   The main focus of today's process group was for the patient to identify ways in which they have sabotaged their own mental health wellness/recovery.  Motivational interviewing and a handout were used to explore the benefits and costs of their self-sabotaging behavior as well as the benefits and costs of changing this behavior.  The Stages of Change were explained to the group using a handout, and it was explained to patients the importance of making specific plans for how to handle self-sabotaging behaviors. The patient expressed she self-sabotages with negative self talk and perfectionism.  The cost of this self-defeating behavior is that she can never win/can never be perfect.  The patient was called out to see the NP, did not return.  Type of Therapy:  Process Group  Participation Level:  Active   Participation Quality:  Attentive and Sharing  Affect:  Depressed and Flat  Cognitive:  Appropriate  Insight:  Engaged  Engagement in Therapy:  Engaged  Modes of Intervention:  Education, Motivational Interviewing   Ambrose Mantle, LCSW 01/09/2014, 4:00pm

## 2014-01-09 NOTE — Progress Notes (Signed)
Patient ID: Regina Steele, female   DOB: 1981/03/03, 33 y.o.   MRN: [id number] D)  Was initially in her room this evening, but came out for group and has been interacting with select peers.  Went back to her room after group, was praying about issues in her life.  Came back to med window after group to get her hs meds, watched tv briefly before going back to her room, seemed distracted and preoccupied, asked for librium for anxiety. A)  Will continue to monitor for safety, continue POC R)  Safety maintained.

## 2014-01-09 NOTE — Progress Notes (Signed)
D.  Pt pleasant and in bed, denies complaints at this time . Positive for evening wrap up group.  Interacting appropriately within milieu.  Denies SI/HI/hallucinations at this time.  A.  Support and encouragement offered  R.  Pt remains safe on unit, will continue to monitor.

## 2014-01-09 NOTE — Progress Notes (Signed)
D elantra is seen OOB UAL on the 500 hall today...tolerated well. She is quiet. She keeps to herself. SHe takes her medications as ordered and she is attending her groups as planned.   A She writes she denies SI within the past 24 hrs , she rates her depression and hopelessness "1/2" and writes her DC plan is to " work on my slef-care".    R Safety is in place and poc maintaiend.

## 2014-01-09 NOTE — Progress Notes (Signed)
[facility] Group Notes:  (Nursing/MHT/Case Management/Adjunct)  Date:  01/09/2014  Time:  8:00 p.m.   Type of Therapy:  Psychoeducational Skills  Participation Level:  Active  Participation Quality:  Appropriate  Affect:  Appropriate  Cognitive:  Appropriate  Insight:  Good  Engagement in Group:  Developing/Improving  Modes of Intervention:  Education  Summary of Progress/Problems: The patient described her day as having been good overall. She states that she had some frustration to deal with, but she returned to her bedroom and "moved around" to deal with the situation. She also states that she has been less anxious. As for the theme for the day, her coping skills will include working on her relaxation, breathing, and supporting her peers.   Hazle Coca S 01/09/2014, 9:07 PM

## 2014-01-10 MED ORDER — FLEET ENEMA 7-19 GM/118ML RE ENEM
1.0000 | ENEMA | Freq: Every day | RECTAL | Status: DC | PRN
  Administered 2014-01-10: 1 via RECTAL
  Filled 2014-01-10 (×2): qty 1

## 2014-01-10 NOTE — Progress Notes (Signed)
[facility] Group Notes:  (Nursing/MHT/Case Management/Adjunct)  Date:  01/10/2014  Time:  8:00 p.m.   Type of Therapy:  Psychoeducational Skills  Participation Level:  Minimal  Participation Quality:  Attentive  Affect:  Depressed  Cognitive:  Appropriate  Insight:  Improving  Engagement in Group:  Lacking  Modes of Intervention:  Education  Summary of Progress/Problems: The patient very quietly in group and apologized to everyone in the room. According to the patient, she became upset while talking on the telephone in which she admits to yelling at her ex-husband. She indicated that she needs to get away from her ex-husband as well as her ex-mother-in-law. As a theme for the day, her support system will consist of her neighbors and family.   Hazle Coca S 01/10/2014, 8:57 PM

## 2014-01-10 NOTE — Progress Notes (Signed)
Patient ID: Regina Steele, female   DOB: 22-Jun-1981, 33 y.o.   MRN: [id number] [facility] MD Progress Note  01/10/2014 2:21 PM Regina Steele Both  MRN:  [id number] Subjective:  Patient says that mentally she is doing great. She gets overly sadden because she has to listen to people with their sad stories, she was able to utilize coping skills to de stress. She declines symptoms of anxiety or depression at this time. She did inquire about her Ativan. Upon discharge she lalns to go overeaters anonymous, she plans to set realistic goals, exercise, and eat healthy.  She is labile with her emotions, states her appetite and sleep are "good", forwards little information.   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   HENT: Negative.   Eyes: Negative.   Respiratory: Negative.   Cardiovascular: Negative.   Gastrointestinal: Negative.   Genitourinary: Negative.   Musculoskeletal: Negative.   Skin: Negative.   Neurological: Negative.   Endo/Heme/Allergies: Negative.   Psychiatric/Behavioral: Positive for depression. The patient is nervous/anxious.     Blood pressure 109/75, pulse 71, temperature 97.3 F (36.3 C), temperature source Oral, resp. rate 17, height 5\' 3"  (1.6 m), weight 60.782 kg (134 lb), last menstrual period 12/14/2003.Body mass index is 23.74 kg/(m^2).  General Appearance: Bizarre and Guarded  Eye Contact::  Fair  Speech:  Clear and Coherent  Volume:  Normal  Mood:  Anxious and Depressed  Affect:  Labile  Thought Process:  Coherent  Orientation:  Full (Time, Place, and Person)  Thought Content:  WDL  Suicidal  Thoughts:  No  Homicidal Thoughts:  No  Memory:  Immediate;   Fair  Judgement:  Impaired  Insight:  Lacking  Psychomotor Activity:  Restlessness  Concentration:  Fair  Recall:  [phone] of Knowledge:Fair  Language: Fair  Akathisia:  NA  Handed:  Right  AIMS (if indicated):     Assets:  Communication Skills Desire for Improvement Financial Resources/Insurance Housing Physical Health Resilience Social Support  Sleep:  Number of Hours: 6.75   Musculoskeletal: Strength & Muscle Tone: within normal limits Gait & Station: normal Patient leans: N/A  Current Medications: Current Facility-Administered Medications  Medication Dose Route Frequency Provider Last Rate Last Dose  . acetaminophen (TYLENOL) tablet 650 mg  650 mg Oral Q6H PRN [employer], PA-C      . alum & mag hydroxide-simeth (MAALOX/MYLANTA) 200-200-20 MG/5ML suspension 30 mL  30 mL Oral Q4H PRN 05-21-2001, PA-C      . buPROPion (WELLBUTRIN XL) 24 hr tablet 150 mg  150 mg Oral BH-q7a Spencer E Simon, PA-C   150 mg at 01/10/14 0601  . chlordiazePOXIDE (LIBRIUM) capsule 25 mg  25 mg Oral QID PRN 03/12/14, PA-C   25 mg at 01/09/14 2138  . escitalopram (LEXAPRO) tablet 20 mg  20 mg Oral Daily 2139, PA-C   20 mg at 01/10/14 0802  . estradiol (ESTRACE) tablet 2 mg  2 mg Oral QHS 03/12/14, PA-C   2 mg at 01/09/14 2138  . Estradiol vaginal tablet 10 mcg  1 tablet Vaginal Once per day on Mon  Thu Nehemiah Settle, MD      . fluticasone Aleda Grana) 50 MCG/ACT nasal spray 2 spray  2 spray Each Nare Daily Kerry Hough, PA-C   2 spray at 01/10/14 0803  . lamoTRIgine (LAMICTAL) tablet 25 mg  25 mg Oral BID Cleotis Nipper, MD   25 mg at 01/10/14 0803  . Linaclotide (LINZESS) capsule 290 mcg  290 mcg Oral Q0600 Nehemiah Settle, MD   290 mcg at 01/10/14 0600  . loratadine (CLARITIN) tablet 10 mg  10 mg Oral Daily Kerry Hough, PA-C   10 mg at 01/10/14 8127  . magnesium hydroxide  (MILK OF MAGNESIA) suspension 30 mL  30 mL Oral Daily PRN Kerry Hough, PA-C   30 mL at 01/06/14 1826  . naproxen (NAPROSYN) tablet 250 mg  250 mg Oral BID WC Nehemiah Settle, MD   250 mg at 01/10/14 0804  . risperiDONE (RISPERDAL) tablet 2 mg  2 mg Oral QHS Kerry Hough, PA-C   2 mg at 01/09/14 2138  . sodium phosphate (FLEET) 7-19 GM/118ML enema 1 enema  1 enema Rectal Daily PRN Truman Hayward, FNP      . topiramate (TOPAMAX) tablet 75 mg  75 mg Oral BID Nehemiah Settle, MD   75 mg at 01/10/14 0804  . traZODone (DESYREL) tablet 50 mg  50 mg Oral QHS,MR X 1 Kerry Hough, PA-C   50 mg at 01/09/14 2138    Lab Results:  No results found for this or any previous visit (from the past 48 hour(s)).  Physical Findings: AIMS: Facial and Oral Movements Muscles of Facial Expression: None, normal Lips and Perioral Area: None, normal Jaw: None, normal Tongue: None, normal,Extremity Movements Upper (arms, wrists, hands, fingers): None, normal Lower (legs, knees, ankles, toes): None, normal, Trunk Movements Neck, shoulders, hips: None, normal, Overall Severity Severity of abnormal movements (highest score from questions above): None, normal Incapacitation due to abnormal movements: None, normal Patient's awareness of abnormal movements (rate only patient's report): No Awareness, Dental Status Current problems with teeth and/or dentures?: No Does patient usually wear dentures?: No  CIWA:    COWS:     Treatment Plan Summary: Daily contact with patient to assess and evaluate symptoms and progress in treatment Medication management  Plan: Treatment Plan/Recommendations:  1. Admit for crisis management and stabilization. 2. Medication management to reduce current symptoms to base line and improve the patient's overall level of functioning. Patient denied untoward effects, no changes made. 3. Treat health problems as indicated. 4. Develop treatment plan to decrease risk  of relapse upon discharge and to reduce the need for readmission. 5. Psycho-social education regarding relapse prevention and self care. 6. Health care follow up as needed for medical problems. 7. Restart home medications where appropriate. 8- Constipation-will prescribe Fleet enema once daily prn for severe constipation.    Medical Decision Making Problem Points:  Established problem, worsening (2), New problem, with no additional work-up planned (3), Review of last therapy session (1) and Review of psycho-social stressors (1) Data Points:  Review or order clinical lab tests (1) Review or order medicine tests (1) Review of medication regiment & side effects (2) Review of new medications or change in dosage (2)  I certify that inpatient services furnished can reasonably be expected to improve the patient's condition.    Truman Hayward, PMH-NP 01/10/2014 2:21 PM

## 2014-01-10 NOTE — Progress Notes (Signed)
D. Pt has been up and has been active within the milieu today, states she is feeling better and denies any SI today. Pt has received medications without incident and did complain of constipation and did receive enema to assist. A. Support and encouragement provided, medication education given. R. Pt verbalized understanding, safety maintained.

## 2014-01-10 NOTE — Progress Notes (Signed)
Psychoeducational Group Note  Date: 01/10/2014 Time:  0930  Group Topic/Focus:  Gratefulness:  The focus of this group is to help patients identify what two things they are most grateful for in their lives. What helps ground them and to center them on their work to their recovery.  Participation Level:  Active  Participation Quality:  Appropriate  Affect:  Appropriate  Cognitive:  Oriented  Insight:  improving  Engagement in Group:  Engaged  Additional Comments:    Kamalani Mastro A   

## 2014-01-10 NOTE — Progress Notes (Signed)
Psychoeducational Group Note  Date: 01/10/2014 Time:  0930  Group Topic/Focus:  Gratefulness:  The focus of this group is to help patients identify what two things they are most grateful for in their lives. What helps ground them and to center them on their work to their recovery.  Participation Level:  Active  Participation Quality:  Appropriate  Affect:  Appropriate  Cognitive:  Oriented  Insight:  improving  Engagement in Group:  Engaged  Additional Comments:    Regina Steele   

## 2014-01-10 NOTE — Progress Notes (Signed)
Late entry for 01-09-14 Entered on 01-10-14  Psychoeducational Group Note  Date: 01/10/2014 Time:  1015  Group Topic/Focus:  Identifying Needs:   The focus of this group is to help patients identify their personal needs that have been historically problematic and identify healthy behaviors to address their needs.  Participation Level:  Active  Participation Quality:  Appropriate  Affect:  Appropriate  Cognitive:  Oriented  Insight:  Improving  Engagement in Group:  Engaged  Additional Comments:    Margree Gimbel A  

## 2014-01-10 NOTE — Progress Notes (Signed)
Late Entry for 01-09-14 Entered on 01-10-14  .Psychoeducational Group Note    Date: 01/10/2014 Time: 0930   Goal Setting Purpose of Group: To be able to set a goal that is measurable and that can be accomplished in one day Participation Level:  Active  Participation Quality:  Appropriate  Affect:  Appropriate  Cognitive:  Oriented  Insight:  Improving  Engagement in Group:  Engaged  Additional Comments:    Aryel Edelen A 

## 2014-01-10 NOTE — [facility] Group Notes (Signed)
[facility] Group Notes:  (Clinical Social Work)  01/10/2014   1:15-2:15PM  Summary of Progress/Problems:  The main focus of today's process group was to   identify the patient's current support system and decide on other supports that can be put in place.  The picture on workbook was used to discuss why additional supports are needed.  An emphasis was placed on using counselor, doctor, therapy groups, 12-step groups, and problem-specific support groups to expand supports.   There was also an extensive discussion about what constitutes a healthy support versus an unhealthy support.  The patient expressed full comprehension of the concepts presented, and agreed that there is a need to add more supports.  She was able to hear the definitions of the word support and conclude that family members who are not supportive of her mental illness issues are nonetheless loving and supportive in other ways.  Type of Therapy:  Process Group  Participation Level:  Active  Participation Quality:  Attentive and Sharing  Affect:  Blunted  Cognitive:  Appropriate and Oriented  Insight:  Engaged  Engagement in Therapy:  Engaged  Modes of Intervention:  Education,  Support and [employer], LCSW 01/10/2014, 4:00pm

## 2014-01-10 NOTE — Progress Notes (Signed)
D.  Pt pleasant on approach, no complaints voiced.  Positive for evening wrap up group, minimal interaction on unit.  Denies SI/HI/hallucinations at this time.  A.  Support and encouragement offered  R.  Pt remains safe at this time, will continue to monitor.

## 2014-01-11 DIAGNOSIS — F411 Generalized anxiety disorder: Secondary | ICD-10-CM

## 2014-01-11 DIAGNOSIS — F410 Panic disorder [episodic paroxysmal anxiety] without agoraphobia: Secondary | ICD-10-CM

## 2014-01-11 DIAGNOSIS — F3160 Bipolar disorder, current episode mixed, unspecified: Secondary | ICD-10-CM

## 2014-01-11 MED ORDER — LAMOTRIGINE 25 MG PO TABS: 25.0000 mg | ORAL_TABLET | Freq: Two times a day (BID) | ORAL | Status: DC

## 2014-01-11 MED ORDER — ESCITALOPRAM OXALATE 20 MG PO TABS: 20.0000 mg | ORAL_TABLET | Freq: Every day | ORAL | Status: DC

## 2014-01-11 MED ORDER — RISPERIDONE 2 MG PO TABS: 2.0000 mg | ORAL_TABLET | Freq: Every day | ORAL | Status: DC

## 2014-01-11 MED ORDER — BUPROPION HCL ER (XL) 150 MG PO TB24: 150.0000 mg | ORAL_TABLET | ORAL | Status: DC

## 2014-01-11 MED ORDER — TRAZODONE HCL 50 MG PO TABS
50.0000 mg | ORAL_TABLET | Freq: Every evening | ORAL | Status: DC | PRN
  Filled 2014-01-11: qty 3

## 2014-01-11 MED ORDER — TRAZODONE HCL 50 MG PO TABS: 50.0000 mg | ORAL_TABLET | Freq: Every evening | ORAL | Status: DC | PRN

## 2014-01-11 MED ORDER — TOPIRAMATE 25 MG PO TABS: 75.0000 mg | ORAL_TABLET | Freq: Two times a day (BID) | ORAL | Status: DC

## 2014-01-11 NOTE — Progress Notes (Signed)
Pt discharged to home.  Discharge instructions given and pt stated no questions.  Belongings returned from safe.  Follow-up appointments made and pt verbalized understanding.

## 2014-01-11 NOTE — Tx Team (Signed)
-  Interdisciplinary Treatment Plan Update (Adult)  Date: 01/11/2014  Time Reviewed:  9:45 AM  Progress in Treatment: Attending groups: Yes Participating in groups:  Yes Taking medication as prescribed:  Yes Tolerating medication:  Yes Family/Significant othe contact made: No, pt refused Patient understands diagnosis:  Yes Discussing patient identified problems/goals with staff:  Yes Medical problems stabilized or resolved:  Yes Denies suicidal/homicidal ideation: Yes Issues/concerns per patient self-inventory:  Yes Other:  New problem(s) identified: N/A  Discharge Plan or Barriers: Pt will follow up at [facility] for medication management and therapy.   Reason for Continuation of Hospitalization: Stable to d/c today  Comments: N/A  Estimated length of stay: D/C today  For review of initial/current patient goals, please see plan of care.  Attendees: Patient:  Regina Steele  01/11/2014 10:53 AM   Family:     Physician:  Dr. Javier Glazier 01/11/2014 10:51 AM   Nursing:   Neill Loft, RN 01/11/2014 10:51 AM   Clinical Social Worker:  Reyes Ivan, LCSW 01/11/2014 10:51 AM   Other: Claudette Head, PA 01/11/2014 10:51 AM   Other:  Sherrye Payor, care coordination 01/11/2014 10:51 AM   Other:  Juline Patch, LCSW 01/11/2014 10:51 AM   Other:  Quintella Reichert, RN 01/11/2014 10:51 AM   Other:  Chrisandra Netters, RN 01/11/2014 10:53 AM   Other: Cephas Darby, LCSW 01/11/2014 10:53 AM   Other: Nadean Corwin, RN 01/11/2014 10:53 AM   Other:    Other:    Other:     Scribe for Treatment Team:   Carmina Miller, 01/11/2014 10:51 AM
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
## 2014-01-11 NOTE — Progress Notes (Signed)
[facility] Adult Case Management Discharge Plan :  Will you be returning to the same living situation after discharge: Yes,  returning home At discharge, do you have transportation home?:Yes,  family will pick pt up Do you have the ability to pay for your medications:Yes,  provided pt with prescriptions and pt verbalizes ability to afford meds  Release of information consent forms completed and in the chart;  Patient's signature needed at discharge.  Patient to Follow up at: Follow-up Information   Follow up with [facility] On 01/18/2014. (Appointment scheduled at 10:15 am on this date with [name] for medication management)    Contact information:   [address] [place], [place] [address] Phone: [phone]      Follow up with [facility] On 01/21/2014. (Appointment scheduled at 11:00 am with [name] for therapy.)    Contact information:   [address] [place], [place] [address] Phone: [phone]       Patient denies SI/HI:   Yes,  denies SI/HI    Safety Planning and Suicide Prevention discussed:  Yes,  discussed with pt.  Pt refused consent to contact family/friend.  See suicide prevention education note.   Regina Steele 01/11/2014, 11:19 AM

## 2014-01-11 NOTE — Discharge Summary (Signed)
Physician Discharge Summary Note  Patient:  Regina Steele is an 33 y.o., female MRN:  [id number] DOB:  10-Sep-1981 Patient phone:  [phone] (home)  Patient address:   [address] [place] [address],  Total Time spent with patient: Greater than 30 minutes  Date of Admission:  01/04/2014 Date of Discharge: 01/11/2014  Reason for Admission:  Bipolar, mixed, anxiety, depression with SI with plan to cut wrists  Discharge Diagnoses: Active Problems:   ANXIETY DEPRESSION   PTSD (post-traumatic stress disorder)   Panic disorder without agoraphobia   Bipolar disorder, unspecified   Anxiety state, unspecified   GAD (generalized anxiety disorder)   Psychiatric Specialty Exam: Physical Exam  Review of Systems  Constitutional: Negative.   HENT: Negative.   Eyes: Negative.   Respiratory: Negative.   Cardiovascular: Negative.   Gastrointestinal: Negative.   Genitourinary: Negative.   Musculoskeletal: Negative.   Skin: Negative.   Neurological: Negative.   Endo/Heme/Allergies: Negative.   Psychiatric/Behavioral: Positive for depression. Negative for suicidal ideas, hallucinations and substance abuse. The patient is nervous/anxious. The patient does not have insomnia.     Blood pressure 93/60, pulse 90, temperature 97.5 F (36.4 C), temperature source Oral, resp. rate 18, height 5\' 3"  (1.6 m), weight 60.782 kg (134 lb), last menstrual period 12/14/2003.Body mass index is 23.74 kg/(m^2).  General Appearance: Casual  Eye Contact::  Fair  Speech:  Clear and Coherent  Volume:  Increased  Mood:  Anxious  Affect:  Appropriate  Thought Process:  Coherent  Orientation:  Full (Time, Place, and Person)  Thought Content:  WDL  Suicidal Thoughts:  No  Homicidal Thoughts:  No  Memory:  Immediate;   Good Recent;   Good Remote;   Good  Judgement:  Good  Insight:  Good  Psychomotor Activity:  Normal  Concentration:  Good  Recall:  Good  Fund of Knowledge:Fair  Language:  Good  Akathisia:  NA  Handed:  Right  AIMS (if indicated):     Assets:  Communication Skills Desire for Improvement Resilience  Sleep:  Number of Hours: 6.75     Musculoskeletal: Strength & Muscle Tone: within normal limits Gait & Station: normal Patient leans: N/A  DSM5:  Depressive Disorders:  Major Depressive Disorder - Severe (296.23)  Axis Diagnosis:   AXIS I:  Bipolar, mixed, Generalized Anxiety Disorder, Panic Disorder and Post Traumatic Stress Disorder AXIS II:  Deferred AXIS III:   Past Medical History  Diagnosis Date  . PTSD (post-traumatic stress disorder)   . Uterine prolapse 2013  . Depression   . Hiatal hernia   . Surgical menopause   . Chronic headache   . Constipation   . Esophageal reflux     no meds  . Osteopenia   . Eating disorder   . Anxiety   . Osteoporosis    AXIS IV:  other psychosocial or environmental problems and problems related to social environment AXIS V:  61-70 mild symptoms  Level of Care:  OP  Hospital Course:  Patient is a 33 year old Caucasian unemployed female who is known to this 32 from outpatient services, admitted to [facility] because of increased anxiety, lack of sleep, repetitive thoughts, obsession about killing herself, suicidal ideation with plan to cut her wrist. She could not contract for safety. She admitted that anxiety is worse in the past few days. She was taking Ativan more than she prescribed. She admitted to poor sleep and recently drinking more than usual, or down. She also endorsed binge  eating and then regret after the eating. She is under a lot of stress because of the ongoing court battle for custody. Patient told her husband does not have attorney and she may win the the case of custody but she is very concerned about the outcome. She admitted poor sleep, restlessness at night, racing thoughts, feeling of hopelessness and worthlessness. She is thinking about her children and feels ready regret about her  motherhood. She endorsed negative thinking that she is not a good mother. She admitted suicidal thoughts and plan to cut her wrist. She denies any hallucination or any paranoia but endorses social isolation, lack of sleep and having panic attacks. She's compliant with Risperdal, Topamax, Lexapro and Ativan. She also endorsed drinking alcohol however she denies any binge drinking. Patient appears very depressed anxious with poor eye contact. She's keep repeating her own words. She was seen 10 days ago in outpatient setting and certainly her symptoms are worse today. She is going to [employer] and none of [place] once a week. Even though her weight has been stable but she endorse binge eating. Patient is very weight conscious. Patient also experienced increased worsening of dreams, nightmares and flashback.  During Hospitalization: Medications managed, psychoeducation, group and individual therapy. Pt currently denies SI, HI, and Psychosis. At discharge, pt rates anxiety at 2/10 and depression at 1/10. Pt states that she does have a good supportive home environment and will followup with outpatient treatment. During discharge evaluation, pt continues to display a high level (apparently baseline for her) of anxiety with quivering of her voice as she answers questions. Pt may be minimizing anxiety symptoms at this time.  Affirms agreement with medication regimen and discharge plan. Denies other physical and psychological concerns at time of discharge.    Consults:  None  Significant Diagnostic Studies:  None  Discharge Vitals:   Blood pressure 93/60, pulse 90, temperature 97.5 F (36.4 C), temperature source Oral, resp. rate 18, height 5\' 3"  (1.6 m), weight 60.782 kg (134 lb), last menstrual period 12/14/2003. Body mass index is 23.74 kg/(m^2). Lab Results:   No results found for this or any previous visit (from the past 72 hour(s)).  Physical Findings: AIMS: Facial and Oral Movements Muscles of Facial  Expression: None, normal Lips and Perioral Area: None, normal Jaw: None, normal Tongue: None, normal,Extremity Movements Upper (arms, wrists, hands, fingers): None, normal Lower (legs, knees, ankles, toes): None, normal, Trunk Movements Neck, shoulders, hips: None, normal, Overall Severity Severity of abnormal movements (highest score from questions above): None, normal Incapacitation due to abnormal movements: None, normal Patient's awareness of abnormal movements (rate only patient's report): No Awareness, Dental Status Current problems with teeth and/or dentures?: No Does patient usually wear dentures?: No  CIWA:  CIWA-Ar Total: 0 COWS:     Psychiatric Specialty Exam: See Psychiatric Specialty Exam and Suicide Risk Assessment completed by Attending Physician prior to discharge.  Discharge destination:  Home  Is patient on multiple antipsychotic therapies at discharge:  No   Has Patient had three or more failed trials of antipsychotic monotherapy by history:  No  Recommended Plan for Multiple Antipsychotic Therapies: NA     Medication List       Indication   buPROPion 150 MG 24 hr tablet  Commonly known as:  WELLBUTRIN XL  Take 1 tablet (150 mg total) by mouth every morning.   Indication:  mood stabilization     cetirizine 10 MG tablet  Commonly known as:  ZYRTEC  Take 10 mg  by mouth daily.      docusate sodium 100 MG capsule  Commonly known as:  COLACE  Take 2 capsules (200 mg total) by mouth 2 (two) times daily. For constipation   Indication:  Constipation     escitalopram 20 MG tablet  Commonly known as:  LEXAPRO  Take 1 tablet (20 mg total) by mouth daily.   Indication:  mood stabilization     Estradiol 10 MCG Tabs vaginal tablet  Commonly known as:  VAGIFEM  Place 1 tablet (10 mcg total) vaginally 2 (two) times a week. On Mondays and Thursdays: For Vaginal atrophy   Indication:  Atrophic Vaginitis     estradiol 2 MG tablet  Commonly known as:  ESTRACE   Take 2 mg by mouth daily. For hormone replacement (Low estrogen)   Indication:  Deficiency of the Hormone Estrogen     fluticasone 50 MCG/ACT nasal spray  Commonly known as:  FLONASE  Place 2 sprays into the nose daily as needed for allergies or rhinitis.      ibandronate 150 MG tablet  Commonly known as:  BONIVA  Take 150 mg by mouth every 30 (thirty) days. Take in the morning with a full glass of water, on an empty stomach, and do not take anything else by mouth or lie down for the next 30 min.      ibuprofen 200 MG tablet  Commonly known as:  ADVIL,MOTRIN  Take 1 tablet (200 mg total) by mouth as needed for pain.   Indication:  Mild to Moderate Pain     lamoTRIgine 25 MG tablet  Commonly known as:  LAMICTAL  Take 1 tablet (25 mg total) by mouth 2 (two) times daily.   Indication:  mood stabilization     Linaclotide 145 MCG Caps capsule  Commonly known as:  LINZESS  290 mcg daily.   Indication:  Chronic Constipation of Unknown Cause, Constipation caused by Irritable Bowel Syndrome     LORazepam 1 MG tablet  Commonly known as:  ATIVAN  Take 1 tab as needed daily for anxiety      naproxen sodium 220 MG tablet  Commonly known as:  ANAPROX  Take 1 tablet (220 mg total) by mouth 2 (two) times daily with a meal.      risperiDONE 2 MG tablet  Commonly known as:  RISPERDAL  Take 1 tablet (2 mg total) by mouth at bedtime.   Indication:  mood stabilization     topiramate 25 MG tablet  Commonly known as:  TOPAMAX  Take 3 tablets (75 mg total) by mouth 2 (two) times daily.   Indication:  Mood stabilization     traZODone 50 MG tablet  Commonly known as:  DESYREL  Take 1 tablet (50 mg total) by mouth at bedtime as needed for sleep.   Indication:  Trouble Sleeping           Follow-up Information   Follow up with [facility] On 01/18/2014. (Appointment scheduled at 10:15 am on this date with [name] for medication management)    Contact information:    [address] [place], [place] [address] Phone: [phone]      Follow up with [facility] On 01/21/2014. (Appointment scheduled at 11:00 am with [name] for therapy.)    Contact information:   [address]ie [place], [place] [address] Phone: [phone]       Follow-up recommendations:  Activity:  As tolerated Diet:  Heart  healthy with low sodium.  Comments:   Take all medications as prescribed. Keep all follow-up appointments as scheduled.  Do not consume alcohol or use illegal drugs while on prescription medications. Report any adverse effects from your medications to your primary care provider promptly.  In the event of recurrent symptoms or worsening symptoms, call 911, a crisis hotline, or go to the nearest emergency department for evaluation.   Total Discharge Time:  Greater than 30 minutes.  Signed: Beau Fanny, FNP-BC 01/11/2014, 12:06 PM  Patient was seen for psychiatric evaluation, suicidal risk assessment and case discussed with treatment team and physician extender and disposition plan. Reviewed the information documented and agree with the treatment plan.  Anice Wilshire,JANARDHAHA R. 01/11/2014 4:26 PM

## 2014-01-11 NOTE — [facility] Suicide Risk Assessment (Signed)
Demographic Factors:  Adolescent or young adult, Caucasian and Unemployed  Total Time spent with patient: 30 minutes  Psychiatric Specialty Exam: Physical Exam  Nursing note and vitals reviewed. Constitutional: She is oriented to person, place, and time. She appears well-developed.  HENT:  Head: Normocephalic.  Eyes: Pupils are equal, round, and reactive to light.  Neck: Normal range of motion.  Cardiovascular: Normal rate.   Respiratory: Effort normal.  GI: Soft.  Musculoskeletal: Normal range of motion.  Neurological: She is alert and oriented to person, place, and time.  Skin: Skin is warm.    Review of Systems  Psychiatric/Behavioral: The patient is nervous/anxious.   All other systems reviewed and are negative.    Blood pressure 93/60, pulse 90, temperature 97.5 F (36.4 C), temperature source Oral, resp. rate 18, height 5\' 3"  (1.6 m), weight 60.782 kg (134 lb), last menstrual period 12/14/2003.Body mass index is 23.74 kg/(m^2).  General Appearance: Casual and Fairly Groomed  02/11/2004::  Fair  Speech:  Clear and Coherent and Slow  Volume:  Decreased  Mood:  Anxious  Affect:  Depressed  Thought Process:  Goal Directed and Intact  Orientation:  Full (Time, Place, and Person)  Thought Content:  WDL  Suicidal Thoughts:  No  Homicidal Thoughts:  No  Memory:  Immediate;   Fair  Judgement:  Intact  Insight:  Fair  Psychomotor Activity:  Normal  Concentration:  Fair  Recall:  [id number] of Knowledge:Good  Language: Good  Akathisia:  NA  Handed:  Right  AIMS (if indicated):     Assets:  Communication Skills Desire for Improvement Financial Resources/Insurance Housing Intimacy Leisure Time Physical Health Resilience Social Support Talents/Skills Transportation  Sleep:  Number of Hours: 6.75    Musculoskeletal: Strength & Muscle Tone: within normal limits Gait & Station: normal Patient leans: N/A   Mental Status Per Nursing Assessment::   On  Admission:  NA  Current Mental Status by Physician: NA  Loss Factors: NA  Historical Factors: Family history of mental illness or substance abuse and Impulsivity  Risk Reduction Factors:   Sense of responsibility to family, Religious beliefs about death, Living with another person, especially a relative, Positive social support, Positive therapeutic relationship and Positive coping skills or problem solving skills  Continued Clinical Symptoms:  Panic Attacks Bipolar Disorder:   Depressive phase More than one psychiatric diagnosis Previous Psychiatric Diagnoses and Treatments Medical Diagnoses and Treatments/Surgeries  Cognitive Features That Contribute To Risk:  Polarized thinking    Suicide Risk:  Minimal: No identifiable suicidal ideation.  Patients presenting with no risk factors but with morbid ruminations; may be classified as minimal risk based on the severity of the depressive symptoms  Discharge Diagnoses:   AXIS I:  Bipolar, Depressed, Panic Disorder and Post Traumatic Stress Disorder AXIS II:  Deferred AXIS III:   Past Medical History  Diagnosis Date  . PTSD (post-traumatic stress disorder)   . Uterine prolapse 2013  . Depression   . Hiatal hernia   . Surgical menopause   . Chronic headache   . Constipation   . Esophageal reflux     no meds  . Osteopenia   . Eating disorder   . Anxiety   . Osteoporosis    AXIS IV:  other psychosocial or environmental problems, problems related to social environment and problems with primary support group AXIS V:  61-70 mild symptoms  Plan Of Care/Follow-up recommendations:  Activity:  As tolerated Diet:  Regular  Is patient  on multiple antipsychotic therapies at discharge:  No   Has Patient had three or more failed trials of antipsychotic monotherapy by history:  No  Recommended Plan for Multiple Antipsychotic Therapies: NA    Dorthia Tout,JANARDHAHA R. 01/11/2014, 12:45 PM

## 2014-01-13 NOTE — Progress Notes (Signed)
Patient Discharge Instructions:  Next Level Care Provider Has Access to the EMR, 01/13/14 Records provided to [facility] via [facility]/Epic access.  Jerelene Redden, 01/13/2014, 3:46 PM

## 2014-01-14 ENCOUNTER — Encounter (HOSPITAL_COMMUNITY): Payer: Self-pay | Admitting: Emergency Medicine

## 2014-01-14 ENCOUNTER — Emergency Department (HOSPITAL_COMMUNITY)
Admission: EM | Admit: 2014-01-14 | Discharge: 2014-01-14 | Disposition: A | Discharge status: Home / Self Care | Payer: Medicare Other | Attending: Emergency Medicine | Admitting: Emergency Medicine

## 2014-01-14 DIAGNOSIS — IMO0002 Reserved for concepts with insufficient information to code with codable children: Secondary | ICD-10-CM | POA: Diagnosis not present

## 2014-01-14 DIAGNOSIS — F4310 Post-traumatic stress disorder, unspecified: Secondary | ICD-10-CM | POA: Diagnosis not present

## 2014-01-14 DIAGNOSIS — F3289 Other specified depressive episodes: Secondary | ICD-10-CM | POA: Insufficient documentation

## 2014-01-14 DIAGNOSIS — F329 Major depressive disorder, single episode, unspecified: Secondary | ICD-10-CM | POA: Insufficient documentation

## 2014-01-14 DIAGNOSIS — Z79899 Other long term (current) drug therapy: Secondary | ICD-10-CM | POA: Insufficient documentation

## 2014-01-14 DIAGNOSIS — M810 Age-related osteoporosis without current pathological fracture: Secondary | ICD-10-CM | POA: Insufficient documentation

## 2014-01-14 DIAGNOSIS — M79643 Pain in unspecified hand: Secondary | ICD-10-CM

## 2014-01-14 DIAGNOSIS — M25549 Pain in joints of unspecified hand: Secondary | ICD-10-CM | POA: Diagnosis not present

## 2014-01-14 DIAGNOSIS — F411 Generalized anxiety disorder: Secondary | ICD-10-CM | POA: Insufficient documentation

## 2014-01-14 DIAGNOSIS — K219 Gastro-esophageal reflux disease without esophagitis: Secondary | ICD-10-CM | POA: Insufficient documentation

## 2014-01-14 DIAGNOSIS — R209 Unspecified disturbances of skin sensation: Secondary | ICD-10-CM | POA: Diagnosis not present

## 2014-01-14 MED ORDER — PREDNISONE 10 MG PO TABS: ORAL_TABLET | ORAL | Status: DC

## 2014-01-14 MED ORDER — KETOROLAC TROMETHAMINE 60 MG/2ML IM SOLN
60.0000 mg | Freq: Once | INTRAMUSCULAR | Status: AC
  Administered 2014-01-14: 60 mg via INTRAMUSCULAR
  Filled 2014-01-14: qty 2

## 2014-01-14 MED ORDER — HYDROCODONE-ACETAMINOPHEN 5-325 MG PO TABS: 2.0000 | ORAL_TABLET | ORAL | Status: DC | PRN

## 2014-01-14 MED ORDER — PREDNISONE 20 MG PO TABS
60.0000 mg | ORAL_TABLET | Freq: Once | ORAL | Status: AC
  Administered 2014-01-14: 60 mg via ORAL
  Filled 2014-01-14: qty 3

## 2014-01-14 NOTE — ED Notes (Addendum)
Per pt, states her hands and fingers are burning and aching-states she took some naproxen with no relief-has appt with RA MD in May-symptoms have happened in past

## 2014-01-14 NOTE — ED Provider Notes (Signed)
Medical screening examination/treatment/procedure(s) were performed by non-physician practitioner and as supervising physician I was immediately available for consultation/collaboration.   EKG Interpretation None      Devoria Albe, MD, Armando Gang   Ward Givens, MD 01/14/14 [phone]

## 2014-01-14 NOTE — ED Provider Notes (Signed)
CSN: [id number]     Arrival date & time 01/14/14  0907 History   First MD Initiated Contact with Patient 01/14/14 [phone]     Chief Complaint  Patient presents with  . Hand Pain     (Consider location/radiation/quality/duration/timing/severity/associated sxs/prior Treatment) The history is provided by the patient. No language interpreter was used.   Patient is a 33 year old female who presents this morning with complaints of hand pain. She has a history of PTSD, depression and anxiety. She reports pain in both of her hands that started this morning. She also reports an accompanied burning sensation in both hands and burning and tingling in her elbows. She reports that this pain is so intense she was unable to prepare food and take care of her children this morning. She denies any problems with her back or recent injury. She denies any recent fever, chills or illness. She reports that she has seen her primary doctor for this same issue and that she has a rheumatology appointment upcoming. She also had a positive ANA in January however, she does not have a diagnosis attached to this. She denies any problems walking, unilateral weakness or problems with her speech. No history of confusion or dizziness.  Past Medical History  Diagnosis Date  . PTSD (post-traumatic stress disorder)   . Uterine prolapse 2013  . Depression   . Hiatal hernia   . Surgical menopause   . Chronic headache   . Constipation   . Esophageal reflux     no meds  . Osteopenia   . Eating disorder   . Anxiety   . Osteoporosis    Past Surgical History  Procedure Laterality Date  . Bilateral oophorectomy      bilat  . Rectocele repair    . Cesarean section     Family History  Problem Relation Age of Onset  . Breast cancer Maternal Grandmother 42  . Breast cancer Paternal Aunt 40  . Celiac disease Paternal Grandmother   . Heart disease Father   . Colon cancer Neg Hx   . Mental illness Mother   . Bipolar disorder  Maternal Grandmother   . Mental illness Brother    History  Substance Use Topics  . Smoking status: Never Smoker   . Smokeless tobacco: Never Used  . Alcohol Use: No   OB History   Grav Para Term Preterm Abortions TAB SAB Ect Mult Living   3 3 3       3      Review of Systems  Constitutional: Negative for fever and chills.  Musculoskeletal: Positive for arthralgias and myalgias. Negative for back pain, gait problem, joint swelling, neck pain and neck stiffness.  Neurological: Negative for dizziness, facial asymmetry, weakness, light-headedness and headaches.  All other systems reviewed and are negative.      Allergies  Review of patient's allergies indicates no known allergies.  Home Medications   Current Outpatient Rx  Name  Route  Sig  Dispense  Refill  . buPROPion (WELLBUTRIN XL) 150 MG 24 hr tablet   Oral   Take 1 tablet (150 mg total) by mouth every morning.   30 tablet   0   . cetirizine (ZYRTEC) 10 MG tablet   Oral   Take 10 mg by mouth daily.         [name] docusate sodium (COLACE) 100 MG capsule   Oral   Take 2 capsules (200 mg total) by mouth 2 (two) times daily. For constipation   10  capsule   0   . escitalopram (LEXAPRO) 20 MG tablet   Oral   Take 1 tablet (20 mg total) by mouth daily.   30 tablet   0   . estradiol (ESTRACE) 2 MG tablet   Oral   Take 2 mg by mouth daily. For hormone replacement (Low estrogen)         . Estradiol (VAGIFEM) 10 MCG TABS vaginal tablet   Vaginal   Place 1 tablet (10 mcg total) vaginally 2 (two) times a week. On Mondays and Thursdays: For Vaginal atrophy   8 tablet      . fluticasone (FLONASE) 50 MCG/ACT nasal spray   Nasal   Place 2 sprays into the nose daily as needed for allergies or rhinitis.         [name] ibandronate (BONIVA) 150 MG tablet   Oral   Take 150 mg by mouth every 30 (thirty) days. Take in the morning with a full glass of water, on an empty stomach, and do not take anything else by mouth or lie  down for the next 30 min.         [name] ibuprofen (ADVIL,MOTRIN) 200 MG tablet   Oral   Take 1 tablet (200 mg total) by mouth as needed for pain.   30 tablet   0   . lamoTRIgine (LAMICTAL) 25 MG tablet   Oral   Take 1 tablet (25 mg total) by mouth 2 (two) times daily.   60 tablet   0   . Linaclotide (LINZESS) 145 MCG CAPS capsule      290 mcg daily.         [name] LORazepam (ATIVAN) 1 MG tablet      Take 1 tab as needed daily for anxiety   30 tablet   0   . naproxen sodium (ANAPROX) 220 MG tablet   Oral   Take 1 tablet (220 mg total) by mouth 2 (two) times daily with a meal.   60 tablet   1   . risperiDONE (RISPERDAL) 2 MG tablet   Oral   Take 1 tablet (2 mg total) by mouth at bedtime.   30 tablet   0   . topiramate (TOPAMAX) 25 MG tablet   Oral   Take 3 tablets (75 mg total) by mouth 2 (two) times daily.   180 tablet   0   . traZODone (DESYREL) 50 MG tablet   Oral   Take 1 tablet (50 mg total) by mouth at bedtime as needed for sleep.   30 tablet   0    BP 101/63  Pulse 81  Temp(Src) 98.2 F (36.8 C) (Oral)  Resp 17  SpO2 96%  LMP 12/14/2003 Physical Exam  Nursing note and vitals reviewed. Constitutional: She is oriented to person, place, and time. She appears well-developed and well-nourished. No distress.  HENT:  Head: Normocephalic and atraumatic.  Eyes: Conjunctivae and EOM are normal. Pupils are equal, round, and reactive to light.  Neck: Normal range of motion. Neck supple. No JVD present. No tracheal deviation present. No thyromegaly present.  Cardiovascular: Normal rate, regular rhythm and normal heart sounds.   Pulmonary/Chest: Effort normal and breath sounds normal. No respiratory distress. She has no wheezes.  Abdominal: Soft. Bowel sounds are normal.  Musculoskeletal:  Somewhat restricted ROM in hands due to pain. Good sensation, strength and coordination.  Lymphadenopathy:    She has no cervical adenopathy.  Neurological: She is alert and  oriented to person,  place, and time. No cranial nerve deficit. Coordination normal.  Skin: Skin is warm and dry. No rash noted. No erythema.  Psychiatric: Her behavior is normal. Judgment and thought content normal. Her mood appears anxious.    ED Course  Procedures (including critical care time) Labs Review Labs Reviewed - No data to display Imaging Review No results found.   EKG Interpretation None      MDM   Final diagnoses:  Hand pain    No history of back injury. Has had at least one similar episode like this before with pain and burning sensation in her hands. + ANA in January. Has an upcoming Rheumatology appt. No unilateral weakness. No gait disturbances. Good sensation and coordination. Neuro exam reassuring. Toradol injection and oral prednisone given here with some relief. Prescriptions for prednisone taper and hydrocodone, few for mod-severe pain. Discussed plan of care with pt and she agrees. Return precautions given.       Irish Elders, NP 01/14/14 1139

## 2014-01-14 NOTE — Discharge Instructions (Signed)
Arthralgia Your caregiver has diagnosed you as suffering from an arthralgia. Arthralgia means there is pain in a joint. This can come from many reasons including:  Bruising the joint which causes soreness (inflammation) in the joint.  Wear and tear on the joints which occur as we grow older (osteoarthritis).  Overusing the joint.  Various forms of arthritis.  Infections of the joint. Regardless of the cause of pain in your joint, most of these different pains respond to anti-inflammatory drugs and rest. The exception to this is when a joint is infected, and these cases are treated with antibiotics, if it is a bacterial infection. HOME CARE INSTRUCTIONS   Rest the injured area for as long as directed by your caregiver. Then slowly start using the joint as directed by your caregiver and as the pain allows. Crutches as directed may be useful if the ankles, knees or hips are involved. If the knee was splinted or casted, continue use and care as directed. If an stretchy or elastic wrapping bandage has been applied today, it should be removed and re-applied every 3 to 4 hours. It should not be applied tightly, but firmly enough to keep swelling down. Watch toes and feet for swelling, bluish discoloration, coldness, numbness or excessive pain. If any of these problems (symptoms) occur, remove the ace bandage and re-apply more loosely. If these symptoms persist, contact your caregiver or return to this location.  For the first 24 hours, keep the injured extremity elevated on pillows while lying down.  Apply ice for 15-20 minutes to the sore joint every couple hours while awake for the first half day. Then 03-04 times per day for the first 48 hours. Put the ice in a plastic bag and place a towel between the bag of ice and your skin.  Wear any splinting, casting, elastic bandage applications, or slings as instructed.  Only take over-the-counter or prescription medicines for pain, discomfort, or fever as  directed by your caregiver. Do not use aspirin immediately after the injury unless instructed by your physician. Aspirin can cause increased bleeding and bruising of the tissues.  If you were given crutches, continue to use them as instructed and do not resume weight bearing on the sore joint until instructed. Persistent pain and inability to use the sore joint as directed for more than 2 to 3 days are warning signs indicating that you should see a caregiver for a follow-up visit as soon as possible. Initially, a hairline fracture (break in bone) may not be evident on X-rays. Persistent pain and swelling indicate that further evaluation, non-weight bearing or use of the joint (use of crutches or slings as instructed), or further X-rays are indicated. X-rays may sometimes not show a small fracture until a week or 10 days later. Make a follow-up appointment with your own caregiver or one to whom we have referred you. A radiologist (specialist in reading X-rays) may read your X-rays. Make sure you know how you are to obtain your X-ray results. Do not assume everything is normal if you do not hear from [place]. SEEK MEDICAL CARE IF: Bruising, swelling, or pain increases. SEEK IMMEDIATE MEDICAL CARE IF:   Your fingers or toes are numb or blue.  The pain is not responding to medications and continues to stay the same or get worse.  The pain in your joint becomes severe.  You develop a fever over 102 F (38.9 C).  It becomes impossible to move or use the joint. MAKE SURE YOU:  Understand these instructions.  Will watch your condition.  Will get help right away if you are not doing well or get worse. Document Released: 10/29/2005 Document Revised: 01/21/2012 Document Reviewed: 06/16/2008 [facility] Patient Information 2014 [place], [place].   Take meds as prescribed Take Naproxen for pain or discomfort Take Norco for mod-severe

## 2014-01-15 ENCOUNTER — Ambulatory Visit (HOSPITAL_COMMUNITY): Payer: Self-pay | Admitting: Psychology

## 2014-01-18 ENCOUNTER — Encounter (HOSPITAL_COMMUNITY): Payer: Self-pay | Admitting: Psychiatry

## 2014-01-18 ENCOUNTER — Ambulatory Visit (INDEPENDENT_AMBULATORY_CARE_PROVIDER_SITE_OTHER): Payer: Medicare Other | Admitting: Psychiatry

## 2014-01-18 VITALS — BP 115/73 | HR 78 | Ht 65.25 in | Wt 126.8 lb

## 2014-01-18 DIAGNOSIS — F319 Bipolar disorder, unspecified: Secondary | ICD-10-CM | POA: Diagnosis not present

## 2014-01-18 DIAGNOSIS — F4310 Post-traumatic stress disorder, unspecified: Secondary | ICD-10-CM

## 2014-01-18 MED ORDER — RISPERIDONE 2 MG PO TABS: 2.0000 mg | ORAL_TABLET | Freq: Two times a day (BID) | ORAL | Status: DC

## 2014-01-18 MED ORDER — LAMOTRIGINE 100 MG PO TABS: 100.0000 mg | ORAL_TABLET | Freq: Every day | ORAL | Status: DC

## 2014-01-18 MED ORDER — ESCITALOPRAM OXALATE 20 MG PO TABS: 20.0000 mg | ORAL_TABLET | Freq: Every day | ORAL | Status: DC

## 2014-01-18 MED ORDER — TOPIRAMATE 25 MG PO TABS: 75.0000 mg | ORAL_TABLET | Freq: Two times a day (BID) | ORAL | Status: DC

## 2014-01-18 MED ORDER — TRAZODONE HCL 50 MG PO TABS: 50.0000 mg | ORAL_TABLET | Freq: Every evening | ORAL | Status: DC | PRN
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
## 2014-01-19 ENCOUNTER — Ambulatory Visit (HOSPITAL_COMMUNITY): Payer: Self-pay | Admitting: Psychology

## 2014-01-20 ENCOUNTER — Telehealth (HOSPITAL_COMMUNITY): Payer: Self-pay | Admitting: *Deleted

## 2014-01-20 NOTE — Telephone Encounter (Signed)
Summary of VM left @ 0915, received at 1012 : Lamictal was increased to 75 mg,tried it and it made her feel more depressed, tearful and angry.Doesn't think it is helpful.Topamax was decreased from 300 mg to 150 mg, now increase in intrusive thoughts of trauma from past.Very upset, dosen't want anymore medication changes.just feels bad and doesn't want to try anymore.  Summary of VM recv'd 1035:Feeling some better after Lamictal 50 mg.MD at last practice had treated a lot of patients with PTSD.That's why he gave her Topamax 300 mg. She lost 2 hours of functionality this morning with the episode of PTSD and intrusive thoughts. The episodes usually come together,she anticipates it could happen again today or tomorrow. Was able to call a support person to help her this morning.  Contacted patient.She said the anxiety is better with the Lamictal 50 mg. Just can't take it at 75 mg at this time. Took 300 mg Topamax this morning when she was feeling so bad. Really wants to discuss medications with MD.

## 2014-01-21 ENCOUNTER — Ambulatory Visit (INDEPENDENT_AMBULATORY_CARE_PROVIDER_SITE_OTHER): Payer: Medicare Other | Admitting: Psychiatry

## 2014-01-21 DIAGNOSIS — F4310 Post-traumatic stress disorder, unspecified: Secondary | ICD-10-CM

## 2014-01-21 DIAGNOSIS — F319 Bipolar disorder, unspecified: Secondary | ICD-10-CM

## 2014-01-21 NOTE — Progress Notes (Signed)
   THERAPIST PROGRESS NOTE  Session Time: 11:00-11:50 pm  Participation Level: Active  Behavioral Response: Neat and Well GroomedAlertAnxious and Bizzarre  Type of Therapy: Individual Therapy  Treatment Goals addressed: Depression and Anxiety  Interventions: CBT and Supportive  Summary: RACHELANN ENLOE is a 33 y.o. female who presents with moderate anxiety, bizarre behaviors and made minimal eye contact. This is Pts first session with Clinical research associate after Pt was transferred to another therapist in [place] when Clinical research associate was relocated to the [place] office. Pt said she had one appointment with the other counselor, but did not feel a connection with her. Pt said she was recently hospitalized for a few day sin [facility] for high anxiety. Pt said it was helpful to be in the hospital and is much less anxious. Pt gave write an update since last session on her relationship to her husband and said they are still separated and she has a court hearing coming up to determine custody. Pt does not feel her husband is going to try to get any form of custody arraignments with the children due to not wanting to see them much now. Pt said he is still helping out financially. Pt denies any active depression symptoms and says anxiety is her primary stressor. Pt lacks basic social skills of healthy interactions with others and talked about how she struggles in having close relationships with other because she is "different from them". Pt talked about "how attractive she is" and how much "smarter than others she is".     Suicidal/Homicidal: No  Therapist Response: Assessed overall level of functioning, explored status and progress since writer transitioned Pt to another therapist, reviewed current stressors, signed updated treatment plan and processed feelings of anxiety and depression.   Plan: Return again at first available appointment and then begin weekly sessions.   Diagnosis: Axis I: Bipolar Disorder,  PTSD      Carman Ching, LCSW 01/21/2014

## 2014-01-28 ENCOUNTER — Ambulatory Visit (HOSPITAL_COMMUNITY): Payer: Self-pay | Admitting: Psychology

## 2014-01-29 ENCOUNTER — Ambulatory Visit (INDEPENDENT_AMBULATORY_CARE_PROVIDER_SITE_OTHER): Payer: Medicare Other | Admitting: Family Medicine

## 2014-01-29 ENCOUNTER — Encounter: Payer: Self-pay | Admitting: Family Medicine

## 2014-01-29 VITALS — BP 100/68 | HR 60 | Temp 97.1°F | Resp 14 | Ht 62.0 in | Wt 124.0 lb

## 2014-01-29 DIAGNOSIS — L708 Other acne: Secondary | ICD-10-CM | POA: Diagnosis not present

## 2014-01-29 DIAGNOSIS — L709 Acne, unspecified: Secondary | ICD-10-CM

## 2014-01-29 MED ORDER — MINOCYCLINE HCL 50 MG PO CAPS: 50.0000 mg | ORAL_CAPSULE | Freq: Two times a day (BID) | ORAL | Status: DC
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
## 2014-01-29 NOTE — Telephone Encounter (Signed)
Pt is seeing a therapist and is in a support group.  Wants to see a nutritionist to discuss good food options and better food choices, meal planning

## 2014-01-29 NOTE — Telephone Encounter (Signed)
I agree/ Approve.

## 2014-02-02 ENCOUNTER — Ambulatory Visit (HOSPITAL_COMMUNITY): Payer: Self-pay | Admitting: Psychology

## 2014-02-04 ENCOUNTER — Telehealth (HOSPITAL_COMMUNITY): Payer: Self-pay | Admitting: Psychiatry

## 2014-02-04 ENCOUNTER — Telehealth (HOSPITAL_COMMUNITY): Payer: Self-pay | Admitting: *Deleted

## 2014-02-04 MED ORDER — MIRTAZAPINE 15 MG PO TABS: 15.0000 mg | ORAL_TABLET | Freq: Every day | ORAL | Status: DC

## 2014-02-04 NOTE — Telephone Encounter (Signed)
Patient left VM:Not sleeping well for past 2 weeks.Gets 5 hrs sleep every other night.Cat naps during day-exhausted.Missed several important appts.Missed getting children on bus-late for school.Not feeding children well-just cereal & pizza.Trazodone not working. Maybe go back to Remeron.Can live with dreaming better than no sleep.

## 2014-02-04 NOTE — Telephone Encounter (Signed)
I returned patient's phone call.  She is not sleeping better with trazodone and restart taking Remeron 15 mg at bedtime with good response.  She still has remaining the Remeron however I recommended when she need refills than she should pharmacy for future refills.  I will discontinue trazodone.

## 2014-02-05 ENCOUNTER — Emergency Department (HOSPITAL_COMMUNITY)
Admission: EM | Admit: 2014-02-05 | Discharge: 2014-02-05 | Disposition: A | Discharge status: Home / Self Care | Payer: Medicare Other | Attending: Emergency Medicine | Admitting: Emergency Medicine

## 2014-02-05 ENCOUNTER — Encounter (HOSPITAL_COMMUNITY): Payer: Self-pay | Admitting: Emergency Medicine

## 2014-02-05 DIAGNOSIS — Z8739 Personal history of other diseases of the musculoskeletal system and connective tissue: Secondary | ICD-10-CM | POA: Diagnosis not present

## 2014-02-05 DIAGNOSIS — R112 Nausea with vomiting, unspecified: Secondary | ICD-10-CM | POA: Insufficient documentation

## 2014-02-05 DIAGNOSIS — K5900 Constipation, unspecified: Secondary | ICD-10-CM | POA: Insufficient documentation

## 2014-02-05 DIAGNOSIS — Z79899 Other long term (current) drug therapy: Secondary | ICD-10-CM | POA: Insufficient documentation

## 2014-02-05 DIAGNOSIS — Z792 Long term (current) use of antibiotics: Secondary | ICD-10-CM | POA: Insufficient documentation

## 2014-02-05 DIAGNOSIS — IMO0002 Reserved for concepts with insufficient information to code with codable children: Secondary | ICD-10-CM | POA: Diagnosis not present

## 2014-02-05 DIAGNOSIS — F411 Generalized anxiety disorder: Secondary | ICD-10-CM | POA: Insufficient documentation

## 2014-02-05 DIAGNOSIS — Z791 Long term (current) use of non-steroidal anti-inflammatories (NSAID): Secondary | ICD-10-CM | POA: Diagnosis not present

## 2014-02-05 DIAGNOSIS — R5383 Other fatigue: Secondary | ICD-10-CM

## 2014-02-05 DIAGNOSIS — Z8742 Personal history of other diseases of the female genital tract: Secondary | ICD-10-CM | POA: Insufficient documentation

## 2014-02-05 DIAGNOSIS — T4275XA Adverse effect of unspecified antiepileptic and sedative-hypnotic drugs, initial encounter: Secondary | ICD-10-CM | POA: Insufficient documentation

## 2014-02-05 DIAGNOSIS — R42 Dizziness and giddiness: Secondary | ICD-10-CM | POA: Diagnosis not present

## 2014-02-05 DIAGNOSIS — F4310 Post-traumatic stress disorder, unspecified: Secondary | ICD-10-CM | POA: Insufficient documentation

## 2014-02-05 DIAGNOSIS — F3289 Other specified depressive episodes: Secondary | ICD-10-CM | POA: Insufficient documentation

## 2014-02-05 DIAGNOSIS — F329 Major depressive disorder, single episode, unspecified: Secondary | ICD-10-CM | POA: Insufficient documentation

## 2014-02-05 DIAGNOSIS — R5381 Other malaise: Secondary | ICD-10-CM | POA: Diagnosis not present

## 2014-02-05 LAB — I-STAT CHEM 8, ED
BUN: 8 mg/dL (ref 6–23)
Calcium, Ion: 1.29 mmol/L — ABNORMAL HIGH (ref 1.12–1.23)
Chloride: 107 mEq/L (ref 96–112)
Creatinine, Ser: 1 mg/dL (ref 0.50–1.10)
Glucose, Bld: 86 mg/dL (ref 70–99)
HCT: 41 % (ref 36.0–46.0)
Hemoglobin: 13.9 g/dL (ref 12.0–15.0)
POTASSIUM: 3.8 meq/L (ref 3.7–5.3)
SODIUM: 141 meq/L (ref 137–147)
TCO2: 23 mmol/L (ref 0–100)

## 2014-02-05 LAB — CBG MONITORING, ED: GLUCOSE-CAPILLARY: 100 mg/dL — AB (ref 70–99)

## 2014-02-05 MED ORDER — ONDANSETRON HCL 4 MG/2ML IJ SOLN
4.0000 mg | Freq: Once | INTRAMUSCULAR | Status: AC
  Administered 2014-02-05: 4 mg via INTRAVENOUS
  Filled 2014-02-05: qty 2

## 2014-02-05 MED ORDER — SODIUM CHLORIDE 0.9 % IV BOLUS (SEPSIS)
1000.0000 mL | Freq: Once | INTRAVENOUS | Status: AC
  Administered 2014-02-05: 1000 mL via INTRAVENOUS

## 2014-02-05 NOTE — ED Notes (Signed)
Pt reports that she is "feeling much better" and nausea resolved. Pt A&O and in NAD

## 2014-02-05 NOTE — ED Notes (Signed)
Per pt, states PCP increased one of meds-took dose this am and proceeded to work out-after exercising she got "woozie" and vomited

## 2014-02-05 NOTE — ED Notes (Signed)
Pt from home states that she was working out this am and "almost passed out." Pt denies hx of the same, no hx of DM. Pt lung sounds clear, BS in all 4 quads. Pt last BM yesterday. Pt denies urinary s/sx, feeling sick. Pt is A&O and in NAD

## 2014-02-05 NOTE — ED Provider Notes (Signed)
CSN: [id number]     Arrival date & time 02/05/14  8676 History   First MD Initiated Contact with Patient 02/05/14 [phone]     Chief Complaint  Patient presents with  . Medication Problem     (Consider location/radiation/quality/duration/timing/severity/associated sxs/prior Treatment) The history is provided by the patient and medical records.    Patient with hx PTSD, anxiety, depression presents with wooziness, N/V x 1 that occurred after taking morning medications.   On review of her chart, there have been several changes to her psych medications recently.  She states she has been taking 25mg  Lamictal BID because increased doses help her anxiety but make her depression worse (was recommended to be on 50mg  BID).  Yesterday her anxiety was worse than usual so she took an extra 25mg  dose at night.  This morning she ate breakfast and did some light exercise walking on a treadmill, she then took her morning medications and had a sudden onset of feeling fatigued, nauseated, hot.  She vomited once and then felt much better, back to normal.  States she has an appointment with [facility] on April 1 and is comfortable waiting until then to discuss any medication changes but wanted to know if what happened to her this morning was a side effect of the medications.  States she occasionally experiences the sudden fatigue after taking her medications, especially in the morning. Denies fevers, CP, SOB, recent illness.    Past Medical History  Diagnosis Date  . PTSD (post-traumatic stress disorder)   . Uterine prolapse 2013  . Depression   . Hiatal hernia   . Surgical menopause   . Chronic headache   . Constipation   . Esophageal reflux     no meds  . Osteopenia   . Eating disorder   . Anxiety   . Osteoporosis    Past Surgical History  Procedure Laterality Date  . Bilateral oophorectomy      bilat  . Rectocele repair    . Cesarean section     Family History  Problem Relation Age of  Onset  . Breast cancer Maternal Grandmother 32  . Breast cancer Paternal Aunt 40  . Celiac disease Paternal Grandmother   . Heart disease Father   . Colon cancer Neg Hx   . Mental illness Mother   . Bipolar disorder Maternal Grandmother   . Mental illness Brother    History  Substance Use Topics  . Smoking status: Never Smoker   . Smokeless tobacco: Never Used  . Alcohol Use: No   OB History   Grav Para Term Preterm Abortions TAB SAB Ect Mult Living   3 3 3       3      Review of Systems  Constitutional: Negative for fever.  Respiratory: Negative for cough and shortness of breath.   Cardiovascular: Negative for chest pain.  Gastrointestinal: Positive for nausea and vomiting. Negative for abdominal pain and diarrhea.  Genitourinary: Negative for dysuria, urgency, frequency, vaginal bleeding and vaginal discharge.  Neurological: Positive for light-headedness.  All other systems reviewed and are negative.      Allergies  Review of patient's allergies indicates no known allergies.  Home Medications   Current Outpatient Rx  Name  Route  Sig  Dispense  Refill  . buPROPion (WELLBUTRIN XL) 150 MG 24 hr tablet   Oral   Take 1 tablet (150 mg total) by mouth every morning.   30 tablet   0   . cetirizine (  ZYRTEC) 10 MG tablet   Oral   Take 10 mg by mouth daily.         [name] docusate sodium (COLACE) 100 MG capsule   Oral   Take 2 capsules (200 mg total) by mouth 2 (two) times daily. For constipation   10 capsule   0   . escitalopram (LEXAPRO) 20 MG tablet   Oral   Take 1 tablet (20 mg total) by mouth daily.   30 tablet   0   . estradiol (ESTRACE) 2 MG tablet   Oral   Take 2 mg by mouth daily. For hormone replacement (Low estrogen)         . Estradiol (VAGIFEM) 10 MCG TABS vaginal tablet   Vaginal   Place 1 tablet (10 mcg total) vaginally 2 (two) times a week. On Mondays and Thursdays: For Vaginal atrophy   8 tablet      . fluticasone (FLONASE) 50 MCG/ACT  nasal spray   Nasal   Place 2 sprays into the nose daily as needed for allergies or rhinitis.         [name] HYDROcodone-acetaminophen (NORCO/VICODIN) 5-325 MG per tablet   Oral   Take 2 tablets by mouth every 4 (four) hours as needed.   10 tablet   0   . ibandronate (BONIVA) 150 MG tablet   Oral   Take 150 mg by mouth every 30 (thirty) days. Take in the morning with a full glass of water, on an empty stomach, and do not take anything else by mouth or lie down for the next 30 min.         [name] ibuprofen (ADVIL,MOTRIN) 200 MG tablet   Oral   Take 1 tablet (200 mg total) by mouth as needed for pain.   30 tablet   0   . lamoTRIgine (LAMICTAL) 100 MG tablet   Oral   Take 1 tablet (100 mg total) by mouth daily.   60 tablet   0   . Linaclotide (LINZESS) 145 MCG CAPS capsule      290 mcg daily.         [name] LORazepam (ATIVAN) 1 MG tablet      Take 1 tab as needed daily for anxiety   30 tablet   0   . minocycline (MINOCIN,DYNACIN) 50 MG capsule   Oral   Take 1 capsule (50 mg total) by mouth 2 (two) times daily.   60 capsule   3   . mirtazapine (REMERON) 15 MG tablet   Oral   Take 1 tablet (15 mg total) by mouth at bedtime.   30 tablet   0   . naproxen sodium (ANAPROX) 220 MG tablet   Oral   Take 1 tablet (220 mg total) by mouth 2 (two) times daily with a meal.   60 tablet   1   . predniSONE (DELTASONE) 10 MG tablet      Take 5 pills on day one, take 4 pills on day 2, take 3 pills on day three, take 2 pills on day four and take 1 pill on day five.   15 tablet   0   . risperiDONE (RISPERDAL) 2 MG tablet   Oral   Take 1 tablet (2 mg total) by mouth 2 (two) times daily.   60 tablet   0   . topiramate (TOPAMAX) 25 MG tablet   Oral   Take 3 tablets (75 mg total) by mouth 2 (two) times daily.  180 tablet   0   . traZODone (DESYREL) 50 MG tablet   Oral   Take 1 tablet (50 mg total) by mouth at bedtime as needed for sleep.   30 tablet   0    BP 125/85  Pulse  99  Temp(Src) 97.7 F (36.5 C) (Oral)  Resp 18  SpO2 98%  LMP 12/14/2003 Physical Exam  Nursing note and vitals reviewed. Constitutional: She appears well-developed and well-nourished. No distress.  HENT:  Head: Normocephalic and atraumatic.  Neck: Neck supple.  Cardiovascular: Normal rate and regular rhythm.   Pulmonary/Chest: Effort normal and breath sounds normal. No respiratory distress. She has no wheezes. She has no rales.  Abdominal: Soft. She exhibits no distension. There is no tenderness. There is no rebound and no guarding.  Neurological: She is alert.  Skin: She is not diaphoretic.  Psychiatric: Her speech is normal.    ED Course  Procedures (including critical care time) Labs Review Labs Reviewed  CBG MONITORING, ED - Abnormal; Notable for the following:    Glucose-Capillary 100 (*)    All other components within normal limits  I-STAT CHEM 8, ED - Abnormal; Notable for the following:    Calcium, Ion 1.29 (*)    All other components within normal limits   Imaging Review No results found.   EKG Interpretation   Date/Time:  Friday February 05 2014 10:03:13 EDT Ventricular Rate:  71 PR Interval:  152 QRS Duration: 88 QT Interval:  390 QTC Calculation: 424 R Axis:   81 Text Interpretation:  Sinus rhythm Baseline wander in lead(s) V2 Sinus  rhythm Artifact Borderline ECG Confirmed by Gerhard Munch  MD ([id number]) on  02/05/2014 10:16:43 AM      MDM   Final diagnoses:  Lightheadedness  Nausea and vomiting    Pt on multiple psychiatric medications and many medications in general presents with lightheadedness, flush, N/V, sudden fatigue following taking morning medications - this all resolved with vomiting.  Possible medication side effect.  EKG without prolonged QT.  CBG unremarkable. HR increased with standing on orthostatics but BP stable.  Pt feeling better after IVF and zofran.  D/C home with psychiatry follow up for medication adjustments as needed.  Discussed result, findings, treatment, and follow up  with patient.  Pt given return precautions.  Pt verbalizes understanding and agrees with plan.        Trixie Dredge, PA-C 02/05/14 1504

## 2014-02-05 NOTE — ED Provider Notes (Signed)
  Medical screening examination/treatment/procedure(s) were performed by non-physician practitioner and as supervising physician I was immediately available for consultation/collaboration.   EKG Interpretation   Date/Time:  Friday February 05 2014 10:03:13 EDT Ventricular Rate:  71 PR Interval:  152 QRS Duration: 88 QT Interval:  390 QTC Calculation: 424 R Axis:   81 Text Interpretation:  Sinus rhythm Baseline wander in lead(s) V2 Sinus  rhythm Artifact Borderline ECG Confirmed by Gerhard Munch  MD ([id number]) on  02/05/2014 10:16:43 AM         Gerhard Munch, MD 02/05/14 [id number]

## 2014-02-05 NOTE — ED Notes (Signed)
Patient denies pain and is resting comfortably.  

## 2014-02-05 NOTE — Discharge Instructions (Signed)
Read the information below.  You may return to the Emergency Department at any time for worsening condition or any new symptoms that concern you.  Please follow up with your psychiatrist regarding your home medications.  Drink plenty of water over the next few days. If you develop severe lightheadedness/dizziness, chest or abdominal pain, uncontrolled vomiting, or are unable to tolerate fluids by mouth, return to the ER for a recheck.      Nausea and Vomiting Nausea is a sick feeling that often comes before throwing up (vomiting). Vomiting is a reflex where stomach contents come out of your mouth. Vomiting can cause severe loss of body fluids (dehydration). Children and elderly adults can become dehydrated quickly, especially if they also have diarrhea. Nausea and vomiting are symptoms of a condition or disease. It is important to find the cause of your symptoms. CAUSES   Direct irritation of the stomach lining. This irritation can result from increased acid production (gastroesophageal reflux disease), infection, food poisoning, taking certain medicines (such as nonsteroidal anti-inflammatory drugs), alcohol use, or tobacco use.  Signals from the brain.These signals could be caused by a headache, heat exposure, an inner ear disturbance, increased pressure in the brain from injury, infection, a tumor, or a concussion, pain, emotional stimulus, or metabolic problems.  An obstruction in the gastrointestinal tract (bowel obstruction).  Illnesses such as diabetes, hepatitis, gallbladder problems, appendicitis, kidney problems, cancer, sepsis, atypical symptoms of a heart attack, or eating disorders.  Medical treatments such as chemotherapy and radiation.  Receiving medicine that makes you sleep (general anesthetic) during surgery. DIAGNOSIS Your caregiver may ask for tests to be done if the problems do not improve after a few days. Tests may also be done if symptoms are severe or if the reason for  the nausea and vomiting is not clear. Tests may include:  Urine tests.  Blood tests.  Stool tests.  Cultures (to look for evidence of infection).  X-rays or other imaging studies. Test results can help your caregiver make decisions about treatment or the need for additional tests. TREATMENT You need to stay well hydrated. Drink frequently but in small amounts.You may wish to drink water, sports drinks, clear broth, or eat frozen ice pops or gelatin dessert to help stay hydrated.When you eat, eating slowly may help prevent nausea.There are also some antinausea medicines that may help prevent nausea. HOME CARE INSTRUCTIONS   Take all medicine as directed by your caregiver.  If you do not have an appetite, do not force yourself to eat. However, you must continue to drink fluids.  If you have an appetite, eat a normal diet unless your caregiver tells you differently.  Eat a variety of complex carbohydrates (rice, wheat, potatoes, bread), lean meats, yogurt, fruits, and vegetables.  Avoid high-fat foods because they are more difficult to digest.  Drink enough water and fluids to keep your urine clear or pale yellow.  If you are dehydrated, ask your caregiver for specific rehydration instructions. Signs of dehydration may include:  Severe thirst.  Dry lips and mouth.  Dizziness.  Dark urine.  Decreasing urine frequency and amount.  Confusion.  Rapid breathing or pulse. SEEK IMMEDIATE MEDICAL CARE IF:   You have blood or brown flecks (like coffee grounds) in your vomit.  You have black or bloody stools.  You have a severe headache or stiff neck.  You are confused.  You have severe abdominal pain.  You have chest pain or trouble breathing.  You do not urinate at  least once every 8 hours.  You develop cold or clammy skin.  You continue to vomit for longer than 24 to 48 hours.  You have a fever. MAKE SURE YOU:   Understand these instructions.  Will watch  your condition.  Will get help right away if you are not doing well or get worse. Document Released: 10/29/2005 Document Revised: 01/21/2012 Document Reviewed: 03/28/2011 [facility] Patient Information 2014 [place], [place].  Dizziness  Dizziness means you feel unsteady or lightheaded. You might feel like you are going to pass out (faint). HOME CARE   Drink enough fluids to keep your pee (urine) clear or pale yellow.  Take your medicines exactly as told by your doctor. If you take blood pressure medicine, always stand up slowly from the lying or sitting position. Hold on to something to steady yourself.  If you need to stand in one place for a long time, move your legs often. Tighten and relax your leg muscles.  Have someone stay with you until you feel okay.  Do not drive or use heavy machinery if you feel dizzy.  Do not drink alcohol. GET HELP RIGHT AWAY IF:   You feel dizzy or lightheaded and it gets worse.  You feel sick to your stomach (nauseous), or you throw up (vomit).  You have trouble talking or walking.  You feel weak or have trouble using your arms, hands, or legs.  You cannot think clearly or have trouble forming sentences.  You have chest pain, belly (abdominal) pain, sweating, or you are short of breath.  Your vision changes.  You are bleeding.  You have problems from your medicine that seem to be getting worse. MAKE SURE YOU:   Understand these instructions.  Will watch your condition.  Will get help right away if you are not doing well or get worse. Document Released: 10/18/2011 Document Revised: 01/21/2012 Document Reviewed: 10/18/2011 [facility] Patient Information 2014 [place], [place].

## 2014-02-09 ENCOUNTER — Ambulatory Visit (HOSPITAL_COMMUNITY): Payer: Self-pay | Admitting: Psychology

## 2014-02-10 ENCOUNTER — Other Ambulatory Visit (HOSPITAL_COMMUNITY): Payer: Self-pay | Admitting: Psychiatry

## 2014-02-10 ENCOUNTER — Encounter (HOSPITAL_COMMUNITY): Payer: Self-pay | Admitting: Psychiatry

## 2014-02-10 ENCOUNTER — Ambulatory Visit (INDEPENDENT_AMBULATORY_CARE_PROVIDER_SITE_OTHER): Payer: Medicare Other | Admitting: Psychiatry

## 2014-02-10 VITALS — BP 123/84 | HR 81 | Ht 65.25 in | Wt 128.4 lb

## 2014-02-10 DIAGNOSIS — F4310 Post-traumatic stress disorder, unspecified: Secondary | ICD-10-CM | POA: Diagnosis not present

## 2014-02-10 DIAGNOSIS — F319 Bipolar disorder, unspecified: Secondary | ICD-10-CM | POA: Diagnosis not present

## 2014-02-10 MED ORDER — MIRTAZAPINE 15 MG PO TABS: 15.0000 mg | ORAL_TABLET | Freq: Every day | ORAL | Status: DC

## 2014-02-10 MED ORDER — BUPROPION HCL ER (XL) 150 MG PO TB24: 150.0000 mg | ORAL_TABLET | ORAL | Status: DC

## 2014-02-10 MED ORDER — ESCITALOPRAM OXALATE 20 MG PO TABS: 20.0000 mg | ORAL_TABLET | Freq: Every day | ORAL | Status: DC

## 2014-02-10 MED ORDER — RISPERIDONE 2 MG PO TABS: 2.0000 mg | ORAL_TABLET | Freq: Two times a day (BID) | ORAL | Status: DC
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
## 2014-02-11 ENCOUNTER — Ambulatory Visit (INDEPENDENT_AMBULATORY_CARE_PROVIDER_SITE_OTHER): Payer: Medicare Other | Admitting: Psychiatry

## 2014-02-11 DIAGNOSIS — F4310 Post-traumatic stress disorder, unspecified: Secondary | ICD-10-CM

## 2014-02-11 DIAGNOSIS — F319 Bipolar disorder, unspecified: Secondary | ICD-10-CM

## 2014-02-11 NOTE — Progress Notes (Signed)
   THERAPIST PROGRESS NOTE  Session Time: 3:15-3:55 pm  Participation Level: Active  Behavioral Response: Neat and Well GroomedAlertAnxious  Type of Therapy: Individual Therapy  Treatment Goals addressed: Anxiety and Depression  Interventions: CBT, Solution Focused and Supportive  childrens school because they were concerned about the amount of tardiness her children are having due to Pt struggling with getting them around for school in the morning. Pt said the kids also have head lice again and she believes they are getting from her mother-in-laws house.  Pt said she explained to the staff about her mental and physical health issues that impact her ability to get up some mornings and the school has been supportive of including a Child psychotherapist to help pt with this to ensure the children attend on time. Pt processed this with Clinical research associate and explored ways to get a better night time and morning time routine in place to make her more successful. Pt agreed to focus on this for homework at next session for further discussion. Pts thoughts were scattered and required some redirection. Writer helped Pt gain insight into how getting managing her own mental health by helping get her children to school on time regularly will help her be more successful with her goals of going back to school and getting a career. Pt was able to see it, but still seemed to have interest in her bigger picture goals of school.    Suicidal/Homicidal: No  Therapist Response: Assessed overall level of functioning per Pt self report and behaviors during session, processed feelings associated with school stress with her children and explored ways Pt can have a healthier routine for maintaining her own mental health and wellness to allow her to be able to get her kids to school on time, educated on the importance of her own mental wellbeing as a priority for her family.   Plan: Return again at the first available appointment.    Diagnosis: Axis I: PTSD and Bipolar Disorder        Carman Ching, LCSW 02/11/2014

## 2014-02-15 ENCOUNTER — Telehealth (HOSPITAL_COMMUNITY): Payer: Self-pay | Admitting: *Deleted

## 2014-02-15 DIAGNOSIS — F319 Bipolar disorder, unspecified: Secondary | ICD-10-CM

## 2014-02-15 MED ORDER — LORAZEPAM 1 MG PO TABS: ORAL_TABLET | ORAL | Status: DC

## 2014-02-15 NOTE — Telephone Encounter (Signed)
Patient left VM 4/3 @ 0920--VM rec'vd 4/6 @ 0852:Saw Dr.Arfeen 4/1.He seemed to be in a hurry.She told him she took Topomax 250 mg,but prescription she picked up only for 150 mg.Did not discuss Lorazepam refill,but needs it.Not filled in last month.

## 2014-02-15 NOTE — Telephone Encounter (Signed)
Dr. Lucianne Muss (in Dr. Sheela Stack absence) authorized refill of Lorazepam as previously ordered by [name]. Contacted pharmacy.Per pharmacy, Topamax last ordered 01/18/14 by Dr. Arfeen:Topomax, 25 mg, Take 3 tablets twice daily.Pharmacy states prescription sent escript on 01/18/14 was picked up on 02/10/14.  Contacted patient.Left message. Advised refill for Lorazepam authorized by Dr.Kumar and called to pharmacy. Advised pt that [name] had only ordered Topamax once on 01/18/14 at dose given to her when she was discharged from hospital.This was the prescription she picked up on 02/10/14.Dr. Lucianne Muss reviewed his notes:Per Dr. Sheela Stack notes, patient receives Topamax prescription from another MD, and found no further orders for Topamax since 01/18/14 from [name].Advised patient to contact that MD if dose of Topamax needs to be adjusted at this time.

## 2014-02-18 ENCOUNTER — Encounter: Payer: Self-pay | Admitting: *Deleted

## 2014-02-18 ENCOUNTER — Encounter: Payer: Medicare Other | Attending: Physician Assistant | Admitting: *Deleted

## 2014-02-18 DIAGNOSIS — F509 Eating disorder, unspecified: Secondary | ICD-10-CM | POA: Diagnosis not present

## 2014-02-18 DIAGNOSIS — F410 Panic disorder [episodic paroxysmal anxiety] without agoraphobia: Secondary | ICD-10-CM | POA: Insufficient documentation

## 2014-02-18 DIAGNOSIS — F319 Bipolar disorder, unspecified: Secondary | ICD-10-CM | POA: Insufficient documentation

## 2014-02-18 DIAGNOSIS — F411 Generalized anxiety disorder: Secondary | ICD-10-CM | POA: Diagnosis not present

## 2014-02-18 DIAGNOSIS — F4310 Post-traumatic stress disorder, unspecified: Secondary | ICD-10-CM | POA: Insufficient documentation

## 2014-02-18 DIAGNOSIS — Z713 Dietary counseling and surveillance: Secondary | ICD-10-CM | POA: Insufficient documentation
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
## 2014-02-23 ENCOUNTER — Ambulatory Visit (HOSPITAL_COMMUNITY): Payer: Self-pay | Admitting: Psychiatry

## 2014-02-24 ENCOUNTER — Telehealth (HOSPITAL_COMMUNITY): Payer: Self-pay | Admitting: *Deleted

## 2014-02-24 ENCOUNTER — Other Ambulatory Visit (HOSPITAL_COMMUNITY): Payer: Self-pay | Admitting: Psychiatry

## 2014-02-24 MED ORDER — TOPIRAMATE 100 MG PO TABS: ORAL_TABLET | ORAL | Status: DC

## 2014-02-24 NOTE — Telephone Encounter (Signed)
Pt left VM: Had dicussed Topamax with [name] in past.Needs prescription. Dose needs to be at 250 mg or symptoms of PTSD more severe.Needs refill of Remeron, has been taking two at night sometimes, not sure how [name] will feel about that. Having problems with insomnia, due to increase in stress.Also having daytime lethargy due to insomnia.     NOTE:info from call received 02/12/14 and responded to- copied from patient chart: "Patient left VM 4/3 @ 0920--VM rec'vd 4/6 @ 0852:Saw Dr.Arfeen 4/1.He seemed to be in a hurry.She told him she took Topomax 250 mg,but prescription she picked up only for 150 mg. Left message for patient 02/15/14 with following information: Advised pt that [name] had only ordered Topamax once on 01/18/14 at dose given to her when she was discharged from hospital.This was the prescription she picked up on 02/10/14.Dr. Lucianne Muss reviewed his notes:Per Dr. Sheela Stack notes, patient receives Topamax prescription from another MD, and found no further orders for Topamax since 01/18/14 from [name].Advised patient to contact that MD if dose of Topamax needs to be adjusted at this time."

## 2014-02-24 NOTE — Telephone Encounter (Signed)
I returned patient's phone call.  She is taking Topamax 250 mg a day.  She is feeling better with a higher dose.  She needed a new prescription.  I will call the prescription to her pharmacy.

## 2014-02-26 DIAGNOSIS — G43009 Migraine without aura, not intractable, without status migrainosus: Secondary | ICD-10-CM | POA: Diagnosis not present

## 2014-03-04 ENCOUNTER — Ambulatory Visit: Payer: Self-pay | Admitting: *Deleted

## 2014-03-05 ENCOUNTER — Ambulatory Visit: Payer: Self-pay | Admitting: *Deleted

## 2014-03-08 ENCOUNTER — Ambulatory Visit (INDEPENDENT_AMBULATORY_CARE_PROVIDER_SITE_OTHER): Payer: Medicare Other | Admitting: Psychiatry

## 2014-03-08 DIAGNOSIS — F319 Bipolar disorder, unspecified: Secondary | ICD-10-CM
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
## 2014-03-11 ENCOUNTER — Ambulatory Visit: Payer: Self-pay | Admitting: *Deleted

## 2014-03-11 ENCOUNTER — Other Ambulatory Visit (HOSPITAL_COMMUNITY): Payer: Self-pay | Admitting: Psychiatry

## 2014-03-11 DIAGNOSIS — F319 Bipolar disorder, unspecified: Secondary | ICD-10-CM

## 2014-03-15 DIAGNOSIS — J019 Acute sinusitis, unspecified: Secondary | ICD-10-CM | POA: Diagnosis not present

## 2014-03-16 ENCOUNTER — Telehealth (HOSPITAL_COMMUNITY): Payer: Self-pay | Admitting: *Deleted

## 2014-03-16 DIAGNOSIS — F319 Bipolar disorder, unspecified: Secondary | ICD-10-CM

## 2014-03-16 MED ORDER — LORAZEPAM 1 MG PO TABS: ORAL_TABLET | ORAL | Status: DC

## 2014-03-16 NOTE — Telephone Encounter (Signed)
Patient left XQ:JJHERDEY has not heard from office about Lorazepam refill.Sent in last week. Contacted pharmacy as records show prescription called in to pharmacy 03/15/14 @ 1322. Per pharmacy 5/5 @ 1045, no record of receiving phone call. Prescription given to pharmacist at this time. Patient notifed

## 2014-03-18 ENCOUNTER — Encounter: Payer: Medicare Other | Attending: Physician Assistant | Admitting: *Deleted

## 2014-03-18 DIAGNOSIS — F319 Bipolar disorder, unspecified: Secondary | ICD-10-CM | POA: Diagnosis not present

## 2014-03-18 DIAGNOSIS — F4310 Post-traumatic stress disorder, unspecified: Secondary | ICD-10-CM | POA: Diagnosis not present

## 2014-03-18 DIAGNOSIS — F509 Eating disorder, unspecified: Secondary | ICD-10-CM

## 2014-03-18 DIAGNOSIS — Z713 Dietary counseling and surveillance: Secondary | ICD-10-CM | POA: Diagnosis not present

## 2014-03-18 DIAGNOSIS — F411 Generalized anxiety disorder: Secondary | ICD-10-CM | POA: Diagnosis not present

## 2014-03-18 DIAGNOSIS — F410 Panic disorder [episodic paroxysmal anxiety] without agoraphobia: Secondary | ICD-10-CM | POA: Diagnosis not present
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
## 2014-03-18 NOTE — Patient Instructions (Addendum)
   Call attorney and social services.   [email or web address] for sermons  You are beautifully made by the creator of the universe. He doesn't make mistakes  Follow NEDA (national eating disorders association), EDbites, Proud[employer], Body Politics by Rosario Adie, [employer] on [employer] is fuel.  We need that energy to survive.  We need a variety of different types of foods for optimal health.   Goals for eating:  3 meals each day eaten at the table without distractions.    Each meal to contain starch: bread, rice, cereal, oatmeal, pasta, potato,   Each meal to contain protein: milk, yogurt, chicken, egg, cheese, meat  Each meal to contain fruit or vegetable or both  Aim to drink lot of water

## 2014-03-22 ENCOUNTER — Other Ambulatory Visit (HOSPITAL_COMMUNITY): Payer: Self-pay | Admitting: Psychiatry

## 2014-03-22 ENCOUNTER — Ambulatory Visit (INDEPENDENT_AMBULATORY_CARE_PROVIDER_SITE_OTHER): Payer: Medicare Other | Admitting: Psychiatry

## 2014-03-22 DIAGNOSIS — F319 Bipolar disorder, unspecified: Secondary | ICD-10-CM

## 2014-03-22 DIAGNOSIS — F4310 Post-traumatic stress disorder, unspecified: Secondary | ICD-10-CM
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
## 2014-03-24 ENCOUNTER — Ambulatory Visit (INDEPENDENT_AMBULATORY_CARE_PROVIDER_SITE_OTHER): Payer: Medicare Other | Admitting: Psychiatry

## 2014-03-24 ENCOUNTER — Encounter (HOSPITAL_COMMUNITY): Payer: Self-pay | Admitting: Psychiatry

## 2014-03-24 VITALS — BP 119/81 | HR 89 | Ht 65.25 in | Wt 126.0 lb

## 2014-03-24 DIAGNOSIS — F4310 Post-traumatic stress disorder, unspecified: Secondary | ICD-10-CM

## 2014-03-24 DIAGNOSIS — F319 Bipolar disorder, unspecified: Secondary | ICD-10-CM | POA: Diagnosis not present

## 2014-03-24 MED ORDER — RISPERIDONE 2 MG PO TABS: 2.0000 mg | ORAL_TABLET | Freq: Two times a day (BID) | ORAL | Status: DC

## 2014-03-24 MED ORDER — ESCITALOPRAM OXALATE 20 MG PO TABS: 20.0000 mg | ORAL_TABLET | Freq: Every day | ORAL | Status: DC

## 2014-03-24 MED ORDER — BUPROPION HCL ER (XL) 150 MG PO TB24: 150.0000 mg | ORAL_TABLET | ORAL | Status: DC

## 2014-03-24 MED ORDER — TOPIRAMATE 100 MG PO TABS: ORAL_TABLET | ORAL | Status: DC

## 2014-03-24 MED ORDER — MIRTAZAPINE 15 MG PO TABS: 15.0000 mg | ORAL_TABLET | Freq: Every day | ORAL | Status: DC

## 2014-03-24 MED ORDER — LORAZEPAM 1 MG PO TABS: ORAL_TABLET | ORAL | Status: DC
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
## 2014-03-25 ENCOUNTER — Encounter: Payer: Medicare Other | Admitting: *Deleted

## 2014-03-25 DIAGNOSIS — F509 Eating disorder, unspecified: Secondary | ICD-10-CM | POA: Diagnosis not present

## 2014-03-25 DIAGNOSIS — F4310 Post-traumatic stress disorder, unspecified: Secondary | ICD-10-CM | POA: Diagnosis not present

## 2014-03-25 DIAGNOSIS — F319 Bipolar disorder, unspecified: Secondary | ICD-10-CM | POA: Diagnosis not present

## 2014-03-25 DIAGNOSIS — F411 Generalized anxiety disorder: Secondary | ICD-10-CM | POA: Diagnosis not present

## 2014-03-25 DIAGNOSIS — F410 Panic disorder [episodic paroxysmal anxiety] without agoraphobia: Secondary | ICD-10-CM | POA: Diagnosis not present

## 2014-03-25 DIAGNOSIS — Z713 Dietary counseling and surveillance: Secondary | ICD-10-CM | POA: Diagnosis not present

## 2014-03-25 NOTE — Progress Notes (Signed)
Assessment:  Erratic eating.  Sometimes she eats all the recommendations and sometimes she just gets the protein and the vegetable.  Last night had cheese and vegetables.  Feelings of guilt  Today's recall: B: oatmeal and yogurt L: rice, 2 chicken breast tenderloins a and a banana   Nutrition Diagnosis:  Harmful beliefs about food or nutrition related to adequate calorie needs for health.  As evidenced by eating disorder behaviors.  Intervention: Encouraged patient to reject traditional diet mentality of "good" vs "bad" foods.  There are no good and bad foods, but rather food is fuel that we needs for our bodies.  When we don't get enough fuel, our bodies suffer the metabolic consequences.  Encouraged patient to eat whatever foods will satisfy them, regardless of their nutritional value.  We will discuss nutritional values of foods at a subsequent appointment.  Encouraged patient to honor their body's internal hunger and fullness cues.  Throughout the day, check in mentally and rate hunger.  Try not to eat when ravenous, but instead when slightly hungry.  Then choose food(s) that will be satisfying regardless of nutritional content.  Sit down to enjoy those foods.  Minimize distractions: turn off tv, put away books, work, Programmer, applications.  Make the meal last at least 20 minutes in order to give time to experience and register satiety.  Stop eating when full regardless of how much food is left on the plate.  Get more if still hungry.  The key is to honor fullness so throughout the meal, rate fullness factor and stop when comfortably full, but not stuffed.  Reminded patient that they can have any food they want, whenever they want, and however much they want.  Eventually the novelty will wear out and each food will be equal in terms of its emotional appeal.  This will be a learning process and some days more food will be eaten, some days less.  The key is to honor hunger and fullness without any feelings of  guilt.  Pay attention to what the internal cues are, rather than any external factors.  Goals: Think on things that would be fun for you and make a list.   Go to farmers' market and try 1 new food: [address] [place], [place] [address] Make something this week to eat that you like  Practice eating without guilt  Monitoring: patient will follow up in 1 week

## 2014-03-25 NOTE — Patient Instructions (Signed)
Think on things that would be fun for you and make a list.   Go to farmers' market and try 1 new food: [address] [place] [address], [place], [place] [address] Make something this week to eat that you like  Practice eating without guilt

## 2014-03-26 ENCOUNTER — Emergency Department (HOSPITAL_COMMUNITY)
Admission: EM | Admit: 2014-03-26 | Discharge: 2014-03-26 | Disposition: A | Discharge status: Home / Self Care | Payer: Medicare Other | Attending: Emergency Medicine | Admitting: Emergency Medicine

## 2014-03-26 ENCOUNTER — Encounter (HOSPITAL_COMMUNITY): Payer: Self-pay | Admitting: Emergency Medicine

## 2014-03-26 DIAGNOSIS — Z792 Long term (current) use of antibiotics: Secondary | ICD-10-CM | POA: Diagnosis not present

## 2014-03-26 DIAGNOSIS — K5900 Constipation, unspecified: Secondary | ICD-10-CM | POA: Insufficient documentation

## 2014-03-26 DIAGNOSIS — F3289 Other specified depressive episodes: Secondary | ICD-10-CM | POA: Diagnosis not present

## 2014-03-26 DIAGNOSIS — Z791 Long term (current) use of non-steroidal anti-inflammatories (NSAID): Secondary | ICD-10-CM | POA: Insufficient documentation

## 2014-03-26 DIAGNOSIS — Z8739 Personal history of other diseases of the musculoskeletal system and connective tissue: Secondary | ICD-10-CM | POA: Insufficient documentation

## 2014-03-26 DIAGNOSIS — F329 Major depressive disorder, single episode, unspecified: Secondary | ICD-10-CM | POA: Insufficient documentation

## 2014-03-26 DIAGNOSIS — R0789 Other chest pain: Secondary | ICD-10-CM | POA: Diagnosis not present

## 2014-03-26 DIAGNOSIS — F411 Generalized anxiety disorder: Secondary | ICD-10-CM | POA: Diagnosis not present

## 2014-03-26 DIAGNOSIS — Z8742 Personal history of other diseases of the female genital tract: Secondary | ICD-10-CM | POA: Diagnosis not present

## 2014-03-26 DIAGNOSIS — IMO0002 Reserved for concepts with insufficient information to code with codable children: Secondary | ICD-10-CM | POA: Diagnosis not present

## 2014-03-26 DIAGNOSIS — Z8719 Personal history of other diseases of the digestive system: Secondary | ICD-10-CM | POA: Insufficient documentation

## 2014-03-26 DIAGNOSIS — F4310 Post-traumatic stress disorder, unspecified: Secondary | ICD-10-CM | POA: Insufficient documentation

## 2014-03-26 DIAGNOSIS — R079 Chest pain, unspecified: Secondary | ICD-10-CM | POA: Diagnosis not present

## 2014-03-26 DIAGNOSIS — G8929 Other chronic pain: Secondary | ICD-10-CM | POA: Diagnosis not present

## 2014-03-26 DIAGNOSIS — Z79899 Other long term (current) drug therapy: Secondary | ICD-10-CM | POA: Diagnosis not present

## 2014-03-26 DIAGNOSIS — G43909 Migraine, unspecified, not intractable, without status migrainosus: Secondary | ICD-10-CM | POA: Diagnosis not present

## 2014-03-26 LAB — CBC WITH DIFFERENTIAL/PLATELET
BASOS PCT: 0 % (ref 0–1)
Basophils Absolute: 0 10*3/uL (ref 0.0–0.1)
Eosinophils Absolute: 0 10*3/uL (ref 0.0–0.7)
Eosinophils Relative: 1 % (ref 0–5)
HCT: 37.5 % (ref 36.0–46.0)
Hemoglobin: 13 g/dL (ref 12.0–15.0)
LYMPHS ABS: 1.5 10*3/uL (ref 0.7–4.0)
Lymphocytes Relative: 21 % (ref 12–46)
MCH: 30.7 pg (ref 26.0–34.0)
MCHC: 34.7 g/dL (ref 30.0–36.0)
MCV: 88.4 fL (ref 78.0–100.0)
Monocytes Absolute: 0.5 10*3/uL (ref 0.1–1.0)
Monocytes Relative: 7 % (ref 3–12)
NEUTROS PCT: 71 % (ref 43–77)
Neutro Abs: 5 10*3/uL (ref 1.7–7.7)
PLATELETS: 280 10*3/uL (ref 150–400)
RBC: 4.24 MIL/uL (ref 3.87–5.11)
RDW: 12 % (ref 11.5–15.5)
WBC: 7.1 10*3/uL (ref 4.0–10.5)

## 2014-03-26 LAB — BASIC METABOLIC PANEL
BUN: 16 mg/dL (ref 6–23)
CO2: 22 mEq/L (ref 19–32)
Calcium: 9 mg/dL (ref 8.4–10.5)
Chloride: 103 mEq/L (ref 96–112)
Creatinine, Ser: 0.89 mg/dL (ref 0.50–1.10)
GFR calc non Af Amer: 84 mL/min — ABNORMAL LOW (ref >90)
GLUCOSE: 133 mg/dL — AB (ref 70–99)
POTASSIUM: 3.5 meq/L — AB (ref 3.7–5.3)
SODIUM: 138 meq/L (ref 137–147)

## 2014-03-26 LAB — TROPONIN I: Troponin I: <0.3 ng/mL (ref <0.30)

## 2014-03-26 MED ORDER — DIPHENHYDRAMINE HCL 50 MG/ML IJ SOLN
25.0000 mg | Freq: Once | INTRAMUSCULAR | Status: AC
  Administered 2014-03-26: 25 mg via INTRAMUSCULAR
  Filled 2014-03-26: qty 1

## 2014-03-26 MED ORDER — KETOROLAC TROMETHAMINE 60 MG/2ML IM SOLN
60.0000 mg | Freq: Once | INTRAMUSCULAR | Status: AC
  Administered 2014-03-26: 60 mg via INTRAMUSCULAR
  Filled 2014-03-26: qty 2

## 2014-03-26 MED ORDER — METOCLOPRAMIDE HCL 5 MG/ML IJ SOLN
10.0000 mg | Freq: Once | INTRAMUSCULAR | Status: AC
  Administered 2014-03-26: 10 mg via INTRAMUSCULAR
  Filled 2014-03-26: qty 2

## 2014-03-26 NOTE — ED Notes (Signed)
EKG given to EDP Pollina for review 

## 2014-03-26 NOTE — ED Provider Notes (Addendum)
CSN: [id number]     Arrival date & time 03/26/14  1912 History   First MD Initiated Contact with Patient 03/26/14 1935     Chief Complaint  Patient presents with  . Chest Pain     (Consider location/radiation/quality/duration/timing/severity/associated sxs/prior Treatment) HPI Comments: Patient presents to ER for evaluation of chest pain. Patient reports that the symptoms began around 6 PM. She has been having intermittent pressure in the center of her chest that lasts for approximately a minute and then resolves. This is not related to exertion. She does report a previous history of chest pains that were diagnosed as anxiety attack.  Patient also complains of headache. She has a history of migraine headache which is chronic and recurring. She is experiencing a global throbbing headache that is similar to previous migraines. She was slow in onset, no thunderclap  Patient is a 33 y.o. female presenting with chest pain.  Chest Pain Associated symptoms: headache     Past Medical History  Diagnosis Date  . PTSD (post-traumatic stress disorder)   . Uterine prolapse 2013  . Depression   . Hiatal hernia   . Surgical menopause   . Chronic headache   . Constipation   . Esophageal reflux     no meds  . Osteopenia   . Eating disorder   . Anxiety   . Osteoporosis    Past Surgical History  Procedure Laterality Date  . Bilateral oophorectomy      bilat  . Rectocele repair    . Cesarean section    . Abdominal hysterectomy     Family History  Problem Relation Age of Onset  . Breast cancer Maternal Grandmother 41  . Breast cancer Paternal Aunt 40  . Celiac disease Paternal Grandmother   . Heart disease Father   . Colon cancer Neg Hx   . Mental illness Mother   . Bipolar disorder Maternal Grandmother   . Mental illness Brother    History  Substance Use Topics  . Smoking status: Never Smoker   . Smokeless tobacco: Never Used  . Alcohol Use: No   OB History   Grav Para Term  Preterm Abortions TAB SAB Ect Mult Living   3 3 3       3      Review of Systems  Cardiovascular: Positive for chest pain.  Neurological: Positive for headaches.  All other systems reviewed and are negative.     Allergies  Gluten meal  Home Medications   Prior to Admission medications   Medication Sig Start Date End Date Taking? Authorizing Provider  buPROPion (WELLBUTRIN XL) 150 MG 24 hr tablet Take 1 tablet (150 mg total) by mouth every morning. 03/24/14   03/26/14, MD  cetirizine (ZYRTEC) 10 MG tablet Take 10 mg by mouth daily.    Historical Provider, MD  docusate sodium (COLACE) 100 MG capsule Take 2 capsules (200 mg total) by mouth 2 (two) times daily. For constipation 07/13/13   09/12/13, NP  escitalopram (LEXAPRO) 20 MG tablet Take 1 tablet (20 mg total) by mouth daily. 03/24/14   03/26/14, MD  estradiol (ESTRACE) 2 MG tablet Take 2 mg by mouth daily. For hormone replacement (Low estrogen) 07/13/13   09/12/13, NP  Estradiol (VAGIFEM) 10 MCG TABS vaginal tablet Place 1 tablet (10 mcg total) vaginally 2 (two) times a week. On Mondays and Thursdays: For Vaginal atrophy 07/13/13   09/12/13, NP  fluticasone [facility]) 50 MCG/ACT  nasal spray Place 2 sprays into the nose daily as needed for allergies or rhinitis.    Historical Provider, MD  HYDROcodone-acetaminophen (NORCO/VICODIN) 5-325 MG per tablet Take 2 tablets by mouth every 4 (four) hours as needed. 01/14/14   Irish Elders, NP  ibandronate (BONIVA) 150 MG tablet Take 150 mg by mouth every 30 (thirty) days. Take in the morning with a full glass of water, on an empty stomach, and do not take anything else by mouth or lie down for the next 30 min.    Historical Provider, MD  ibuprofen (ADVIL,MOTRIN) 200 MG tablet Take 1 tablet (200 mg total) by mouth as needed for pain. 07/13/13   Sanjuana Kava, NP  Linaclotide [facility] Einstein) 145 MCG CAPS capsule Take 290 mcg by mouth daily.  11/02/13   Mardella Layman, MD  LORazepam  (ATIVAN) 1 MG tablet TAKE 1 TABLET BY MOUTH EVERY DAY AS NEEDED 03/24/14   Cleotis Nipper, MD  minocycline (MINOCIN,DYNACIN) 50 MG capsule Take 1 capsule (50 mg total) by mouth 2 (two) times daily. 01/29/14   Donita Brooks, MD  mirtazapine (REMERON) 15 MG tablet Take 1 tablet (15 mg total) by mouth at bedtime. 03/24/14 03/24/15  Cleotis Nipper, MD  naproxen sodium (ANAPROX) 220 MG tablet Take 1 tablet (220 mg total) by mouth 2 (two) times daily with a meal. 11/18/13   Dorena Bodo, PA-C  risperiDONE (RISPERDAL) 2 MG tablet Take 1 tablet (2 mg total) by mouth 2 (two) times daily. 03/24/14   Cleotis Nipper, MD  topiramate (TOPAMAX) 100 MG tablet Take 1 in am and 1 1/2 at bed time 03/24/14   Cleotis Nipper, MD   BP 96/49  Pulse 76  Temp(Src) 98.5 F (36.9 C) (Oral)  Resp 20  Ht 5\' 2"  (1.575 m)  Wt 125 lb (56.7 kg)  BMI 22.86 kg/m2  SpO2 98%  LMP 12/14/2003 Physical Exam  Constitutional: She is oriented to person, place, and time. She appears well-developed and well-nourished. No distress.  HENT:  Head: Normocephalic and atraumatic.  Right Ear: Hearing normal.  Left Ear: Hearing normal.  Nose: Nose normal.  Mouth/Throat: Oropharynx is clear and moist and mucous membranes are normal.  Eyes: Conjunctivae and EOM are normal. Pupils are equal, round, and reactive to light.  Neck: Normal range of motion. Neck supple.  Cardiovascular: Regular rhythm, S1 normal and S2 normal.  Exam reveals no gallop and no friction rub.   No murmur heard. Pulmonary/Chest: Effort normal and breath sounds normal. No respiratory distress. She exhibits no tenderness.  Abdominal: Soft. Normal appearance and bowel sounds are normal. There is no hepatosplenomegaly. There is no tenderness. There is no rebound, no guarding, no tenderness at McBurney's point and negative Murphy's sign. No hernia.  Musculoskeletal: Normal range of motion.  Neurological: She is alert and oriented to person, place, and time. She has normal strength.  No cranial nerve deficit or sensory deficit. Coordination normal. GCS eye subscore is 4. GCS verbal subscore is 5. GCS motor subscore is 6.  Skin: Skin is warm, dry and intact. No rash noted. No cyanosis.  Psychiatric: She has a normal mood and affect. Her speech is normal and behavior is normal. Thought content normal.    ED Course  Procedures (including critical care time) Labs Review Labs Reviewed  BASIC METABOLIC PANEL - Abnormal; Notable for the following:    Potassium 3.5 (*)    Glucose, Bld 133 (*)    GFR calc non  Af Amer 84 (*)    All other components within normal limits  CBC WITH DIFFERENTIAL  TROPONIN I    Imaging Review No results found.   EKG Interpretation   Date/Time:  Friday Mar 26 2014 19:20:48 EDT Ventricular Rate:  78 PR Interval:  150 QRS Duration: 89 QT Interval:  385 QTC Calculation: 438 R Axis:   89 Text Interpretation:  Sinus rhythm Normal ECG Confirmed by Kaysee Hergert  MD,  Cassian Torelli ([id number]) on 03/26/2014 7:24:27 PM      MDM   Final diagnoses:  Migraine  Chest pain    Presents with atypical chest pain. Pain is intermittent, at rest and only last for a minute. Cardiac workup normal. No concern for acute coronary syndrome or cardiac etiology of pain. Likewise, PE is felt to be unlikely. Her only risk factor would be exogenous estrogen, but PERC/Wells negative. Patient reassured, symptoms are secondary to her extreme anxiety.  Patient also complaining of headache. She does have a history of migraines similar to this. Her headache has completely resolved with Toradol, Reglan and Benadryl. This is reassuring. She does not require any imaging or further workup today, she has had similar headaches before and her neurologic examination was normal.    Gilda Crease, MD 03/26/14 [id number]  Gilda Crease, MD 04/08/14 [phone]

## 2014-03-26 NOTE — Discharge Instructions (Signed)
Chest Pain (Nonspecific) It is often hard to give a specific diagnosis for the cause of chest pain. There is always a chance that your pain could be related to something serious, such as a heart attack or a blood clot in the lungs. You need to follow up with your caregiver for further evaluation. CAUSES   Heartburn.  Pneumonia or bronchitis.  Anxiety or stress.  Inflammation around your heart (pericarditis) or lung (pleuritis or pleurisy).  A blood clot in the lung.  A collapsed lung (pneumothorax). It can develop suddenly on its own (spontaneous pneumothorax) or from injury (trauma) to the chest.  Shingles infection (herpes zoster virus). The chest wall is composed of bones, muscles, and cartilage. Any of these can be the source of the pain.  The bones can be bruised by injury.  The muscles or cartilage can be strained by coughing or overwork.  The cartilage can be affected by inflammation and become sore (costochondritis). DIAGNOSIS  Lab tests or other studies, such as X-rays, electrocardiography, stress testing, or cardiac imaging, may be needed to find the cause of your pain.  TREATMENT   Treatment depends on what may be causing your chest pain. Treatment may include:  Acid blockers for heartburn.  Anti-inflammatory medicine.  Pain medicine for inflammatory conditions.  Antibiotics if an infection is present.  You may be advised to change lifestyle habits. This includes stopping smoking and avoiding alcohol, caffeine, and chocolate.  You may be advised to keep your head raised (elevated) when sleeping. This reduces the chance of acid going backward from your stomach into your esophagus.  Most of the time, nonspecific chest pain will improve within 2 to 3 days with rest and mild pain medicine. HOME CARE INSTRUCTIONS   If antibiotics were prescribed, take your antibiotics as directed. Finish them even if you start to feel better.  For the next few days, avoid physical  activities that bring on chest pain. Continue physical activities as directed.  Do not smoke.  Avoid drinking alcohol.  Only take over-the-counter or prescription medicine for pain, discomfort, or fever as directed by your caregiver.  Follow your caregiver's suggestions for further testing if your chest pain does not go away.  Keep any follow-up appointments you made. If you do not go to an appointment, you could develop lasting (chronic) problems with pain. If there is any problem keeping an appointment, you must call to reschedule. SEEK MEDICAL CARE IF:   You think you are having problems from the medicine you are taking. Read your medicine instructions carefully.  Your chest pain does not go away, even after treatment.  You develop a rash with blisters on your chest. SEEK IMMEDIATE MEDICAL CARE IF:   You have increased chest pain or pain that spreads to your arm, neck, jaw, back, or abdomen.  You develop shortness of breath, an increasing cough, or you are coughing up blood.  You have severe back or abdominal pain, feel nauseous, or vomit.  You develop severe weakness, fainting, or chills.  You have a fever. THIS IS AN EMERGENCY. Do not wait to see if the pain will go away. Get medical help at once. Call your local emergency services (911 in [place] Do not drive yourself to the hospital. MAKE SURE YOU:   Understand these instructions.  Will watch your condition.  Will get help right away if you are not doing well or get worse. Document Released: 08/08/2005 Document Revised: 01/21/2012 Document Reviewed: 06/03/2008 [facility] Patient Information 2014 [place],  [employer]  Migraine Headache A migraine headache is an intense, throbbing pain on one or both sides of your head. A migraine can last for 30 minutes to several hours. CAUSES  The exact cause of a migraine headache is not always known. However, a migraine may be caused when nerves in the brain become irritated and release  chemicals that cause inflammation. This causes pain. Certain things may also trigger migraines, such as:  Alcohol.  Smoking.  Stress.  Menstruation.  Aged cheeses.  Foods or drinks that contain nitrates, glutamate, aspartame, or tyramine.  Lack of sleep.  Chocolate.  Caffeine.  Hunger.  Physical exertion.  Fatigue.  Medicines used to treat chest pain (nitroglycerine), birth control pills, estrogen, and some blood pressure medicines. SIGNS AND SYMPTOMS  Pain on one or both sides of your head.  Pulsating or throbbing pain.  Severe pain that prevents daily activities.  Pain that is aggravated by any physical activity.  Nausea, vomiting, or both.  Dizziness.  Pain with exposure to bright lights, loud noises, or activity.  General sensitivity to bright lights, loud noises, or smells. Before you get a migraine, you may get warning signs that a migraine is coming (aura). An aura may include:  Seeing flashing lights.  Seeing bright spots, halos, or zig-zag lines.  Having tunnel vision or blurred vision.  Having feelings of numbness or tingling.  Having trouble talking.  Having muscle weakness. DIAGNOSIS  A migraine headache is often diagnosed based on:  Symptoms.  Physical exam.  A CT scan or MRI of your head. These imaging tests cannot diagnose migraines, but they can help rule out other causes of headaches. TREATMENT Medicines may be given for pain and nausea. Medicines can also be given to help prevent recurrent migraines.  HOME CARE INSTRUCTIONS  Only take over-the-counter or prescription medicines for pain or discomfort as directed by your health care provider. The use of long-term narcotics is not recommended.  Lie down in a dark, quiet room when you have a migraine.  Keep a journal to find out what may trigger your migraine headaches. For example, write down:  What you eat and drink.  How much sleep you get.  Any change to your diet or  medicines.  Limit alcohol consumption.  Quit smoking if you smoke.  Get 7 9 hours of sleep, or as recommended by your health care provider.  Limit stress.  Keep lights dim if bright lights bother you and make your migraines worse. SEEK IMMEDIATE MEDICAL CARE IF:   Your migraine becomes severe.  You have a fever.  You have a stiff neck.  You have vision loss.  You have muscular weakness or loss of muscle control.  You start losing your balance or have trouble walking.  You feel faint or pass out.  You have severe symptoms that are different from your first symptoms. MAKE SURE YOU:   Understand these instructions.  Will watch your condition.  Will get help right away if you are not doing well or get worse. Document Released: 10/29/2005 Document Revised: 08/19/2013 Document Reviewed: 07/06/2013 [facility] Patient Information 2014 [place], [place]. Generalized Anxiety Disorder Generalized anxiety disorder (GAD) is a mental disorder. It interferes with life functions, including relationships, work, and school. GAD is different from normal anxiety, which everyone experiences at some point in their lives in response to specific life events and activities. Normal anxiety actually helps [place] prepare for and get through these life events and activities. Normal anxiety goes away after the  event or activity is over.  GAD causes anxiety that is not necessarily related to specific events or activities. It also causes excess anxiety in proportion to specific events or activities. The anxiety associated with GAD is also difficult to control. GAD can vary from mild to severe. People with severe GAD can have intense waves of anxiety with physical symptoms (panic attacks).  SYMPTOMS The anxiety and worry associated with GAD are difficult to control. This anxiety and worry are related to many life events and activities and also occur more days than not for 6 months or longer. People with GAD also  have three or more of the following symptoms (one or more in children):  Restlessness.   Fatigue.  Difficulty concentrating.   Irritability.  Muscle tension.  Difficulty sleeping or unsatisfying sleep. DIAGNOSIS GAD is diagnosed through an assessment by your caregiver. Your caregiver will ask you questions aboutyour mood,physical symptoms, and events in your life. Your caregiver may ask you about your medical history and use of alcohol or drugs, including prescription medications. Your caregiver may also do a physical exam and blood tests. Certain medical conditions and the use of certain substances can cause symptoms similar to those associated with GAD. Your caregiver may refer you to a mental health specialist for further evaluation. TREATMENT The following therapies are usually used to treat GAD:   Medication Antidepressant medication usually is prescribed for long-term daily control. Antianxiety medications may be added in severe cases, especially when panic attacks occur.   Talk therapy (psychotherapy) Certain types of talk therapy can be helpful in treating GAD by providing support, education, and guidance. A form of talk therapy called cognitive behavioral therapy can teach you healthy ways to think about and react to daily life events and activities.  Stress managementtechniques These include yoga, meditation, and exercise and can be very helpful when they are practiced regularly. A mental health specialist can help determine which treatment is best for you. Some people see improvement with one therapy. However, other people require a combination of therapies. Document Released: 02/23/2013 Document Reviewed: 02/23/2013 [facility] Patient Information 2014 [place], [place].

## 2014-03-26 NOTE — ED Notes (Signed)
Pt arrived to the ED with a complaint of chest pain.  Pt states that she felt jaw pain that started at 1300 hrs today.  Pt then felt chest pain around 1820 hrs .  Pt describes the pain as a intermittent pressure. Pt states that she had chest pain previously that was diagnosed as a panic attack.  Pt states he is under stress due to a change in relationship status.

## 2014-03-29 DIAGNOSIS — R5381 Other malaise: Secondary | ICD-10-CM | POA: Diagnosis not present

## 2014-03-29 DIAGNOSIS — J3489 Other specified disorders of nose and nasal sinuses: Secondary | ICD-10-CM | POA: Diagnosis not present

## 2014-03-29 DIAGNOSIS — R42 Dizziness and giddiness: Secondary | ICD-10-CM | POA: Diagnosis not present

## 2014-03-29 DIAGNOSIS — R5383 Other fatigue: Secondary | ICD-10-CM | POA: Diagnosis not present

## 2014-03-30 ENCOUNTER — Ambulatory Visit: Payer: Medicare Other | Admitting: Family Medicine

## 2014-03-31 ENCOUNTER — Encounter: Payer: Self-pay | Admitting: Family Medicine

## 2014-03-31 ENCOUNTER — Ambulatory Visit (INDEPENDENT_AMBULATORY_CARE_PROVIDER_SITE_OTHER): Payer: Medicare Other | Admitting: Family Medicine

## 2014-03-31 VITALS — BP 114/60 | HR 64 | Temp 96.9°F | Resp 12 | Ht 61.25 in | Wt 127.0 lb

## 2014-03-31 DIAGNOSIS — F411 Generalized anxiety disorder: Secondary | ICD-10-CM | POA: Diagnosis not present

## 2014-03-31 DIAGNOSIS — R5383 Other fatigue: Secondary | ICD-10-CM

## 2014-03-31 DIAGNOSIS — M129 Arthropathy, unspecified: Secondary | ICD-10-CM

## 2014-03-31 DIAGNOSIS — R894 Abnormal immunological findings in specimens from other organs, systems and tissues: Secondary | ICD-10-CM | POA: Diagnosis not present

## 2014-03-31 DIAGNOSIS — R5381 Other malaise: Secondary | ICD-10-CM | POA: Diagnosis not present

## 2014-03-31 DIAGNOSIS — G43909 Migraine, unspecified, not intractable, without status migrainosus: Secondary | ICD-10-CM

## 2014-03-31 DIAGNOSIS — M1990 Unspecified osteoarthritis, unspecified site: Secondary | ICD-10-CM

## 2014-03-31 DIAGNOSIS — R5382 Chronic fatigue, unspecified: Secondary | ICD-10-CM | POA: Insufficient documentation

## 2014-03-31 DIAGNOSIS — R768 Other specified abnormal immunological findings in serum: Secondary | ICD-10-CM | POA: Insufficient documentation

## 2014-03-31 NOTE — Assessment & Plan Note (Signed)
While  she does have positive ANA she has chronic fatigue which is likely multifactorial secondary to her mental illness with PTSD bipolar depression eating disorder. I can see many reasons why she may feel fatigued

## 2014-03-31 NOTE — Assessment & Plan Note (Signed)
.   This is the cause of her recent chest pain. She has no cardiac risk factors of her own though her father did have a heart attack in his 62s. She's had cholesterol checked as well as thyroid metabolic panels EKGs which have all been normal. I think we need to go down the route of the rheumatologist secondary to her fatigue and abnormal labs.

## 2014-03-31 NOTE — Patient Instructions (Signed)
Continue imitrex New referral to rheumatology  Continue current medication  F/U as needed

## 2014-03-31 NOTE — Assessment & Plan Note (Signed)
She also notes that she missed her orthopedics appointment because she was waiting for 2 hours and had to leave to go to another appointment. She states it is with [facility] but I do not seen any recent referrals for this. I will have the front office to check into this

## 2014-03-31 NOTE — Progress Notes (Signed)
Patient ID: Regina Steele, female   DOB: Aug 08, 1981, 33 y.o.   MRN: [id number]   Subjective:    Patient ID: Regina Steele, female    DOB: 1981/04/18, 33 y.o.   MRN: [id number]  Patient presents for hospital F/U from 5/15 and fatigue  patient here to followup recent hospital evaluation. She's had chronic fatigue for the past few months. She did have a positive ANA as well as tighter and had one of her complement proteins elevated. She's been set up to see rheumatology however the visit is to reschedule multiple times by the rheumatologist office therefore she has not been seen. She also history of chronic migraines which she is followed by [facility] neurology- Glendora Score, she is on high-dose Topamax secondary to migraines as well as PTSD she takes a total of 250 mg once a day which helps typically. She also has Imitrex 100 mg at home. Over the past couple weeks she had 3 severe headaches when taking her to the emergency room. She also had some chest pain and felt short of breath when she was in the ER therefore EKG was done as well as troponin CBC metabolic panel which was unremarkable with exception of a potassium of 3.5. Of note she does have history of anxiety as well as bipolar and panic disorder but she denies having any stress or panic attacks today she went to the emergency room. She has lorazepam at home however she has not taken this recently. She's concerned that she continues to have fatigue as well as shortness of breath with minimal activities she has no cardiac or lung history.  Labs reviewed from Feb and from ER visit   Review Of Systems:  GEN- + fatigue, fever, weight loss,weakness, recent illness HEENT- denies eye drainage, change in vision, nasal discharge, CVS- denies chest pain, palpitations RESP- denies SOB, cough, wheeze ABD- denies N/V, change in stools, abd pain GU- denies dysuria, hematuria, dribbling, incontinence MSK- denies joint pain, muscle aches, injury Neuro- +  headache, dizziness, syncope, seizure activity       Objective:    BP 114/60  Pulse 64  Temp(Src) 96.9 F (36.1 C) (Oral)  Resp 12  Ht 5' 1.25" (1.556 m)  Wt 127 lb (57.607 kg)  BMI 23.79 kg/m2  LMP 12/14/2003 GEN- NAD, alert and oriented x3 HEENT- PERRL, EOMI, non injected sclera, pink conjunctiva, MMM, oropharynx clear, fundus benign, TM clear no effusion, nares clear  Neck- Supple, no thyromegaly CVS- RRR, no murmur RESP-CTAB EXT- No edema Neuro-CNII-XII in tact, no focal deficits Psych- flat affect, not anxious appearing, fair eye contact, well groomed Pulses- Radial, DP- 2+        Assessment & Plan:      Problem List Items Addressed This Visit   None      Note: This dictation was prepared with Dragon dictation along with smaller phrase technology. Any transcriptional errors that result from this process are unintentional.

## 2014-03-31 NOTE — Assessment & Plan Note (Signed)
Her migraine has now resolved and she's been to the ER. She's getting occipital blocks by her neurologist if her advise her to return to them for further treatment and she has severe migraines. She will continue the Topamax at the current dose in the Imitrex

## 2014-04-02 ENCOUNTER — Encounter: Payer: Self-pay | Admitting: Family Medicine

## 2014-04-02 ENCOUNTER — Ambulatory Visit (INDEPENDENT_AMBULATORY_CARE_PROVIDER_SITE_OTHER): Payer: Medicare Other | Admitting: Family Medicine

## 2014-04-02 ENCOUNTER — Telehealth: Payer: Self-pay | Admitting: *Deleted

## 2014-04-02 VITALS — BP 112/68 | HR 88 | Temp 97.8°F | Resp 16 | Ht 60.0 in | Wt 126.0 lb

## 2014-04-02 DIAGNOSIS — M549 Dorsalgia, unspecified: Secondary | ICD-10-CM | POA: Diagnosis not present

## 2014-04-02 MED ORDER — PREDNISONE 10 MG PO TABS: ORAL_TABLET | ORAL | Status: DC

## 2014-04-02 MED ORDER — KETOROLAC TROMETHAMINE 60 MG/2ML IM SOLN
30.0000 mg | Freq: Once | INTRAMUSCULAR | Status: AC
  Administered 2014-04-02: 30 mg via INTRAMUSCULAR

## 2014-04-02 NOTE — Patient Instructions (Signed)
Start prednisone pak today Toradol shot given F/U as previous

## 2014-04-02 NOTE — Progress Notes (Signed)
Patient ID: Regina Steele, female   DOB: [birth year], 33 y.o.   MRN: [id number]   Subjective:    Patient ID: Regina Steele, female    DOB: 1981-06-19, 33 y.o.   MRN: [id number]  Patient presents for back pain  is here with back pain started yesterday. States that she has a history of back pain on and off. It typically occurs when she gets flares of her other joint pains in her migraines. She states she was so stiff she was unable to get out of bed this morning she had pain across her shoulders as well as her lower back. No specific injury. She brings me a followup prednisone taper which she is using the past for joint pains and back pain and has helped. Of note I just saw her recently she does have rheumatology appointment pending. She denies any paresthesias in her lower extremities no change in bowel or bladder.    Review Of Systems:  GEN- denies fatigue, fever, weight loss,weakness, recent illness HEENT- denies eye drainage, change in vision, nasal discharge, CVS- denies chest pain, palpitations RESP- denies SOB, cough, wheeze ABD- denies N/V, change in stools, abd pain GU- denies dysuria, hematuria, dribbling, incontinence MSK- + joint pain, muscle aches, injury Neuro- denies headache, dizziness, syncope, seizure activity       Objective:    BP 112/68  Pulse 88  Temp(Src) 97.8 F (36.6 C) (Oral)  Resp 16  Ht 5' (1.524 m)  Wt 126 lb (57.153 kg)  BMI 24.61 kg/m2  LMP 12/14/2003 GEN- NAD, alert and oriented x3 MSK= TTP lumbar spine, + mild spasm in cervical parapsinals, neg SLR, decreased flexion/extension in lower spine, Neck good ROM Neuro- DTR symmetric, normal tone, strength equal bilat 5/5  EXT- No edema Psych- flat affect, not anxious appearing, fair eye contact, well groomed Pulses- Radial, DP- 2      Assessment & Plan:      Problem List Items Addressed This Visit   None    Visit Diagnoses   Back pain    -  Primary    I think this is muscle skeletal  pain this may be related to her arthritis which she is positive ANA factors which is being worked up. Other possibility is fibromyalgia, I have given her a shot of toradol, prednisone taper also given F/U rheumatology    Relevant Medications       predniSONE (DELTASONE) tablet       ketorolac (TORADOL) injection 30 mg (Completed)       Note: This dictation was prepared with Dragon dictation along with smaller phrase technology. Any transcriptional errors that result from this process are unintentional.

## 2014-04-02 NOTE — Telephone Encounter (Signed)
Message copied by Phillips Odor on Fri Apr 02, 2014  8:28 AM ------      Message from: Malvin Johns      Created: Fri Apr 02, 2014  8:11 AM       [phone]      Having a lot of pain today says she cannot stand up because of her back pain and she cannot drive because of it please call her, she would like to know if she could maybe get a prescription for her pain so she can take of her children this weekend  ------

## 2014-04-02 NOTE — Telephone Encounter (Signed)
noted 

## 2014-04-02 NOTE — Telephone Encounter (Signed)
Call returned to patient. Reports that her pain in her back is severe and requested prescription.   Advised that patient has not been seen for pain in our clinic and that before prescription could be started, patient would need to be seen.   Requested visit with Dr. Jeanice Lim. Appointment scheduled.

## 2014-04-03 ENCOUNTER — Emergency Department (HOSPITAL_COMMUNITY)
Admission: EM | Admit: 2014-04-03 | Discharge: 2014-04-03 | Disposition: A | Discharge status: Home / Self Care | Payer: Medicare Other | Attending: Emergency Medicine | Admitting: Emergency Medicine

## 2014-04-03 ENCOUNTER — Encounter (HOSPITAL_COMMUNITY): Payer: Self-pay | Admitting: Emergency Medicine

## 2014-04-03 DIAGNOSIS — Z791 Long term (current) use of non-steroidal anti-inflammatories (NSAID): Secondary | ICD-10-CM | POA: Insufficient documentation

## 2014-04-03 DIAGNOSIS — R51 Headache: Secondary | ICD-10-CM

## 2014-04-03 DIAGNOSIS — Z792 Long term (current) use of antibiotics: Secondary | ICD-10-CM | POA: Insufficient documentation

## 2014-04-03 DIAGNOSIS — F3289 Other specified depressive episodes: Secondary | ICD-10-CM | POA: Insufficient documentation

## 2014-04-03 DIAGNOSIS — Z8659 Personal history of other mental and behavioral disorders: Secondary | ICD-10-CM | POA: Insufficient documentation

## 2014-04-03 DIAGNOSIS — K5900 Constipation, unspecified: Secondary | ICD-10-CM | POA: Insufficient documentation

## 2014-04-03 DIAGNOSIS — Z79899 Other long term (current) drug therapy: Secondary | ICD-10-CM | POA: Diagnosis not present

## 2014-04-03 DIAGNOSIS — F329 Major depressive disorder, single episode, unspecified: Secondary | ICD-10-CM | POA: Diagnosis not present

## 2014-04-03 DIAGNOSIS — G8929 Other chronic pain: Secondary | ICD-10-CM | POA: Insufficient documentation

## 2014-04-03 DIAGNOSIS — Z8739 Personal history of other diseases of the musculoskeletal system and connective tissue: Secondary | ICD-10-CM | POA: Insufficient documentation

## 2014-04-03 DIAGNOSIS — Z8719 Personal history of other diseases of the digestive system: Secondary | ICD-10-CM | POA: Insufficient documentation

## 2014-04-03 DIAGNOSIS — F411 Generalized anxiety disorder: Secondary | ICD-10-CM | POA: Diagnosis not present

## 2014-04-03 DIAGNOSIS — G43909 Migraine, unspecified, not intractable, without status migrainosus: Secondary | ICD-10-CM | POA: Diagnosis not present

## 2014-04-03 DIAGNOSIS — R519 Headache, unspecified: Secondary | ICD-10-CM

## 2014-04-03 MED ORDER — SUMATRIPTAN SUCCINATE 6 MG/0.5ML ~~LOC~~ SOLN
6.0000 mg | Freq: Once | SUBCUTANEOUS | Status: AC
  Administered 2014-04-03: 6 mg via SUBCUTANEOUS
  Filled 2014-04-03: qty 0.5

## 2014-04-03 MED ORDER — SUMATRIPTAN SUCCINATE 100 MG PO TABS: 100.0000 mg | ORAL_TABLET | ORAL | Status: DC | PRN

## 2014-04-03 NOTE — ED Notes (Signed)
She c/o recalcitrant migraine h/a, for which she has seen her pcp for past two days.  She c/o occipital area h/a.  She further c/o having frequent "panic attacks".  She is in no distress and is somewhat anxious in appearance.

## 2014-04-03 NOTE — ED Provider Notes (Signed)
CSN: [id number]     Arrival date & time 04/03/14  1720 History   First MD Initiated Contact with Patient 04/03/14 1732     Chief Complaint  Patient presents with  . Migraine     (Consider location/radiation/quality/duration/timing/severity/associated sxs/prior Treatment) Patient is a 33 y.o. female presenting with migraines. The history is provided by the patient (the pt complains of a headache).  Migraine This is a new problem. The current episode started 12 to 24 hours ago. The problem occurs constantly. The problem has not changed since onset.Associated symptoms include headaches. Pertinent negatives include no chest pain and no abdominal pain. Nothing aggravates the symptoms. Nothing relieves the symptoms.    Past Medical History  Diagnosis Date  . PTSD (post-traumatic stress disorder)   . Uterine prolapse 2013  . Depression   . Hiatal hernia   . Surgical menopause   . Chronic headache   . Constipation   . Esophageal reflux     no meds  . Osteopenia   . Eating disorder   . Anxiety   . Osteoporosis    Past Surgical History  Procedure Laterality Date  . Bilateral oophorectomy      bilat  . Rectocele repair    . Cesarean section    . Abdominal hysterectomy     Family History  Problem Relation Age of Onset  . Breast cancer Maternal Grandmother 56  . Breast cancer Paternal Aunt 40  . Celiac disease Paternal Grandmother   . Heart disease Father   . Colon cancer Neg Hx   . Mental illness Mother   . Bipolar disorder Maternal Grandmother   . Mental illness Brother    History  Substance Use Topics  . Smoking status: Never Smoker   . Smokeless tobacco: Never Used  . Alcohol Use: No   OB History   Grav Para Term Preterm Abortions TAB SAB Ect Mult Living   3 3 3       3      Review of Systems  Constitutional: Negative for appetite change and fatigue.  HENT: Negative for congestion, ear discharge and sinus pressure.   Eyes: Negative for discharge.  Respiratory:  Negative for cough.   Cardiovascular: Negative for chest pain.  Gastrointestinal: Negative for abdominal pain and diarrhea.  Genitourinary: Negative for frequency and hematuria.  Musculoskeletal: Negative for back pain.  Skin: Negative for rash.  Neurological: Positive for headaches. Negative for seizures.  Psychiatric/Behavioral: Negative for hallucinations.      Allergies  Gluten meal  Home Medications   Prior to Admission medications   Medication Sig Start Date End Date Taking? Authorizing Provider  buPROPion (WELLBUTRIN XL) 150 MG 24 hr tablet Take 1 tablet (150 mg total) by mouth every morning. 03/24/14   03/26/14, MD  cetirizine (ZYRTEC) 10 MG tablet Take 10 mg by mouth daily.    Historical Provider, MD  docusate sodium (COLACE) 100 MG capsule Take 2 capsules (200 mg total) by mouth 2 (two) times daily. For constipation 07/13/13   09/12/13, NP  escitalopram (LEXAPRO) 20 MG tablet Take 1 tablet (20 mg total) by mouth daily. 03/24/14   03/26/14, MD  estradiol (ESTRACE) 2 MG tablet Take 2 mg by mouth daily. For hormone replacement (Low estrogen) 07/13/13   09/12/13, NP  Estradiol (VAGIFEM) 10 MCG TABS vaginal tablet Place 1 tablet (10 mcg total) vaginally 2 (two) times a week. On Mondays and Thursdays: For Vaginal atrophy 07/13/13   09/12/13  I Nwoko, NP  fluticasone (FLONASE) 50 MCG/ACT nasal spray Place 2 sprays into the nose daily as needed for allergies or rhinitis.    Historical Provider, MD  ibandronate (BONIVA) 150 MG tablet Take 150 mg by mouth every 30 (thirty) days. Take in the morning with a full glass of water, on an empty stomach, and do not take anything else by mouth or lie down for the next 30 min.    Historical Provider, MD  ibuprofen (ADVIL,MOTRIN) 200 MG tablet Take 1 tablet (200 mg total) by mouth as needed for pain. 07/13/13   Sanjuana KavaAgnes I Nwoko, NP  Linaclotide Karlene Einstein(LINZESS) 145 MCG CAPS capsule Take 290 mcg by mouth daily.  11/02/13   Mardella Laymanavid R Patterson, MD   LORazepam (ATIVAN) 1 MG tablet Take 1 mg by mouth daily as needed for anxiety.    Historical Provider, MD  minocycline (MINOCIN,DYNACIN) 50 MG capsule Take 1 capsule (50 mg total) by mouth 2 (two) times daily. 01/29/14   Donita BrooksWarren T Pickard, MD  mirtazapine (REMERON) 15 MG tablet Take 1 tablet (15 mg total) by mouth at bedtime. 03/24/14 03/24/15  Cleotis NipperSyed T Arfeen, MD  naproxen sodium (ANAPROX) 220 MG tablet Take 1 tablet (220 mg total) by mouth 2 (two) times daily with a meal. 11/18/13   Dorena BodoMary B Dixon, PA-C  predniSONE (DELTASONE) 10 MG tablet Take 40mg  x 3 days,20mg  x 3 days, 10mg  x 3 days 04/02/14   Salley ScarletKawanta F Everton, MD  risperiDONE (RISPERDAL) 2 MG tablet Take 1 tablet (2 mg total) by mouth 2 (two) times daily. 03/24/14   Cleotis NipperSyed T Arfeen, MD  SUMAtriptan (IMITREX) 100 MG tablet Take 100 mg by mouth every 2 (two) hours as needed for migraine or headache. May repeat in 2 hours if headache persists or recurs.    Historical Provider, MD  SUMAtriptan (IMITREX) 100 MG tablet Take 1 tablet (100 mg total) by mouth every 2 (two) hours as needed for migraine or headache. May repeat in 2 hours if headache persists or recurs. 04/03/14   Benny LennertJoseph L Suzanna Zahn, MD  topiramate (TOPAMAX) 100 MG tablet Take 100-150 mg by mouth See admin instructions. 100mg  in am, 150mg  in pm    Historical Provider, MD   BP 112/67  Pulse 82  Temp(Src) 98.3 F (36.8 C) (Oral)  Resp 18  SpO2 99%  LMP 12/14/2003 Physical Exam  Constitutional: She is oriented to person, place, and time. She appears well-developed.  HENT:  Head: Normocephalic.  Eyes: Conjunctivae and EOM are normal. No scleral icterus.  Neck: Neck supple. No thyromegaly present.  Cardiovascular: Normal rate and regular rhythm.  Exam reveals no gallop and no friction rub.   No murmur heard. Pulmonary/Chest: No stridor. She has no wheezes. She has no rales. She exhibits no tenderness.  Abdominal: She exhibits no distension. There is no tenderness. There is no rebound.   Musculoskeletal: Normal range of motion. She exhibits no edema.  Lymphadenopathy:    She has no cervical adenopathy.  Neurological: She is oriented to person, place, and time. She exhibits normal muscle tone. Coordination normal.  Skin: No rash noted. No erythema.  Psychiatric: She has a normal mood and affect. Her behavior is normal.    ED Course  Procedures (including critical care time) Labs Review Labs Reviewed - No data to display  Imaging Review No results found.   EKG Interpretation None      MDM   Final diagnoses:  Headache    Pt improved with tx  Benny Lennert, MD 04/03/14 [phone]

## 2014-04-03 NOTE — ED Notes (Signed)
Patient is from home. Patient states she has been having HA located in the back of her head for a while now. Patient states she also had an panic attack around 1400. Patient states she has had a lot of stress in her life the past few weeks. She took ativan at 1400 and 1600, which are 1 mg. Patient states she felt worst after medication. States she was having trouble breathing, afraid of noise, and hard to move. Patient denies taking medication for HA. Patient is seen by Neuro doctor but our of her meds.

## 2014-04-03 NOTE — Discharge Instructions (Signed)
Follow up with your md as needed °

## 2014-04-05 ENCOUNTER — Encounter: Payer: Self-pay | Admitting: *Deleted

## 2014-04-06 ENCOUNTER — Ambulatory Visit (HOSPITAL_COMMUNITY): Payer: Self-pay | Admitting: Psychiatry

## 2014-04-08 ENCOUNTER — Ambulatory Visit: Payer: Self-pay | Admitting: *Deleted

## 2014-04-08 ENCOUNTER — Telehealth: Payer: Self-pay | Admitting: *Deleted

## 2014-04-08 DIAGNOSIS — Z1211 Encounter for screening for malignant neoplasm of colon: Secondary | ICD-10-CM

## 2014-04-08 NOTE — Telephone Encounter (Signed)
Message copied by Samuella Cota on Thu Apr 08, 2014 11:26 AM ------      Message from: Harriet Masson      Created: Wed Apr 07, 2014  3:27 PM      Regarding: referral       Contact: [phone]       Needs a referral to GI dr. She was going to one and they do not accept her insurance  ------

## 2014-04-08 NOTE — Telephone Encounter (Signed)
Called pt back and she stated that she needed a referral to GI at [facility] d/t her last doctor does not accept Medicaid, referral placed for [facility]

## 2014-04-09 ENCOUNTER — Other Ambulatory Visit: Payer: Self-pay | Admitting: Family Medicine

## 2014-04-09 ENCOUNTER — Telehealth: Payer: Self-pay | Admitting: Gastroenterology

## 2014-04-09 DIAGNOSIS — K219 Gastro-esophageal reflux disease without esophagitis: Secondary | ICD-10-CM

## 2014-04-09 DIAGNOSIS — K3189 Other diseases of stomach and duodenum: Secondary | ICD-10-CM

## 2014-04-09 DIAGNOSIS — K5909 Other constipation: Secondary | ICD-10-CM

## 2014-04-12 MED ORDER — LINACLOTIDE 145 MCG PO CAPS: 145.0000 ug | ORAL_CAPSULE | Freq: Every day | ORAL | Status: DC

## 2014-04-12 NOTE — Telephone Encounter (Signed)
Okay to refill? 

## 2014-04-12 NOTE — Telephone Encounter (Signed)
Rx sent 

## 2014-04-12 NOTE — Telephone Encounter (Signed)
Patient advised that she needs an office visit as Dr Jarold Motto has retired and she has not been seen in 1 year. Patient has scheduled to see Dr Marina Goodell in the office on 05/25/14. Dr Marina Goodell, may I refill Linzess 145 mcg until office visit with you?

## 2014-04-14 ENCOUNTER — Encounter: Payer: Self-pay | Admitting: Family Medicine

## 2014-04-15 DIAGNOSIS — J3089 Other allergic rhinitis: Secondary | ICD-10-CM | POA: Diagnosis not present

## 2014-04-15 DIAGNOSIS — J309 Allergic rhinitis, unspecified: Secondary | ICD-10-CM | POA: Diagnosis not present

## 2014-04-15 DIAGNOSIS — J301 Allergic rhinitis due to pollen: Secondary | ICD-10-CM | POA: Diagnosis not present

## 2014-04-15 DIAGNOSIS — J3081 Allergic rhinitis due to animal (cat) (dog) hair and dander: Secondary | ICD-10-CM | POA: Diagnosis not present

## 2014-04-18 ENCOUNTER — Emergency Department (HOSPITAL_COMMUNITY)
Admission: EM | Admit: 2014-04-18 | Discharge: 2014-04-18 | Disposition: A | Discharge status: Home / Self Care | Payer: Medicare Other | Attending: Emergency Medicine | Admitting: Emergency Medicine

## 2014-04-18 ENCOUNTER — Encounter (HOSPITAL_COMMUNITY): Payer: Self-pay | Admitting: Emergency Medicine

## 2014-04-18 DIAGNOSIS — G8929 Other chronic pain: Secondary | ICD-10-CM | POA: Insufficient documentation

## 2014-04-18 DIAGNOSIS — R519 Headache, unspecified: Secondary | ICD-10-CM

## 2014-04-18 DIAGNOSIS — F329 Major depressive disorder, single episode, unspecified: Secondary | ICD-10-CM | POA: Insufficient documentation

## 2014-04-18 DIAGNOSIS — IMO0002 Reserved for concepts with insufficient information to code with codable children: Secondary | ICD-10-CM | POA: Diagnosis not present

## 2014-04-18 DIAGNOSIS — M25579 Pain in unspecified ankle and joints of unspecified foot: Secondary | ICD-10-CM | POA: Insufficient documentation

## 2014-04-18 DIAGNOSIS — R42 Dizziness and giddiness: Secondary | ICD-10-CM | POA: Insufficient documentation

## 2014-04-18 DIAGNOSIS — F3289 Other specified depressive episodes: Secondary | ICD-10-CM | POA: Diagnosis not present

## 2014-04-18 DIAGNOSIS — Z8742 Personal history of other diseases of the female genital tract: Secondary | ICD-10-CM | POA: Insufficient documentation

## 2014-04-18 DIAGNOSIS — Z8719 Personal history of other diseases of the digestive system: Secondary | ICD-10-CM | POA: Diagnosis not present

## 2014-04-18 DIAGNOSIS — M549 Dorsalgia, unspecified: Secondary | ICD-10-CM | POA: Diagnosis not present

## 2014-04-18 DIAGNOSIS — F411 Generalized anxiety disorder: Secondary | ICD-10-CM | POA: Diagnosis not present

## 2014-04-18 DIAGNOSIS — M542 Cervicalgia: Secondary | ICD-10-CM | POA: Diagnosis not present

## 2014-04-18 DIAGNOSIS — Z79899 Other long term (current) drug therapy: Secondary | ICD-10-CM | POA: Insufficient documentation

## 2014-04-18 DIAGNOSIS — M2559 Pain in other specified joint: Secondary | ICD-10-CM | POA: Diagnosis not present

## 2014-04-18 DIAGNOSIS — M2550 Pain in unspecified joint: Secondary | ICD-10-CM

## 2014-04-18 DIAGNOSIS — R51 Headache: Secondary | ICD-10-CM | POA: Insufficient documentation

## 2014-04-18 DIAGNOSIS — F4310 Post-traumatic stress disorder, unspecified: Secondary | ICD-10-CM | POA: Diagnosis not present

## 2014-04-18 DIAGNOSIS — Z3202 Encounter for pregnancy test, result negative: Secondary | ICD-10-CM | POA: Diagnosis not present

## 2014-04-18 DIAGNOSIS — Z791 Long term (current) use of non-steroidal anti-inflammatories (NSAID): Secondary | ICD-10-CM | POA: Diagnosis not present

## 2014-04-18 DIAGNOSIS — M25569 Pain in unspecified knee: Secondary | ICD-10-CM | POA: Insufficient documentation

## 2014-04-18 LAB — BASIC METABOLIC PANEL
BUN: 16 mg/dL (ref 6–23)
CO2: 22 mEq/L (ref 19–32)
Calcium: 9.1 mg/dL (ref 8.4–10.5)
Chloride: 105 mEq/L (ref 96–112)
Creatinine, Ser: 0.88 mg/dL (ref 0.50–1.10)
GFR calc Af Amer: >90 mL/min (ref >90)
GFR calc non Af Amer: 85 mL/min — ABNORMAL LOW (ref >90)
Glucose, Bld: 102 mg/dL — ABNORMAL HIGH (ref 70–99)
POTASSIUM: 4.3 meq/L (ref 3.7–5.3)
SODIUM: 140 meq/L (ref 137–147)

## 2014-04-18 LAB — URINALYSIS, ROUTINE W REFLEX MICROSCOPIC
Bilirubin Urine: NEGATIVE
Glucose, UA: NEGATIVE mg/dL
Hgb urine dipstick: NEGATIVE
Ketones, ur: NEGATIVE mg/dL
Leukocytes, UA: NEGATIVE
NITRITE: NEGATIVE
Protein, ur: NEGATIVE mg/dL
SPECIFIC GRAVITY, URINE: 1.001 — AB (ref 1.005–1.030)
Urobilinogen, UA: 0.2 mg/dL (ref 0.0–1.0)
pH: 6 (ref 5.0–8.0)

## 2014-04-18 LAB — CBC
HCT: 40.4 % (ref 36.0–46.0)
Hemoglobin: 13.4 g/dL (ref 12.0–15.0)
MCH: 30.4 pg (ref 26.0–34.0)
MCHC: 33.2 g/dL (ref 30.0–36.0)
MCV: 91.6 fL (ref 78.0–100.0)
PLATELETS: 204 10*3/uL (ref 150–400)
RBC: 4.41 MIL/uL (ref 3.87–5.11)
RDW: 12.3 % (ref 11.5–15.5)
WBC: 6.3 10*3/uL (ref 4.0–10.5)

## 2014-04-18 LAB — POC URINE PREG, ED: Preg Test, Ur: NEGATIVE

## 2014-04-18 MED ORDER — ACETAMINOPHEN 325 MG PO TABS
650.0000 mg | ORAL_TABLET | Freq: Once | ORAL | Status: AC
  Administered 2014-04-18: 650 mg via ORAL
  Filled 2014-04-18: qty 2

## 2014-04-18 MED ORDER — SODIUM CHLORIDE 0.9 % IV BOLUS (SEPSIS)
1000.0000 mL | Freq: Once | INTRAVENOUS | Status: AC
  Administered 2014-04-18: 1000 mL via INTRAVENOUS

## 2014-04-18 MED ORDER — PREDNISONE 20 MG PO TABS: ORAL_TABLET | ORAL | Status: DC

## 2014-04-18 NOTE — Discharge Instructions (Signed)
Please call your doctor for a followup appointment within 24-48 hours. When you talk to your doctor please let them know that you were seen in the emergency department and have them acquire all of your records so that they can discuss the findings with you and formulate a treatment plan to fully care for your new and ongoing problems. Please call and set-up an appointment with your primary care provider to be seen and re-assessed this week  Please call and set-up an appointment with Rheumatologist  Please take medication as prescribed Please avoid any physical strenuous activity Please continue to monitor symptoms closely and if symptoms are to worsen or change (fever greater than 101, chills, chest pain, shortness of breath, difficulty breathing, numbness, tingling, worsening or changes to pain pattern, swelling to the joints, redness to the joints, hot to the touch, fall, injury, weakness) please continue to monitor symptoms closely   Arthralgia Your caregiver has diagnosed you as suffering from an arthralgia. Arthralgia means there is pain in a joint. This can come from many reasons including:  Bruising the joint which causes soreness (inflammation) in the joint.  Wear and tear on the joints which occur as we grow older (osteoarthritis).  Overusing the joint.  Various forms of arthritis.  Infections of the joint. Regardless of the cause of pain in your joint, most of these different pains respond to anti-inflammatory drugs and rest. The exception to this is when a joint is infected, and these cases are treated with antibiotics, if it is a bacterial infection. HOME CARE INSTRUCTIONS   Rest the injured area for as long as directed by your caregiver. Then slowly start using the joint as directed by your caregiver and as the pain allows. Crutches as directed may be useful if the ankles, knees or hips are involved. If the knee was splinted or casted, continue use and care as directed. If an  stretchy or elastic wrapping bandage has been applied today, it should be removed and re-applied every 3 to 4 hours. It should not be applied tightly, but firmly enough to keep swelling down. Watch toes and feet for swelling, bluish discoloration, coldness, numbness or excessive pain. If any of these problems (symptoms) occur, remove the ace bandage and re-apply more loosely. If these symptoms persist, contact your caregiver or return to this location.  For the first 24 hours, keep the injured extremity elevated on pillows while lying down.  Apply ice for 15-20 minutes to the sore joint every couple hours while awake for the first half day. Then 03-04 times per day for the first 48 hours. Put the ice in a plastic bag and place a towel between the bag of ice and your skin.  Wear any splinting, casting, elastic bandage applications, or slings as instructed.  Only take over-the-counter or prescription medicines for pain, discomfort, or fever as directed by your caregiver. Do not use aspirin immediately after the injury unless instructed by your physician. Aspirin can cause increased bleeding and bruising of the tissues.  If you were given crutches, continue to use them as instructed and do not resume weight bearing on the sore joint until instructed. Persistent pain and inability to use the sore joint as directed for more than 2 to 3 days are warning signs indicating that you should see a caregiver for a follow-up visit as soon as possible. Initially, a hairline fracture (break in bone) may not be evident on X-rays. Persistent pain and swelling indicate that further evaluation, non-weight bearing  or use of the joint (use of crutches or slings as instructed), or further X-rays are indicated. X-rays may sometimes not show a small fracture until a week or 10 days later. Make a follow-up appointment with your own caregiver or one to whom we have referred you. A radiologist (specialist in reading X-rays) may read  your X-rays. Make sure you know how you are to obtain your X-ray results. Do not assume everything is normal if you do not hear from [place]. SEEK MEDICAL CARE IF: Bruising, swelling, or pain increases. SEEK IMMEDIATE MEDICAL CARE IF:   Your fingers or toes are numb or blue.  The pain is not responding to medications and continues to stay the same or get worse.  The pain in your joint becomes severe.  You develop a fever over 102 F (38.9 C).  It becomes impossible to move or use the joint. MAKE SURE YOU:   Understand these instructions.  Will watch your condition.  Will get help right away if you are not doing well or get worse. Document Released: 10/29/2005 Document Revised: 01/21/2012 Document Reviewed: 06/16/2008 [facility] Patient Information 2014 [place], [place].   Emergency Department Resource Guide 1) Find a Doctor and Pay Out of Pocket Although you won't have to find out who is covered by your insurance plan, it is a good idea to ask around and get recommendations. You will then need to call the office and see if the doctor you have chosen will accept you as a new patient and what types of options they offer for patients who are self-pay. Some doctors offer discounts or will set up payment plans for their patients who do not have insurance, but you will need to ask so you aren't surprised when you get to your appointment.  2) Contact Your Local Health Department Not all health departments have doctors that can see patients for sick visits, but many do, so it is worth a call to see if yours does. If you don't know where your local health department is, you can check in your phone book. The CDC also has a tool to help you locate your state's health department, and many state websites also have listings of all of their local health departments.  3) Find a Walk-in Clinic If your illness is not likely to be very severe or complicated, you may want to try a walk in clinic. These are  popping up all over the country in pharmacies, drugstores, and shopping centers. They're usually staffed by nurse practitioners or physician assistants that have been trained to treat common illnesses and complaints. They're usually fairly quick and inexpensive. However, if you have serious medical issues or chronic medical problems, these are probably not your best option.  No Primary Care Doctor: - Call Health Connect at  [phone] - they can help you locate a primary care doctor that  accepts your insurance, provides certain services, etc. - Physician Referral Service- [phone]  Chronic Pain Problems: Organization         Address  Phone   Notes  [facility]  [phone] Patients need to be referred by their primary care doctor.   Medication Assistance: Organization         Address  Phone   Notes  [facility] Medication [facility] [address] [place] [place], [place] [address] [phone] --Must be a resident of [facility] -- Must have NO insurance coverage whatsoever (no Medicaid/ Medicare, etc.) -- The pt.  MUST have a primary care doctor that directs their care regularly and follows them in the community   Med[facility]ist  [phone]   [employer]  [phone]) [phone]    Agencies that provide inexpensive medical care: Organization         Address  Phone   Notes  [facility]  [phone]) [phone]   [facility]    [phone]) [phone]   [facility] [address] [place], [place] [address] [phone]) [phone]   [facility] [address], [place] [phone]) [phone]   [facility]    [phone]   [facility]    [phone]   [facility]  [address], [place] Phone:  [phone], Fax:  [phone] Hours of Operation:  9 am - 6 pm, M-F.  Also accepts Medicaid/Medicare and self-pay.  [facility]  [address], [place] Phone: [phone], Fax: [phone]) [phone]. Hours of Operation:  8:30 am - 5:30 pm, M-F.  Also accepts Medicaid and self-pay.  [facility] [place] [address], [place] Phone: [phone](336) [phone]   [facility] [address], [place], [place] [phone], Ext. [phone] Mondays & Thursdays: 7-9 AM.  First 15 patients are seen on a first come, first serve basis.    Medicaid-accepting [facility] Providers:  Organization         Address  Phone   Notes  [facility] [address], [place] [phone] Also accepts self-pay patients.  [facility] [address], [place]  [phone]) [phone]   [facility] [address], [place] [phone]) [phone]   [facility] [address] [place] [address], [place] [phone]) [phone]   Renaye RakersVeita Bland [address], [place]   [phone] Only accepts [place] Access [place] patients after they have their name applied to their card.   Self-Pay (no insurance) in [facility]:  Organization         Address  Phone   Notes  Sickle Cell Patients, [facility] [address], [place] [phone]) [phone]   [facility] [address] [place], [place] [phone]) [phone]   [facility] [place]  [address], [place] [phone]   [facility]/Dr. Osei-Bonsu  [address] [place] [address], [place] or [phone], [place] [phone] Phone number for both [place] and [place] locations is the same.  [facility] [address], [place] [phone]   [facility] [address] [place] [address], [place] or [address] [phone]   [facility] [address], [place] [phone](336) [phone], phone; [phone]) [phone], fax Sees patients 1st and 3rd Saturday  of every month.  Must not qualify for public or private insurance (i.e. Medicaid, Medicare, [place] Health Choice, Veterans' Benefits)  Household income should be no more than 200% of the poverty level The clinic cannot treat you if you are pregnant or think you are pregnant  Sexually transmitted diseases are not treated at the clinic.    Dental Care: Organization         Address  Phone  Notes  [facility] [address]  Lynne Logan [phone] Accepts children up to age 78 who are enrolled in Medicaid or [place] Health Choice; pregnant women with a Medicaid card; and children who have applied for Medicaid or [place] Health Choice, but were declined, whose parents can pay a reduced fee at time of service.  [facility]  [address], [place] [phone] Accepts children up to age 65 who are enrolled in [place] or [place] Health Choice; pregnant women with a Medicaid card; and children who have applied for Medicaid or [place] Health Choice, but were declined, whose parents can pay a reduced fee at time of service.  [facility]  [address] [place], [place] [phone] Patients are seen by appointment only. Walk-ins are not accepted. [facility] will see patients 47 years of age and older. Monday - Tuesday (8am-5pm) Most Wednesdays (8:30-5pm) $30 per visit, cash only  [facility] Adult Dental Access PRO[facility]  [address], [facility] [phone] Patients are seen by appointment only. Walk-ins are not accepted. [facility] will see patients 21 years of age and older. One Wednesday Evening (Monthly: Volunteer Based).  $30 per visit, cash only  [employer]  [phone] for adults; Children under age 8, call Graduate Pediatric Dentistry at [phone]. Children aged 6-14, please call [phone] to request a pediatric application.   Dental services are provided in all areas of dental care including fillings, crowns and bridges, complete and partial dentures, implants, gum treatment, root canals, and extractions. Preventive care is also provided. Treatment is provided to both adults and children. Patients are selected via a lottery and there is often a waiting list.   [facility] [address] [place]  [phone] [email or web address]   [facility] [address] [place], [place] [phone], Ext. [phone] Second and Fourth Thursday of each month, opens at 6:30 AM; Clinic ends at 9 AM.  Patients are seen on a first-come first-served basis, and a limited number are seen during each clinic.   [facility]  [address] [place], [place] [phone]   Eligibility Requirements You must have lived in [place], [place], or [place] counties for at least the last three months.   You cannot be eligible for state or federal sponsored [place], including [employer], [place], or [employer].   You generally cannot be eligible for healthcare insurance through your employer.    How to apply: Eligibility screenings are held every Tuesday and Wednesday afternoon from 1:00 pm until 4:00 pm. You do not need an appointment for the interview!  [facility] [address], [place], [place] [phone]   [facility] Health Department  [phone]   [facility]  [phone]   [facility]  [phone]    Behavioral Health Resources in the Community: Intensive Outpatient Programs Organization         Address  Phone  Notes  [facility] [address], [place], [place] [phone]   [facility] Outpatient [address], [place], [place] [phone]   [facility] [address] [place], [place]  [phone]   [facility] [address],  [place], [place] [phone] or [phone]   Substance Abuse Resources Organization         Address  Phone  Notes  Alcohol and Drug  Services  [phone]   [facility]  [phone]   [facility]  [phone]   Floydene Flock  [phone]   Residential & Outpatient Substance Abuse Program  [phone]   Psychological Services Organization         Address  Phone  Notes  [facility]  [phone]   [facility]  [phone]   [facility] [address] [place] [phone] or [phone]    Mobile Crisis Teams Organization         Address  Phone  Notes  [facility], Mobile Crisis Care Unit  [phone]   [facility]  [address] [place], [place] [phone]   Doristine Locks [address] [place] [phone]    Self-Help/Support Groups Organization         Address  Phone             Notes  [facility] [place] - variety of support groups  [phone]- I7437963 Call for more information  Narcotics Anonymous (NA), [facility] [address], [employer] [place]  2 meetings at this location   Statistician         Address  Phone  Notes  [facility] [address],    [place]  [phone]   [facility]  [address], [place] [address], [place], [place] [phone]   [facility] [address] [place], [place] [phone] Admissions: 8am-3pm M-F  [facility] [address]    [place], [place] [phone]   [facility] [address] [place], [place], [place] [phone]   [facility] [address],  [place], [place] [phone]   [facility] - Intensive Outpatient [address], [place], [place] [phone]   [facility] ([facility] [address] [place].,  [place], [place]  [phone] or [phone]) [phone]   [facility] ([facility]) [address], [place], [place] [phone] Accepts Medicaid  [facility] [address]  [place] [phone] Substance Abuse/Addiction Treatment   [facility] Organization         Address  Phone  Notes  [facility]  [phone]   Angie Fava, PhD [address] Kn[address] [place], [place]   [phone] or [phone]   [facility] Behavioral   [address] [place], [place] [phone]   [facility] [address] [place], [place] [phone] Insurance/Medicaid/sponsorship through [facility] and Families [address]                                    [place], [place] [phone] Therapy/tele-psych/case  [facility] [address], [place] [phone]    [name]  [phone]   [facility] [place] [facility]  [employer] [facility] Dept. 1) [address], [place] 2) [address] [place] 3)  [address], [place] [phone]  [phone]   [facility] Child Abuse Hotline 519-775-4610 or [phone] (After Hours)

## 2014-04-18 NOTE — ED Provider Notes (Signed)
CSN: [id number]     Arrival date & time 04/18/14  1025 History   First MD Initiated Contact with Patient 04/18/14 1021     Chief Complaint  Patient presents with  . multiple complaints      (Consider location/radiation/quality/duration/timing/severity/associated sxs/prior Treatment) The history is provided by the patient. No language interpreter was used.  Regina Steele is a 33 year old female with past medical history of PTSD, uterine prolapse, depression, chronic headache, constipation, osteopenia, anxiety, GERD presenting to the ED with generalized joint discomfort is been ongoing since November 2014. Reported that she's been having discomfort in her back, neck, bilateral knees, bilateral ankles-described as an aching sensation and tender upon palpation. Stated that this is been occurring every single day since November 2014-reported that she wakes up in pain and goes to sleep in pain. Patient reports that she's been seen and assessed by her primary care provider, Dr. Tanya Nones, who referred patient to rheumatologist-belief that patient may have lupus. Patient reported that she's tried numerous times in contact with rheumatologist, but is now been referred to anyone. Patient states that she is concerned-feels as if she is going to get her kids taken away from her secondary to not properly caring for them. Stated that she started to develop a headache today and lightheadedness-reported that headache is the center of the forehead described as an aching, tension sensation. Patient reports she frequently gets headaches, reports that these are similar presentation to her regular headaches. Reported that she's used aspirin x2 today. Stated that her doctor started her on prednisone taper dose with relief. Stated that when she was seen by her physician not too long ago she was given a shot of Toradol. Denied fever, chills, nausea, vomiting, diarrhea, chest pain, shortness of breath, difficulty breathing,  changes to appetite, changes to bowel movements, blurred vision, sudden loss of vision, weakness, numbness, tingling. Denied depression, HI, SI.  PCP Dr. Tanya Nones  Past Medical History  Diagnosis Date  . PTSD (post-traumatic stress disorder)   . Uterine prolapse 2013  . Depression   . Hiatal hernia   . Surgical menopause   . Chronic headache   . Constipation   . Esophageal reflux     no meds  . Osteopenia   . Eating disorder   . Anxiety   . Osteoporosis    Past Surgical History  Procedure Laterality Date  . Bilateral oophorectomy      bilat  . Rectocele repair    . Cesarean section    . Abdominal hysterectomy     Family History  Problem Relation Age of Onset  . Breast cancer Maternal Grandmother 54  . Breast cancer Paternal Aunt 40  . Celiac disease Paternal Grandmother   . Heart disease Father   . Colon cancer Neg Hx   . Mental illness Mother   . Bipolar disorder Maternal Grandmother   . Mental illness Brother    History  Substance Use Topics  . Smoking status: Never Smoker   . Smokeless tobacco: Never Used  . Alcohol Use: No   OB History   Grav Para Term Preterm Abortions TAB SAB Ect Mult Living   3 3 3       3      Review of Systems  Constitutional: Negative for fever and chills.  Respiratory: Negative for chest tightness and shortness of breath.   Cardiovascular: Negative for chest pain.  Gastrointestinal: Negative for nausea, vomiting and abdominal pain.  Genitourinary: Negative for dysuria and pelvic pain.  Musculoskeletal: Positive for arthralgias, back pain, joint swelling and neck pain. Negative for myalgias.  Neurological: Positive for light-headedness. Negative for dizziness, weakness and numbness.      Allergies  Gluten meal  Home Medications   Prior to Admission medications   Medication Sig Start Date End Date Taking? Authorizing Provider  buPROPion (WELLBUTRIN XL) 150 MG 24 hr tablet Take 1 tablet (150 mg total) by mouth every morning.  03/24/14  Yes Cleotis Nipper, MD  cetirizine (ZYRTEC) 10 MG tablet Take 10 mg by mouth daily.   Yes Historical Provider, MD  escitalopram (LEXAPRO) 20 MG tablet Take 1 tablet (20 mg total) by mouth daily. 03/24/14  Yes Cleotis Nipper, MD  estradiol (ESTRACE) 2 MG tablet Take 2 mg by mouth daily. For hormone replacement (Low estrogen) 07/13/13  Yes Sanjuana Kava, NP  Estradiol (VAGIFEM) 10 MCG TABS vaginal tablet Place 1 tablet (10 mcg total) vaginally 2 (two) times a week. On Mondays and Thursdays: For Vaginal atrophy 07/13/13  Yes Sanjuana Kava, NP  fluticasone (FLONASE) 50 MCG/ACT nasal spray Place 2 sprays into the nose daily as needed for allergies or rhinitis.   Yes Historical Provider, MD  ibandronate (BONIVA) 150 MG tablet Take 150 mg by mouth every 30 (thirty) days. Take in the morning with a full glass of water, on an empty stomach, and do not take anything else by mouth or lie down for the next 30 min.   Yes Historical Provider, MD  Linaclotide Karlene Einstein) 145 MCG CAPS capsule Take 1 capsule (145 mcg total) by mouth daily. 04/12/14  Yes Hilarie Fredrickson, MD  LORazepam (ATIVAN) 1 MG tablet Take 1 mg by mouth daily as needed for anxiety.   Yes Historical Provider, MD  mirtazapine (REMERON) 15 MG tablet Take 1 tablet (15 mg total) by mouth at bedtime. 03/24/14 03/24/15 Yes Cleotis Nipper, MD  naproxen sodium (ANAPROX) 220 MG tablet Take 1 tablet (220 mg total) by mouth 2 (two) times daily with a meal. 11/18/13  Yes Dorena Bodo, PA-C  risperiDONE (RISPERDAL) 2 MG tablet Take 1 tablet (2 mg total) by mouth 2 (two) times daily. 03/24/14  Yes Cleotis Nipper, MD  SUMAtriptan (IMITREX) 100 MG tablet Take 100 mg by mouth every 2 (two) hours as needed for migraine or headache. May repeat in 2 hours if headache persists or recurs.   Yes Historical Provider, MD  topiramate (TOPAMAX) 100 MG tablet Take 100-150 mg by mouth See admin instructions. 100mg  in am, 150mg  in pm   Yes Historical Provider, MD  predniSONE (DELTASONE) 20  MG tablet 3 tabs po day one, then 2 tabs daily x 4 days 04/18/14   Urvi Emmy Keng, PA-C   BP 106/62  Pulse 100  Temp(Src) 98 F (36.7 C) (Oral)  Resp 20  SpO2 98%  LMP 12/14/2003 Physical Exam  Nursing note and vitals reviewed. Constitutional: She is oriented to person, place, and time. She appears well-developed and well-nourished. No distress.  Patient found resting comfortably in bed when this provider walks into the room  HENT:  Head: Normocephalic and atraumatic.  Mouth/Throat: Oropharynx is clear and moist. No oropharyngeal exudate.  Eyes: Conjunctivae and EOM are normal. Pupils are equal, round, and reactive to light. Right eye exhibits no discharge. Left eye exhibits no discharge.  Negative nystagmus Visual fields grossly intact  Neck: Normal range of motion. Neck supple. No tracheal deviation present.  Negative neck stiffness Negative nuchal rigidity Negative cervical lymphadenopathy Negative meningeal  signs  Discomfort upon palpation to C-spine-appears to be muscular nature  Cardiovascular: Normal rate, regular rhythm, normal heart sounds and intact distal pulses.  Exam reveals no friction rub.   No murmur heard. Pulses:      Radial pulses are 2+ on the right side, and 2+ on the left side.       Dorsalis pedis pulses are 2+ on the right side, and 2+ on the left side.       Posterior tibial pulses are 2+ on the right side, and 2+ on the left side.  Cap refill less than 3 seconds Negative swelling or pitting edema identified to lower extremities bilaterally  Pulmonary/Chest: Effort normal and breath sounds normal. No respiratory distress. She has no wheezes. She has no rales.  Patient is able to speak in full sentences without difficulty Negative use of accessory muscles Negative stridor  Abdominal: Soft. Bowel sounds are normal. She exhibits no distension. There is no tenderness. There is no rebound and no guarding.  Negative abdominal distention Bowel sounds normal  active in all 4 quadrants Negative pain upon palpation Abdomen soft -negative guarding rigidity Negative peritoneal signs  Musculoskeletal:       Back:       Arms:      Legs: Negative deformities identified to the spine. Discomfort upon palpation to the midthoracic and lumbar spine. Negative swelling, erythema, inflammation, lesions, sores, deformities, warmth upon palpation noted to upper and lower extremities-joints. Mild discomfort upon palpation to bilateral knees circumferentially, bilateral ankles bilaterally and bilateral wrists. Full range of motion to upper and lower extremities bilaterally without difficulty or ataxia noted.  Lymphadenopathy:    She has no cervical adenopathy.  Neurological: She is alert and oriented to person, place, and time. No cranial nerve deficit. She exhibits normal muscle tone. Coordination normal.  Cranial nerves III-XII grossly intact Strength 5+/5+ to upper and lower extremities bilaterally with resistance applied, equal distribution noted Sensation intact Equal grip strength Negative arm drift Fine motor skills intact Negative facial drooping Negative slurred speech  Negative aphasia Gait proper, proper balance - negative sway, negative drift, negative step-offs  Skin: Skin is warm and dry. She is not diaphoretic. No erythema.  Please see musculoskeletal    ED Course  Procedures (including critical care time)  Results for orders placed during the hospital encounter of 04/18/14  CBC      Result Value Ref Range   WBC 6.3  4.0 - 10.5 K/uL   RBC 4.41  3.87 - 5.11 MIL/uL   Hemoglobin 13.4  12.0 - 15.0 g/dL   HCT 29.5  28.4 - 13.2 %   MCV 91.6  78.0 - 100.0 fL   MCH 30.4  26.0 - 34.0 pg   MCHC 33.2  30.0 - 36.0 g/dL   RDW 44.0  10.2 - 72.5 %   Platelets 204  150 - 400 K/uL  BASIC METABOLIC PANEL      Result Value Ref Range   Sodium 140  137 - 147 mEq/L   Potassium 4.3  3.7 - 5.3 mEq/L   Chloride 105  96 - 112 mEq/L   CO2 22  19 - 32  mEq/L   Glucose, Bld 102 (*) 70 - 99 mg/dL   BUN 16  6 - 23 mg/dL   Creatinine, Ser 3.66  0.50 - 1.10 mg/dL   Calcium 9.1  8.4 - 44.0 mg/dL   GFR calc non Af Amer 85 (*) >90 mL/min   GFR calc Af  Amer >90  >90 mL/min  URINALYSIS, ROUTINE W REFLEX MICROSCOPIC      Result Value Ref Range   Color, Urine YELLOW  YELLOW   APPearance CLEAR  CLEAR   Specific Gravity, Urine 1.001 (*) 1.005 - 1.030   pH 6.0  5.0 - 8.0   Glucose, UA NEGATIVE  NEGATIVE mg/dL   Hgb urine dipstick NEGATIVE  NEGATIVE   Bilirubin Urine NEGATIVE  NEGATIVE   Ketones, ur NEGATIVE  NEGATIVE mg/dL   Protein, ur NEGATIVE  NEGATIVE mg/dL   Urobilinogen, UA 0.2  0.0 - 1.0 mg/dL   Nitrite NEGATIVE  NEGATIVE   Leukocytes, UA NEGATIVE  NEGATIVE  POC URINE PREG, ED      Result Value Ref Range   Preg Test, Ur NEGATIVE  NEGATIVE    Labs Review Labs Reviewed  BASIC METABOLIC PANEL - Abnormal; Notable for the following:    Glucose, Bld 102 (*)    GFR calc non Af Amer 85 (*)    All other components within normal limits  URINALYSIS, ROUTINE W REFLEX MICROSCOPIC - Abnormal; Notable for the following:    Specific Gravity, Urine 1.001 (*)    All other components within normal limits  CBC  POC URINE PREG, ED    Imaging Review No results found.   EKG Interpretation None      MDM   Final diagnoses:  Arthralgia  Headache   Medications  sodium chloride 0.9 % bolus 1,000 mL (0 mLs Intravenous Stopped 04/18/14 1243)  acetaminophen (TYLENOL) tablet 650 mg (650 mg Oral Given 04/18/14 1205)   Filed Vitals:   04/18/14 0938 04/18/14 1144  BP: 111/74 106/62  Pulse: 78 100  Temp: 98.4 F (36.9 C) 98 F (36.7 C)  TempSrc: Oral   Resp: 20 20  SpO2: 99% 98%   EKG noted normal sinus rhythm with a heart rate of 62 beats per minute. CBC negative elevated white blood cell count noted. BMP negative findings-kidneys functioning well. Urine pregnancy negative. Urinalysis negative for nitrites, leukocytes. Negative hemoglobin  noted. Negative signs of infection. Patient hydrated ED setting, IV fluids. Patient tolerated PO. Reported headache is feeling better. Doubt septic joint. Doubt gout. Doubt meningitis. Doubt an infectious process. Doubt SAH. Doubt ICH. Suspicion to be possible beginnings of lupus-rheumatological issue. Patient has been followed up with primary care provider, is in the process of seeing a rheumatologist in getting rheumatology set up. Suspicion to be tension headache - patient denied new symptoms. Patient stable, afebrile. Patient is on appear septic. Patient does not appear a harm to herself or others. Discharged patient. Referred patient to primary care provider and rheumatologist. Discharged with prednisone. Discussed with patient to rest and stay hydrated. Discussed with patient to avoid and physical should is activity. Discussed with patient to closely monitor symptoms and if symptoms are to worsen or change to report back to the ED - strict return instructions given.  Patient agreed to plan of care, understood, all questions answered.   Harla Mensch, PA-C 04/18/14 1712

## 2014-04-18 NOTE — ED Notes (Signed)
Pt coming to ed with c/o multiple sx. She reports pain in her back, neck, knees and thinks that she has lupus but can't be seen by rheumatologist for another few weeks. Pt reports she feels" like I can't take care of my children today. I am afraid that social services will take my kids away because I'm so ill all the time and can't keep up with bathing them and feeding them". She also reports dizziness and "pain is messing up my mind, I don't feel competent anymore"

## 2014-04-19 NOTE — ED Provider Notes (Signed)
Medical screening examination/treatment/procedure(s) were performed by non-physician practitioner and as supervising physician I was immediately available for consultation/collaboration.   EKG Interpretation None        Courtney F Horton, MD 04/19/14 0646 

## 2014-04-20 ENCOUNTER — Telehealth (HOSPITAL_COMMUNITY): Payer: Self-pay

## 2014-04-20 DIAGNOSIS — F329 Major depressive disorder, single episode, unspecified: Secondary | ICD-10-CM | POA: Diagnosis not present

## 2014-04-20 DIAGNOSIS — R079 Chest pain, unspecified: Secondary | ICD-10-CM | POA: Diagnosis not present

## 2014-04-20 DIAGNOSIS — R5381 Other malaise: Secondary | ICD-10-CM | POA: Diagnosis not present

## 2014-04-20 DIAGNOSIS — F3289 Other specified depressive episodes: Secondary | ICD-10-CM | POA: Diagnosis not present

## 2014-04-21 ENCOUNTER — Encounter: Payer: Self-pay | Admitting: Family Medicine

## 2014-04-21 ENCOUNTER — Ambulatory Visit (INDEPENDENT_AMBULATORY_CARE_PROVIDER_SITE_OTHER): Payer: Medicare Other | Admitting: Family Medicine

## 2014-04-21 VITALS — BP 138/86 | HR 72 | Temp 97.8°F | Resp 16 | Ht 60.0 in | Wt 133.0 lb

## 2014-04-21 DIAGNOSIS — F319 Bipolar disorder, unspecified: Secondary | ICD-10-CM | POA: Diagnosis not present

## 2014-04-21 DIAGNOSIS — R894 Abnormal immunological findings in specimens from other organs, systems and tissues: Secondary | ICD-10-CM | POA: Diagnosis not present

## 2014-04-21 DIAGNOSIS — F4310 Post-traumatic stress disorder, unspecified: Secondary | ICD-10-CM | POA: Diagnosis not present

## 2014-04-21 DIAGNOSIS — R7689 Other specified abnormal immunological findings in serum: Secondary | ICD-10-CM

## 2014-04-21 DIAGNOSIS — F410 Panic disorder [episodic paroxysmal anxiety] without agoraphobia: Secondary | ICD-10-CM

## 2014-04-21 DIAGNOSIS — R768 Other specified abnormal immunological findings in serum: Secondary | ICD-10-CM

## 2014-04-21 NOTE — Patient Instructions (Addendum)
Continue current medications Change f/u to 4 weeks

## 2014-04-21 NOTE — Assessment & Plan Note (Signed)
She does followup with psychiatry as well as her therapist on a regular basis. I provided a letter which I gave her a copy and I will also fax to her social worker Mr. Wyatt Mage. Regarding her medical problems as well as my concerns regarding her care for the children. I think at a day care services can be reinstated this will help the family and the husband states that he will help some on the weekends. If she is unable to get any help with care and I think the children may end up and the foster care system. I have left a message with Mr. Claudie Fisherman I do not think that the children are in apparent danger today need to be removed today but they do need to have an open social services case which seems like is already been done

## 2014-04-21 NOTE — Progress Notes (Signed)
Patient ID: Regina Steele, female   DOB: [birth year], 33 y.o.   MRN: [id number]   Subjective:    Patient ID: Regina Steele, female    DOB: [birth year], 33 y.o.   MRN: [id number]  Patient presents for Discuss Personal Isues and Hospital F/u  patient here to discuss hospital followup and personal issues. She was seen in the hospital emergency room secondary to arthralgias and muscle aches. She thinks she was having a flare of her lupus when she tapered off the prednisone her chronic fatigue pains came back. She has an appointment with rheumatology tomorrow she is on prednisone taper currently and feels much better.  She requested a letter to social services to have her daycare services reinstated for her children. She required a letter stating that she has medical problems that are hindering her parenting and that she would benefit from the services. Of note she did have daycare services until up about 2-3 weeks ago when they were discontinued. She's separated from her husband since November 2014 her husband lives in an apartment with his mother and another adult the patient states that the apartment is very small and is unkempt and unclean and the children have picked up lites multiple times from the home this is been reported to do school before therefore they do not go to her husband's residence. Her husband has not been very supportive up until this weekend. He does not help take care of the kids during the week this week and he did care for the children on Saturday and Sunday while she rested. She's history of bipolar disorder with major depression as well as eating disorder chronic insomnia and posttraumatic stress disorder which have affected her severely over the past few years. She is often in the bed and has very little activity. Since the joint pains and aches that started a few months back she feels that she's not any of the care probably for her children. She describes that over the past week  the eldest child who is 9 has been bathing and feeding the younger children who are both 4 and 6. She states she does not have the energy to get up to be with them throughout the day but can do it in small spurts of time. When she had daycare services she had help from 8-5 and then in the evening time she felt more rested enough so that she could clean the house and provide dinner). For further children. She declines wanting to harm her children are herself. She does not want her children to be removed from the home but understands that she cannot help provide for them that they will go to foster care.    Review Of Systems:  GEN- +fatigue, fever, weight loss,weakness, recent illness HEENT- denies eye drainage, change in vision, nasal discharge, CVS- denies chest pain, palpitations RESP- denies SOB, cough, wheeze ABD- denies N/V, change in stools, abd pain GU- denies dysuria, hematuria, dribbling, incontinence MSK- + joint pain, +muscle aches, injury Neuro- denies headache, dizziness, syncope, seizure activity       Objective:    BP 138/86  Pulse 72  Temp(Src) 97.8 F (36.6 C) (Oral)  Resp 16  Ht 5' (1.524 m)  Wt 133 lb (60.328 kg)  BMI 25.97 kg/m2  LMP 12/14/2003 GEN- NAD, alert and oriented x3 Psych- flat affect, depressed, not overly anxious, fair eye contact, well groomed, normal speech, No SI, no HI, no apparent hallucinations  Assessment & Plan:      Problem List Items Addressed This Visit   None      Note: This dictation was prepared with Dragon dictation along with smaller phrase technology. Any transcriptional errors that result from this process are unintentional.

## 2014-04-21 NOTE — Assessment & Plan Note (Signed)
Followup with rheumatology tomorrow

## 2014-04-22 DIAGNOSIS — R894 Abnormal immunological findings in specimens from other organs, systems and tissues: Secondary | ICD-10-CM | POA: Diagnosis not present

## 2014-04-22 DIAGNOSIS — F3289 Other specified depressive episodes: Secondary | ICD-10-CM | POA: Diagnosis not present

## 2014-04-22 DIAGNOSIS — IMO0001 Reserved for inherently not codable concepts without codable children: Secondary | ICD-10-CM | POA: Diagnosis not present

## 2014-04-22 DIAGNOSIS — R6889 Other general symptoms and signs: Secondary | ICD-10-CM | POA: Diagnosis not present

## 2014-04-22 DIAGNOSIS — F329 Major depressive disorder, single episode, unspecified: Secondary | ICD-10-CM | POA: Diagnosis not present

## 2014-04-28 ENCOUNTER — Emergency Department (HOSPITAL_COMMUNITY)
Admission: EM | Admit: 2014-04-28 | Discharge: 2014-04-28 | Disposition: A | Discharge status: Home / Self Care | Payer: Medicare Other | Attending: Emergency Medicine | Admitting: Emergency Medicine

## 2014-04-28 ENCOUNTER — Encounter (HOSPITAL_COMMUNITY): Payer: Self-pay | Admitting: Emergency Medicine

## 2014-04-28 ENCOUNTER — Inpatient Hospital Stay: Payer: Medicare Other | Admitting: Physician Assistant

## 2014-04-28 DIAGNOSIS — F4310 Post-traumatic stress disorder, unspecified: Secondary | ICD-10-CM | POA: Insufficient documentation

## 2014-04-28 DIAGNOSIS — R51 Headache: Secondary | ICD-10-CM | POA: Diagnosis not present

## 2014-04-28 DIAGNOSIS — F411 Generalized anxiety disorder: Secondary | ICD-10-CM | POA: Insufficient documentation

## 2014-04-28 DIAGNOSIS — IMO0002 Reserved for concepts with insufficient information to code with codable children: Secondary | ICD-10-CM | POA: Insufficient documentation

## 2014-04-28 DIAGNOSIS — Z8742 Personal history of other diseases of the female genital tract: Secondary | ICD-10-CM | POA: Insufficient documentation

## 2014-04-28 DIAGNOSIS — G43909 Migraine, unspecified, not intractable, without status migrainosus: Secondary | ICD-10-CM | POA: Insufficient documentation

## 2014-04-28 DIAGNOSIS — Z8739 Personal history of other diseases of the musculoskeletal system and connective tissue: Secondary | ICD-10-CM | POA: Diagnosis not present

## 2014-04-28 DIAGNOSIS — F3289 Other specified depressive episodes: Secondary | ICD-10-CM | POA: Diagnosis not present

## 2014-04-28 DIAGNOSIS — Z8719 Personal history of other diseases of the digestive system: Secondary | ICD-10-CM | POA: Diagnosis not present

## 2014-04-28 DIAGNOSIS — G8929 Other chronic pain: Secondary | ICD-10-CM | POA: Diagnosis not present

## 2014-04-28 DIAGNOSIS — F329 Major depressive disorder, single episode, unspecified: Secondary | ICD-10-CM | POA: Diagnosis not present

## 2014-04-28 DIAGNOSIS — Z791 Long term (current) use of non-steroidal anti-inflammatories (NSAID): Secondary | ICD-10-CM | POA: Insufficient documentation

## 2014-04-28 DIAGNOSIS — R519 Headache, unspecified: Secondary | ICD-10-CM

## 2014-04-28 DIAGNOSIS — Z3202 Encounter for pregnancy test, result negative: Secondary | ICD-10-CM | POA: Diagnosis not present

## 2014-04-28 DIAGNOSIS — R42 Dizziness and giddiness: Secondary | ICD-10-CM | POA: Diagnosis not present

## 2014-04-28 DIAGNOSIS — Z79899 Other long term (current) drug therapy: Secondary | ICD-10-CM | POA: Diagnosis not present

## 2014-04-28 DIAGNOSIS — Z8669 Personal history of other diseases of the nervous system and sense organs: Secondary | ICD-10-CM

## 2014-04-28 LAB — POC URINE PREG, ED: PREG TEST UR: NEGATIVE

## 2014-04-28 MED ORDER — DIPHENHYDRAMINE HCL 50 MG/ML IJ SOLN
25.0000 mg | Freq: Once | INTRAMUSCULAR | Status: AC
  Administered 2014-04-28: 25 mg via INTRAVENOUS
  Filled 2014-04-28: qty 1

## 2014-04-28 MED ORDER — SODIUM CHLORIDE 0.9 % IV BOLUS (SEPSIS)
1000.0000 mL | Freq: Once | INTRAVENOUS | Status: AC
  Administered 2014-04-28: 1000 mL via INTRAVENOUS

## 2014-04-28 MED ORDER — KETOROLAC TROMETHAMINE 15 MG/ML IJ SOLN
15.0000 mg | Freq: Once | INTRAMUSCULAR | Status: AC
  Administered 2014-04-28: 15 mg via INTRAVENOUS
  Filled 2014-04-28: qty 1

## 2014-04-28 MED ORDER — METOCLOPRAMIDE HCL 5 MG/ML IJ SOLN
10.0000 mg | Freq: Once | INTRAMUSCULAR | Status: AC
  Administered 2014-04-28: 10 mg via INTRAVENOUS
  Filled 2014-04-28: qty 2

## 2014-04-28 NOTE — Discharge Instructions (Signed)
Call for a follow up appointment with a Family or Primary Care Provider.  Call [facility] for further evaluation of your headaches. Return if Symptoms worsen.   Take medication as prescribed. Stay hydrated while you are out in the sun or outside on hot days.

## 2014-04-28 NOTE — ED Provider Notes (Signed)
CSN: [id number]     Arrival date & time 04/28/14  1733 History   First MD Initiated Contact with Patient 04/28/14 1746     Chief Complaint  Patient presents with  . Migraine     (Consider location/radiation/quality/duration/timing/severity/associated sxs/prior Treatment) HPI Comments: Regina Steele is a 33 year old female with past medical history of PTSD, depression, chronic headache, constipation, osteopenia, anxiety, GERD presenting to the ED with a chief complaint of headache since this morning. The patient reports gradual onset of headache similar to previous migraines. She reports headache worsened with heat, currently rated 8/10. Denies other aggravating or relieving factors. She reports she was out in the sun all morning with children and the pool. She reports associated lightheadedness since 1300.  She denies syncope. She reports decrease in appetite and decrease in fluid intake, denies nausea, vomiting. She also denies rash, fever, chills.  She is followed by [facility] neurology for her migraines. She reports taking Imitrex prior to arrival with minimal relief.  She reports migraines exacerbated by heat and stress. Reports weekly headaches.  Patient is a 33 y.o. female presenting with migraines. The history is provided by the patient. No language interpreter was used.  Migraine This is a new problem. The current episode started today. The problem has been unchanged. Associated symptoms include headaches. Pertinent negatives include no abdominal pain, chills, fever, nausea, neck pain, numbness, rash, vomiting or weakness. Treatments tried: Imitrex.    Past Medical History  Diagnosis Date  . PTSD (post-traumatic stress disorder)   . Uterine prolapse 2013  . Depression   . Hiatal hernia   . Surgical menopause   . Chronic headache   . Constipation   . Esophageal reflux     no meds  . Osteopenia   . Eating disorder   . Anxiety   . Osteoporosis    Past Surgical History   Procedure Laterality Date  . Bilateral oophorectomy      bilat  . Rectocele repair    . Cesarean section    . Abdominal hysterectomy     Family History  Problem Relation Age of Onset  . Breast cancer Maternal Grandmother 27  . Breast cancer Paternal Aunt 40  . Celiac disease Paternal Grandmother   . Heart disease Father   . Colon cancer Neg Hx   . Mental illness Mother   . Bipolar disorder Maternal Grandmother   . Mental illness Brother    History  Substance Use Topics  . Smoking status: Never Smoker   . Smokeless tobacco: Never Used  . Alcohol Use: No   OB History   Grav Para Term Preterm Abortions TAB SAB Ect Mult Living   3 3 3       3      Review of Systems  Constitutional: Negative for fever and chills.  Gastrointestinal: Negative for nausea, vomiting, abdominal pain and diarrhea.  Musculoskeletal: Negative for neck pain.  Skin: Negative for rash.  Neurological: Positive for light-headedness and headaches. Negative for dizziness, syncope, weakness and numbness.      Allergies  Gluten meal  Home Medications   Prior to Admission medications   Medication Sig Start Date End Date Taking? Authorizing Provider  buPROPion (WELLBUTRIN XL) 150 MG 24 hr tablet Take 1 tablet (150 mg total) by mouth every morning. 03/24/14   03/26/14, MD  cetirizine (ZYRTEC) 10 MG tablet Take 10 mg by mouth daily.    Historical Provider, MD  escitalopram (LEXAPRO) 20 MG tablet Take 1 tablet (  20 mg total) by mouth daily. 03/24/14   Cleotis Nipper, MD  estradiol (ESTRACE) 2 MG tablet Take 2 mg by mouth daily. For hormone replacement (Low estrogen) 07/13/13   Sanjuana Kava, NP  Estradiol (VAGIFEM) 10 MCG TABS vaginal tablet Place 1 tablet (10 mcg total) vaginally 2 (two) times a week. On Mondays and Thursdays: For Vaginal atrophy 07/13/13   Sanjuana Kava, NP  fluticasone [facility]) 50 MCG/ACT nasal spray Place 2 sprays into the nose daily as needed for allergies or rhinitis.    Historical  Provider, MD  ibandronate (BONIVA) 150 MG tablet Take 150 mg by mouth every 30 (thirty) days. Take in the morning with a full glass of water, on an empty stomach, and do not take anything else by mouth or lie down for the next 30 min.    Historical Provider, MD  Linaclotide Karlene Einstein) 145 MCG CAPS capsule Take 1 capsule (145 mcg total) by mouth daily. 04/12/14   Hilarie Fredrickson, MD  LORazepam (ATIVAN) 1 MG tablet Take 1 mg by mouth daily as needed for anxiety.    Historical Provider, MD  mirtazapine (REMERON) 15 MG tablet Take 1 tablet (15 mg total) by mouth at bedtime. 03/24/14 03/24/15  Cleotis Nipper, MD  naproxen sodium (ANAPROX) 220 MG tablet Take 1 tablet (220 mg total) by mouth 2 (two) times daily with a meal. 11/18/13   Dorena Bodo, PA-C  predniSONE (DELTASONE) 20 MG tablet 3 tabs po day one, then 2 tabs daily x 4 days 04/18/14   Lorma Sciacca, PA-C  risperiDONE (RISPERDAL) 2 MG tablet Take 1 tablet (2 mg total) by mouth 2 (two) times daily. 03/24/14   Cleotis Nipper, MD  SUMAtriptan (IMITREX) 100 MG tablet Take 100 mg by mouth every 2 (two) hours as needed for migraine or headache. May repeat in 2 hours if headache persists or recurs.    Historical Provider, MD  topiramate (TOPAMAX) 100 MG tablet Take 100-150 mg by mouth See admin instructions. 100mg  in am, 150mg  in pm    Historical Provider, MD   BP 126/80  Pulse 80  Temp(Src) 97.9 F (36.6 C) (Oral)  Resp 16  SpO2 98%  LMP 12/14/2003 Physical Exam  Nursing note and vitals reviewed. Constitutional: She is oriented to person, place, and time. She appears well-developed and well-nourished.  Non-toxic appearance. She does not have a sickly appearance. She does not appear ill. No distress.  HENT:  Head: Normocephalic and atraumatic.  Eyes: Conjunctivae and EOM are normal. Pupils are equal, round, and reactive to light. Right eye exhibits no discharge. Left eye exhibits no discharge. No scleral icterus.  Neck: Normal range of motion. Neck supple.   Cardiovascular: Normal rate and regular rhythm.   Pulmonary/Chest: Effort normal and breath sounds normal. No respiratory distress. She has no wheezes. She has no rales.  Abdominal: Soft. Bowel sounds are normal. There is no tenderness. There is no rebound and no guarding.  Musculoskeletal: Normal range of motion. She exhibits no edema.  Neurological: She is alert and oriented to person, place, and time.  Speech is clear and goal oriented, follows commands Cranial nerves III - XII grossly intact, no facial droop Normal strength in upper and lower extremities bilaterally, strong and equal grip strength Sensation normal to light and sharp touch Moves all 4 extremities without ataxia, coordination intact  Skin: Skin is warm and dry. No rash noted. She is not diaphoretic.  Psychiatric: She has a normal mood and  affect. Her behavior is normal.    ED Course  Procedures (including critical care time) Labs Review Results for orders placed during the hospital encounter of 04/28/14  POC URINE PREG, ED      Result Value Ref Range   Preg Test, Ur NEGATIVE  NEGATIVE    MDM   Final diagnoses:  Headache  History of migraine  Patient presents with gradual onset of headache, similar to previous no red flags on exam or history, afebrile. Negative pregnancy. 2009 Re-eval Pt reports discomfort 3/10 and requesting to be discharged home.  Discussed treatment plan with the patient. Return precautions given. Reports understanding and no other concerns at this time.  Patient is stable for discharge at this time.  Meds given in ED:  Medications  sodium chloride 0.9 % bolus 1,000 mL (0 mLs Intravenous Stopped 04/28/14 2008)  ketorolac (TORADOL) 15 MG/ML injection 15 mg (15 mg Intravenous Given 04/28/14 1906)  diphenhydrAMINE (BENADRYL) injection 25 mg (25 mg Intravenous Given 04/28/14 1912)  metoCLOPramide (REGLAN) injection 10 mg (10 mg Intravenous Given 04/28/14 1905)    New Prescriptions   No  medications on file        Clabe Seal, PA-C 04/28/14 2028

## 2014-04-28 NOTE — ED Notes (Addendum)
Pt c/o migraine since this morning. Tried Imitrex w/o relief. Pt denies n/v. Pt also c/o dizziness.

## 2014-04-29 ENCOUNTER — Emergency Department (HOSPITAL_COMMUNITY)
Admission: EM | Admit: 2014-04-29 | Discharge: 2014-04-29 | Disposition: A | Discharge status: Home / Self Care | Payer: Medicare Other | Attending: Emergency Medicine | Admitting: Emergency Medicine

## 2014-04-29 ENCOUNTER — Encounter (HOSPITAL_COMMUNITY): Payer: Self-pay | Admitting: Emergency Medicine

## 2014-04-29 DIAGNOSIS — Z8719 Personal history of other diseases of the digestive system: Secondary | ICD-10-CM | POA: Insufficient documentation

## 2014-04-29 DIAGNOSIS — Z8739 Personal history of other diseases of the musculoskeletal system and connective tissue: Secondary | ICD-10-CM | POA: Diagnosis not present

## 2014-04-29 DIAGNOSIS — Z791 Long term (current) use of non-steroidal anti-inflammatories (NSAID): Secondary | ICD-10-CM | POA: Insufficient documentation

## 2014-04-29 DIAGNOSIS — F329 Major depressive disorder, single episode, unspecified: Secondary | ICD-10-CM | POA: Diagnosis not present

## 2014-04-29 DIAGNOSIS — F3289 Other specified depressive episodes: Secondary | ICD-10-CM | POA: Diagnosis not present

## 2014-04-29 DIAGNOSIS — H53149 Visual discomfort, unspecified: Secondary | ICD-10-CM | POA: Insufficient documentation

## 2014-04-29 DIAGNOSIS — Z8742 Personal history of other diseases of the female genital tract: Secondary | ICD-10-CM | POA: Diagnosis not present

## 2014-04-29 DIAGNOSIS — G8929 Other chronic pain: Secondary | ICD-10-CM | POA: Diagnosis not present

## 2014-04-29 DIAGNOSIS — Z79899 Other long term (current) drug therapy: Secondary | ICD-10-CM | POA: Diagnosis not present

## 2014-04-29 DIAGNOSIS — F411 Generalized anxiety disorder: Secondary | ICD-10-CM | POA: Insufficient documentation

## 2014-04-29 DIAGNOSIS — F4310 Post-traumatic stress disorder, unspecified: Secondary | ICD-10-CM | POA: Insufficient documentation

## 2014-04-29 DIAGNOSIS — R51 Headache: Secondary | ICD-10-CM | POA: Insufficient documentation

## 2014-04-29 DIAGNOSIS — R110 Nausea: Secondary | ICD-10-CM | POA: Diagnosis not present

## 2014-04-29 DIAGNOSIS — R519 Headache, unspecified: Secondary | ICD-10-CM

## 2014-04-29 MED ORDER — KETOROLAC TROMETHAMINE 15 MG/ML IJ SOLN
15.0000 mg | Freq: Once | INTRAMUSCULAR | Status: AC
  Administered 2014-04-29: 15 mg via INTRAVENOUS
  Filled 2014-04-29: qty 1

## 2014-04-29 MED ORDER — METOCLOPRAMIDE HCL 5 MG/ML IJ SOLN
10.0000 mg | Freq: Once | INTRAMUSCULAR | Status: AC
  Administered 2014-04-29: 10 mg via INTRAVENOUS
  Filled 2014-04-29: qty 2

## 2014-04-29 MED ORDER — DIPHENHYDRAMINE HCL 50 MG/ML IJ SOLN
25.0000 mg | Freq: Once | INTRAMUSCULAR | Status: AC
  Administered 2014-04-29: 25 mg via INTRAVENOUS
  Filled 2014-04-29: qty 1

## 2014-04-29 MED ORDER — SODIUM CHLORIDE 0.9 % IV BOLUS (SEPSIS)
1000.0000 mL | Freq: Once | INTRAVENOUS | Status: AC
  Administered 2014-04-29: 1000 mL via INTRAVENOUS

## 2014-04-29 NOTE — ED Provider Notes (Signed)
Medical screening examination/treatment/procedure(s) were performed by non-physician practitioner and as supervising physician I was immediately available for consultation/collaboration.   EKG Interpretation None       Juliet Rude. Rubin Payor, MD 04/29/14 0003

## 2014-04-29 NOTE — Discharge Instructions (Signed)

## 2014-04-29 NOTE — ED Notes (Signed)
Pt was just seen yesterday for same symptoms of migraine and weakness, Pt present today to ED for exact.

## 2014-05-03 ENCOUNTER — Encounter: Payer: Self-pay | Admitting: Physician Assistant

## 2014-05-03 ENCOUNTER — Ambulatory Visit (INDEPENDENT_AMBULATORY_CARE_PROVIDER_SITE_OTHER): Payer: Medicare Other | Admitting: Physician Assistant

## 2014-05-03 ENCOUNTER — Encounter: Payer: Self-pay | Admitting: Family Medicine

## 2014-05-03 VITALS — BP 104/66 | HR 84 | Temp 97.6°F | Resp 18 | Wt 134.0 lb

## 2014-05-03 DIAGNOSIS — F411 Generalized anxiety disorder: Secondary | ICD-10-CM

## 2014-05-03 DIAGNOSIS — G43801 Other migraine, not intractable, with status migrainosus: Secondary | ICD-10-CM | POA: Diagnosis not present

## 2014-05-03 DIAGNOSIS — F4310 Post-traumatic stress disorder, unspecified: Secondary | ICD-10-CM

## 2014-05-03 DIAGNOSIS — F341 Dysthymic disorder: Secondary | ICD-10-CM | POA: Diagnosis not present

## 2014-05-03 DIAGNOSIS — F319 Bipolar disorder, unspecified: Secondary | ICD-10-CM

## 2014-05-03 DIAGNOSIS — K5900 Constipation, unspecified: Secondary | ICD-10-CM

## 2014-05-03 DIAGNOSIS — G43909 Migraine, unspecified, not intractable, without status migrainosus: Secondary | ICD-10-CM

## 2014-05-03 DIAGNOSIS — M810 Age-related osteoporosis without current pathological fracture: Secondary | ICD-10-CM

## 2014-05-03 NOTE — ED Provider Notes (Signed)
CSN: [id number]     Arrival date & time 04/29/14  1028 History   First MD Initiated Contact with Patient 04/29/14 1045     Chief Complaint  Patient presents with  . Migraine     (Consider location/radiation/quality/duration/timing/severity/associated sxs/prior Treatment) HPI  32yF with headache. Has history of what she calls migraines and says that current symptoms feel similar. Denies trauma. No fever or chills. Associated with nausea and photophobia. No fever. No neck stiffness. Has tried sumatriptan and nsaids w/o much change. Aside from photophobia, denies vision changes. No numbness, tingling or focal loss of strength.   Past Medical History  Diagnosis Date  . PTSD (post-traumatic stress disorder)   . Uterine prolapse 2013  . Depression   . Hiatal hernia   . Surgical menopause   . Chronic headache   . Constipation   . Esophageal reflux     no meds  . Osteopenia   . Eating disorder   . Anxiety   . Osteoporosis    Past Surgical History  Procedure Laterality Date  . Bilateral oophorectomy      bilat  . Rectocele repair    . Cesarean section    . Abdominal hysterectomy     Family History  Problem Relation Age of Onset  . Breast cancer Maternal Grandmother 57  . Breast cancer Paternal Aunt 40  . Celiac disease Paternal Grandmother   . Heart disease Father   . Colon cancer Neg Hx   . Mental illness Mother   . Bipolar disorder Maternal Grandmother   . Mental illness Brother    History  Substance Use Topics  . Smoking status: Never Smoker   . Smokeless tobacco: Never Used  . Alcohol Use: No   OB History   Grav Para Term Preterm Abortions TAB SAB Ect Mult Living   3 3 3       3      Review of Systems  All systems reviewed and negative, other than as noted in HPI.   Allergies  Gluten meal  Home Medications   Prior to Admission medications   Medication Sig Start Date End Date Taking? Authorizing Provider  buPROPion (WELLBUTRIN XL) 150 MG 24 hr tablet  Take 1 tablet (150 mg total) by mouth every morning. 03/24/14  Yes 03/26/14, MD  cetirizine (ZYRTEC) 10 MG tablet Take 10 mg by mouth daily.   Yes Historical Provider, MD  escitalopram (LEXAPRO) 20 MG tablet Take 1 tablet (20 mg total) by mouth daily. 03/24/14  Yes 03/26/14, MD  estradiol (ESTRACE) 2 MG tablet Take 2 mg by mouth daily. For hormone replacement (Low estrogen) 07/13/13  Yes 09/12/13, NP  Estradiol (VAGIFEM) 10 MCG TABS vaginal tablet Place 1 tablet (10 mcg total) vaginally 2 (two) times a week. On Mondays and Thursdays: For Vaginal atrophy 07/13/13  Yes 09/12/13, NP  fluticasone (FLONASE) 50 MCG/ACT nasal spray Place 2 sprays into the nose daily as needed for allergies or rhinitis.   Yes Historical Provider, MD  ibandronate (BONIVA) 150 MG tablet Take 150 mg by mouth every 30 (thirty) days. Take in the morning with a full glass of water, on an empty stomach, and do not take anything else by mouth or lie down for the next 30 min.   Yes Historical Provider, MD  Linaclotide Sanjuana Kava) 145 MCG CAPS capsule Take 1 capsule (145 mcg total) by mouth daily. 04/12/14  Yes 06/12/14, MD  LORazepam (ATIVAN) 1 MG  tablet Take 1 mg by mouth daily as needed for anxiety.   Yes Historical Provider, MD  mirtazapine (REMERON) 15 MG tablet Take 1 tablet (15 mg total) by mouth at bedtime. 03/24/14 03/24/15 Yes Cleotis Nipper, MD  naproxen sodium (ANAPROX) 220 MG tablet Take 1 tablet (220 mg total) by mouth 2 (two) times daily with a meal. 11/18/13  Yes Dorena Bodo, PA-C  risperiDONE (RISPERDAL) 2 MG tablet Take 1 tablet (2 mg total) by mouth 2 (two) times daily. 03/24/14  Yes Cleotis Nipper, MD  SUMAtriptan (IMITREX) 100 MG tablet Take 100 mg by mouth every 2 (two) hours as needed for migraine or headache. May repeat in 2 hours if headache persists or recurs.   Yes Historical Provider, MD  topiramate (TOPAMAX) 100 MG tablet Take 100-150 mg by mouth See admin instructions. 100mg  in am, 150mg  in pm   Yes  Historical Provider, MD   BP 115/75  Pulse 74  Temp(Src) 97.6 F (36.4 C) (Oral)  Resp 20  SpO2 100%  LMP 12/14/2003 Physical Exam  Nursing note and vitals reviewed. Constitutional: She is oriented to person, place, and time. She appears well-developed and well-nourished. No distress.  HENT:  Head: Normocephalic and atraumatic.  Eyes: Conjunctivae and EOM are normal. Pupils are equal, round, and reactive to light. Right eye exhibits no discharge. Left eye exhibits no discharge.  Neck: Neck supple.  No nuchal rigidity  Cardiovascular: Normal rate, regular rhythm and normal heart sounds.  Exam reveals no gallop and no friction rub.   No murmur heard. Pulmonary/Chest: Effort normal and breath sounds normal. No respiratory distress.  Abdominal: Soft. She exhibits no distension. There is no tenderness.  Musculoskeletal: She exhibits no edema and no tenderness.  Neurological: She is alert and oriented to person, place, and time. No cranial nerve deficit. She exhibits normal muscle tone. Coordination normal.  Speech clear. Content appropriate.   Skin: Skin is warm and dry.  Psychiatric: She has a normal mood and affect. Her behavior is normal. Thought content normal.    ED Course  Procedures (including critical care time) Labs Review Labs Reviewed - No data to display  Imaging Review No results found.   EKG Interpretation None      MDM   Final diagnoses:  Headache    32yF with HA. Just recently seen for same. Suspect primary HA. Consider emergent secondary causes such as bleed, infectious or mass but doubt. There is no history of trauma. Pt has a nonfocal neurological exam. Afebrile and neck supple. No use of blood thinning medication. Consider ocular etiology such as acute angle closure glaucoma but doubt. Pt denies acute change in visual acuity and eye exam unremarkable. Doubt CO poisoning. No contacts with similar symptoms. Doubt venous thrombosis. Doubt carotid or  vertebral arteries dissection. Symptoms improved with meds. Feel that can be safely discharged, but strict return precautions discussed. Outpt fu.     , MD 05/03/14 [id number]

## 2014-05-03 NOTE — Progress Notes (Signed)
Patient ID: KYNNEDI ZWEIG MRN: [id number], DOB: 07-21-1981, 33 y.o. Date of Encounter: 05/03/2014, 10:23 AM    Chief Complaint:  Chief Complaint  Patient presents with  . ED x 2 last week    HA's and fatique     HPI: 33 y.o. year old female says that she had a migraine headache last week and  called here but we could not get her in so she ended up going to the emergency room because she had migraines that were not relieved with Imitrex. Says that she feels silly for even coming in today but she had already scheduled the appointment prior to the ER visits. Says that now the migraines have resolved and she really does not need any treatment today. I asked what medical provider she sees on a routine basis. Says that she sees a psychiatrist OB/GYN GI-Dr.Perry --for Linzess and these issues.   Says that psychiatry prescribes her Topamax and that they actually have recently decreased the dose.     Home Meds:   Outpatient Prescriptions Prior to Visit  Medication Sig Dispense Refill  . buPROPion (WELLBUTRIN XL) 150 MG 24 hr tablet Take 1 tablet (150 mg total) by mouth every morning.  30 tablet  2  . cetirizine (ZYRTEC) 10 MG tablet Take 10 mg by mouth daily.      [name] escitalopram (LEXAPRO) 20 MG tablet Take 1 tablet (20 mg total) by mouth daily.  30 tablet  2  . estradiol (ESTRACE) 2 MG tablet Take 2 mg by mouth daily. For hormone replacement (Low estrogen)      . Estradiol (VAGIFEM) 10 MCG TABS vaginal tablet Place 1 tablet (10 mcg total) vaginally 2 (two) times a week. On Mondays and Thursdays: For Vaginal atrophy  8 tablet    . fluticasone (FLONASE) 50 MCG/ACT nasal spray Place 2 sprays into the nose daily as needed for allergies or rhinitis.      [name] ibandronate (BONIVA) 150 MG tablet Take 150 mg by mouth every 30 (thirty) days. Take in the morning with a full glass of water, on an empty stomach, and do not take anything else by mouth or lie down for the next 30 min.      .  Linaclotide (LINZESS) 145 MCG CAPS capsule Take 1 capsule (145 mcg total) by mouth daily.  30 capsule  1  . LORazepam (ATIVAN) 1 MG tablet Take 1 mg by mouth daily as needed for anxiety.      . mirtazapine (REMERON) 15 MG tablet Take 1 tablet (15 mg total) by mouth at bedtime.  30 tablet  2  . naproxen sodium (ANAPROX) 220 MG tablet Take 1 tablet (220 mg total) by mouth 2 (two) times daily with a meal.  60 tablet  1  . risperiDONE (RISPERDAL) 2 MG tablet Take 1 tablet (2 mg total) by mouth 2 (two) times daily.  60 tablet  2  . SUMAtriptan (IMITREX) 100 MG tablet Take 100 mg by mouth every 2 (two) hours as needed for migraine or headache. May repeat in 2 hours if headache persists or recurs.      . topiramate (TOPAMAX) 100 MG tablet Take 100-150 mg by mouth See admin instructions. 100mg  in am, 150mg  in pm       No facility-administered medications prior to visit.    Allergies:  Allergies  Allergen Reactions  . Gluten Meal     Bloating       Review of Systems: See HPI for  pertinent ROS. All other ROS negative.    Physical Exam: Blood pressure 104/66, pulse 84, temperature 97.6 F (36.4 C), temperature source Oral, resp. rate 18, weight 134 lb (60.782 kg), last menstrual period 12/14/2003., Body mass index is 26.17 kg/(m^2). General: WF.  Appears in no acute distress. Neck: Supple. No thyromegaly. No lymphadenopathy. Lungs: Clear bilaterally to auscultation without wheezes, rales, or rhonchi. Breathing is unlabored. Heart: Regular rhythm. No murmurs, rubs, or gallops. Msk:  Strength and tone normal for age. Extremities/Skin: Warm and dry.  Neuro: Alert and oriented X 3. Moves all extremities spontaneously. Gait is normal. CNII-XII grossly in tact. Psych: Responds to questions appropriately. Poor eye contact. Has her children with her today.      ASSESSMENT AND PLAN:  33 y.o. year old female with  1. Other migraine with status migrainosus, not intractable Discussed with her that  Topamax can be used as a preventive medicine for migraines. Told her to make sure to let the psychiatrist know she has been having increased migraines recently so they can consider adjusting his dose if they feel this is appropriate. Given her other psych medications and psychiatric issues, I do not feel comfortable to  adjust this today myself but rather encouraged her to followup with her psychiatrist regarding this.  2. ANXIETY DEPRESSION  3. CONSTIPATION  4. PTSD (post-traumatic stress disorder)  5. Bipolar disorder, unspecified  6. Anxiety state, unspecified  7. Osteoporosis  8. GAD (generalized anxiety disorder)   Signed, Shon Hale [place], [place], [facility] 05/03/2014 10:23 AM

## 2014-05-04 ENCOUNTER — Telehealth (HOSPITAL_COMMUNITY): Payer: Self-pay | Admitting: *Deleted

## 2014-05-04 ENCOUNTER — Telehealth (HOSPITAL_COMMUNITY): Payer: Self-pay | Admitting: Psychiatry

## 2014-05-04 NOTE — Telephone Encounter (Signed)
I returned patient's phone call.  She is complaining of increased stress and depression.  She has difficulty doing her ADLs because of headache and worsening of depression.  She mentioned that she is taking care of the kids as tremors are not going to daycare.  She feels overwhelmed.  She admitted negative thoughts but denies any active suicidal parts or plan.  She has visited twice to the emergency room for headaches.  She is taking her medication.  I spoke to her primary care physician who is trying to help from social services so she can send her children to daycare.  Patient has limited family support.  I also discussed with the patient and her primary care physician to see a neurologist as her headaches are getting worse.  She is taking Topamax 250 mg and she need a neurology consult.  Her current neurologist is in [place] which is far.  I suggested to get a referral from [facility] Neurology.  In the meantime I will increase her Wellbutrin 300 mg daily.  Her primary care physician will help to get a referral to see a neurologist.  I suggested that if her symptoms of depression did not get better that she should contact us.

## 2014-05-04 NOTE — Telephone Encounter (Signed)
Patient left VM: Depression symptoms increased over last 2 weeks - crying, shaking, difficulty doing basic tasks like dialing phone or setting up medication. Emotionally disconnected.Having a hard time caring for children. Trouble adjusting to diagnosis of lupus. Has been thinking a lot of negative thoughts, such as death vs. life.

## 2014-05-05 ENCOUNTER — Ambulatory Visit: Payer: Self-pay | Admitting: *Deleted

## 2014-05-05 ENCOUNTER — Other Ambulatory Visit: Payer: Self-pay | Admitting: Family Medicine

## 2014-05-05 DIAGNOSIS — G43809 Other migraine, not intractable, without status migrainosus: Secondary | ICD-10-CM

## 2014-05-06 DIAGNOSIS — F909 Attention-deficit hyperactivity disorder, unspecified type: Secondary | ICD-10-CM | POA: Diagnosis not present

## 2014-05-06 DIAGNOSIS — F0630 Mood disorder due to known physiological condition, unspecified: Secondary | ICD-10-CM | POA: Diagnosis not present

## 2014-05-12 ENCOUNTER — Ambulatory Visit (HOSPITAL_COMMUNITY): Payer: Self-pay | Admitting: Professional Counselor

## 2014-05-13 ENCOUNTER — Ambulatory Visit (INDEPENDENT_AMBULATORY_CARE_PROVIDER_SITE_OTHER): Payer: Medicare Other | Admitting: Neurology

## 2014-05-13 ENCOUNTER — Encounter: Payer: Self-pay | Admitting: Neurology

## 2014-05-13 VITALS — BP 116/78 | HR 72 | Temp 98.4°F | Ht 60.0 in | Wt 130.0 lb

## 2014-05-13 DIAGNOSIS — G4489 Other headache syndrome: Secondary | ICD-10-CM | POA: Diagnosis not present

## 2014-05-13 MED ORDER — SUMATRIPTAN SUCCINATE 100 MG PO TABS: ORAL_TABLET | ORAL | Status: DC

## 2014-05-13 MED ORDER — GABAPENTIN 100 MG PO CAPS: ORAL_CAPSULE | ORAL | Status: DC

## 2014-05-13 NOTE — Patient Instructions (Addendum)
Please remember, common headache triggers are: sleep deprivation, dehydration, overheating, stress, hypoglycemia or skipping meals and blood sugar fluctuations, excessive pain medications or excessive alcohol use or caffeine withdrawal. Some people have food triggers such as aged cheese, orange juice or chocolate, especially dark chocolate, or MSG (monosodium glutamate). Try to avoid these headache triggers as much possible. It may be helpful to keep a headache diary to figure out what makes your headaches worse or brings them on and what alleviates them. Some people report headache onset after exercise but studies have shown that regular exercise may actually prevent headaches from coming. If you have exercise-induced headaches, please make sure that you drink plenty of fluid before and after exercising and that you do not over do it and do not overheat.  Let's try a small dose of neurontin for migraine prevention: Start Neurontin (gabapentin) 100 mg strength: Take 1 pill nightly at bedtime for 1 week, then 2 pills nightly for 1 week, then 3 pills each night thereafter. The most common side effects reported are sedation or sleepiness. Rare side effects include balance problems, confusion.   Please be aware that Imitrex or any other triptan and Lexapro or any other SSRI or SNRI together can sometimes cause a rare and potentially dangerous adverse reaction, called SEROTONIN SYNDROME: Symptoms of this condition include (but are not limited to):  Agitation or restlessness, confusion, rapid heart rate and high blood pressure, dilated pupils, loss of muscle coordination or twitching muscles, muscle rigidity/stiffness, sweating and/or flushing, diarrhea, headache, shivering, goose bumps. If you have any of these symptoms you may have to stop the medication. Call your health care provider immediately.  Severe serotonin syndrome can be life-threatening emergency. Signs and symptoms of a severe reaction may include:  high fever, seizures, irregular heartbeat, unconsciousness or altered level of awareness or personality changes.  If you have any of these new symptoms, call 911 or have someone take you to the emergency room.
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
## 2014-05-17 ENCOUNTER — Telehealth (HOSPITAL_COMMUNITY): Payer: Self-pay | Admitting: *Deleted

## 2014-05-17 NOTE — Telephone Encounter (Signed)
Patient left WI:OMBTD very well. Wellbutrin was increased to 300 mg and it has helped. Now needs prescription sent to pharmacy for Wellbutrin 300 mg as old RX was for 150 mg.

## 2014-05-18 ENCOUNTER — Ambulatory Visit: Payer: Medicare Other | Admitting: Family Medicine

## 2014-05-21 ENCOUNTER — Encounter: Payer: Self-pay | Admitting: Family Medicine

## 2014-05-21 ENCOUNTER — Ambulatory Visit (INDEPENDENT_AMBULATORY_CARE_PROVIDER_SITE_OTHER): Payer: Medicare Other | Admitting: Family Medicine

## 2014-05-21 VITALS — BP 130/78 | HR 64 | Temp 97.8°F | Resp 12 | Ht 61.0 in | Wt 135.0 lb

## 2014-05-21 DIAGNOSIS — G43909 Migraine, unspecified, not intractable, without status migrainosus: Secondary | ICD-10-CM

## 2014-05-21 DIAGNOSIS — N6459 Other signs and symptoms in breast: Secondary | ICD-10-CM | POA: Diagnosis not present

## 2014-05-21 DIAGNOSIS — M797 Fibromyalgia: Secondary | ICD-10-CM

## 2014-05-21 DIAGNOSIS — G43009 Migraine without aura, not intractable, without status migrainosus: Secondary | ICD-10-CM | POA: Diagnosis not present

## 2014-05-21 DIAGNOSIS — N643 Galactorrhea not associated with childbirth: Secondary | ICD-10-CM

## 2014-05-21 DIAGNOSIS — IMO0001 Reserved for inherently not codable concepts without codable children: Secondary | ICD-10-CM | POA: Diagnosis not present

## 2014-05-21 DIAGNOSIS — F341 Dysthymic disorder: Secondary | ICD-10-CM

## 2014-05-21 DIAGNOSIS — N6452 Nipple discharge: Secondary | ICD-10-CM | POA: Insufficient documentation

## 2014-05-21 NOTE — Assessment & Plan Note (Signed)
I reviewed rheumatology note even though she has positive ANA she does not meet the criteria for lupus this is more fibromyalgia syndrome chronic fatigue

## 2014-05-21 NOTE — Progress Notes (Signed)
Patient ID: Regina Steele, female   DOB: 1981/10/26, 33 y.o.   MRN: [id number]   Subjective:    Patient ID: Regina Steele, female    DOB: [birth year], 33 y.o.   MRN: [id number]  Patient presents for 4 week F/U and Weakness/ Dizziness  A. she had a followup specialist. She was seen a few weeks ago secondary to worsening depression having difficulty taking care of her children she also is having worsening migraines. She was seen by her psychiatrist the Wellbutrin was increased to 300 mg she's doing much better with this dose and does not feel as depressed. Her psychiatrist to speak with me and states his concern about her migraines were increasing and that she was seen by neurology in the past and he wanted to have them see her as he was prescribing Topamax. She was seen by neurology they have started gabapentin she's been on this for a week he states that her muscle pains have improved and her migraines are better she still has some dizzy spells.  Shooting complaints of a yellow discharge from both breasts the left one is tender to touch. This is been going on for the past few weeks. Of note she is on Risperdal.   Review Of Systems:  GEN- + fatigue, denies fever, weight loss,weakness, recent illness HEENT- denies eye drainage, change in vision, nasal discharge, CVS- denies chest pain, palpitations RESP- denies SOB, cough, wheeze ABD- denies N/V, change in stools, abd pain GU- denies dysuria, hematuria, dribbling, incontinence MSK- +joint pain,+ muscle aches, injury Neuro- +headache,+ dizziness, syncope, seizure activity       Objective:    BP 130/78  Pulse 64  Temp(Src) 97.8 F (36.6 C) (Oral)  Resp 12  Ht 5\' 1"  (1.549 m)  Wt 135 lb (61.236 kg)  BMI 25.52 kg/m2  LMP 12/14/2003 GEN- NAD, alert and oriented x3 HEENT- PERRL, EOMI, non injected sclera, pink conjunctiva, MMM, oropharynx clear Neck- Supple, no thyromegaly CVS- RRR, no murmur RESP-CTAB Breast- normal symmetry, no  nipple inversion+ milkly  nipple drainage, no nodules or lumps felt, no erythema or induration  Nodes- no axillary nodes EXT- No edema Psych- flat affect, not anxious appearing, well groomed, normal speech, no SI Pulses- Radial 2+        Assessment & Plan:      Problem List Items Addressed This Visit   Breast discharge - Primary   Relevant Orders      Prolactin      Wound culture      Basic metabolic panel    Other Visit Diagnoses   Galactorrhea           Note: This dictation was prepared with Dragon dictation along with smaller phrase technology. Any transcriptional errors that result from this process are unintentional.

## 2014-05-21 NOTE — Assessment & Plan Note (Signed)
Had taken a sample and she states the discharge is more of a yellow discharge with some tenderness I see no signs of infection today I think this is more galactorrhea. I will check a prolactin level she had a normal TSH a couple months ago I think this is due to her Risperdal, We may also need ultrasound of the breast

## 2014-05-21 NOTE — Assessment & Plan Note (Signed)
Improved  With wellbutrin, continue with psychiatry

## 2014-05-21 NOTE — Patient Instructions (Addendum)
Continue current medications We will call with lab results  F/U as needed in 3 months

## 2014-05-21 NOTE — Assessment & Plan Note (Signed)
Improves with gabapentin we'll continue

## 2014-05-22 ENCOUNTER — Emergency Department (HOSPITAL_COMMUNITY)
Admission: EM | Admit: 2014-05-22 | Discharge: 2014-05-22 | Disposition: A | Discharge status: Home / Self Care | Payer: Medicare Other | Attending: Emergency Medicine | Admitting: Emergency Medicine

## 2014-05-22 ENCOUNTER — Encounter (HOSPITAL_COMMUNITY): Payer: Self-pay | Admitting: Emergency Medicine

## 2014-05-22 DIAGNOSIS — F411 Generalized anxiety disorder: Secondary | ICD-10-CM | POA: Insufficient documentation

## 2014-05-22 DIAGNOSIS — M949 Disorder of cartilage, unspecified: Secondary | ICD-10-CM | POA: Diagnosis not present

## 2014-05-22 DIAGNOSIS — F329 Major depressive disorder, single episode, unspecified: Secondary | ICD-10-CM | POA: Diagnosis not present

## 2014-05-22 DIAGNOSIS — Z79899 Other long term (current) drug therapy: Secondary | ICD-10-CM | POA: Insufficient documentation

## 2014-05-22 DIAGNOSIS — IMO0002 Reserved for concepts with insufficient information to code with codable children: Secondary | ICD-10-CM | POA: Diagnosis not present

## 2014-05-22 DIAGNOSIS — Z791 Long term (current) use of non-steroidal anti-inflammatories (NSAID): Secondary | ICD-10-CM | POA: Diagnosis not present

## 2014-05-22 DIAGNOSIS — K529 Noninfective gastroenteritis and colitis, unspecified: Secondary | ICD-10-CM

## 2014-05-22 DIAGNOSIS — Z87448 Personal history of other diseases of urinary system: Secondary | ICD-10-CM | POA: Diagnosis not present

## 2014-05-22 DIAGNOSIS — K5900 Constipation, unspecified: Secondary | ICD-10-CM | POA: Insufficient documentation

## 2014-05-22 DIAGNOSIS — K5289 Other specified noninfective gastroenteritis and colitis: Secondary | ICD-10-CM | POA: Diagnosis not present

## 2014-05-22 DIAGNOSIS — F3289 Other specified depressive episodes: Secondary | ICD-10-CM | POA: Insufficient documentation

## 2014-05-22 DIAGNOSIS — M899 Disorder of bone, unspecified: Secondary | ICD-10-CM | POA: Insufficient documentation

## 2014-05-22 DIAGNOSIS — G8929 Other chronic pain: Secondary | ICD-10-CM | POA: Diagnosis not present

## 2014-05-22 DIAGNOSIS — R112 Nausea with vomiting, unspecified: Secondary | ICD-10-CM | POA: Diagnosis present

## 2014-05-22 DIAGNOSIS — F4310 Post-traumatic stress disorder, unspecified: Secondary | ICD-10-CM | POA: Insufficient documentation

## 2014-05-22 LAB — CBC WITH DIFFERENTIAL/PLATELET
BASOS ABS: 0 10*3/uL (ref 0.0–0.1)
BASOS PCT: 1 % (ref 0–1)
Eosinophils Absolute: 0.2 10*3/uL (ref 0.0–0.7)
Eosinophils Relative: 4 % (ref 0–5)
HEMATOCRIT: 38.3 % (ref 36.0–46.0)
HEMOGLOBIN: 12.7 g/dL (ref 12.0–15.0)
Lymphocytes Relative: 33 % (ref 12–46)
Lymphs Abs: 1.3 10*3/uL (ref 0.7–4.0)
MCH: 29.7 pg (ref 26.0–34.0)
MCHC: 33.2 g/dL (ref 30.0–36.0)
MCV: 89.7 fL (ref 78.0–100.0)
Monocytes Absolute: 0.3 10*3/uL (ref 0.1–1.0)
Monocytes Relative: 7 % (ref 3–12)
NEUTROS ABS: 2.1 10*3/uL (ref 1.7–7.7)
NEUTROS PCT: 55 % (ref 43–77)
Platelets: 252 10*3/uL (ref 150–400)
RBC: 4.27 MIL/uL (ref 3.87–5.11)
RDW: 12.4 % (ref 11.5–15.5)
WBC: 3.9 10*3/uL — AB (ref 4.0–10.5)

## 2014-05-22 LAB — URINALYSIS, ROUTINE W REFLEX MICROSCOPIC
Bilirubin Urine: NEGATIVE
Glucose, UA: NEGATIVE mg/dL
HGB URINE DIPSTICK: NEGATIVE
Ketones, ur: NEGATIVE mg/dL
LEUKOCYTES UA: NEGATIVE
NITRITE: NEGATIVE
PROTEIN: NEGATIVE mg/dL
SPECIFIC GRAVITY, URINE: 1.001 — AB (ref 1.005–1.030)
UROBILINOGEN UA: 0.2 mg/dL (ref 0.0–1.0)
pH: 7 (ref 5.0–8.0)

## 2014-05-22 LAB — BASIC METABOLIC PANEL
ANION GAP: 12 (ref 5–15)
BUN: 13 mg/dL (ref 6–23)
BUN: 11 mg/dL (ref 6–23)
CALCIUM: 9.1 mg/dL (ref 8.4–10.5)
CALCIUM: 8.9 mg/dL (ref 8.4–10.5)
CO2: 24 mEq/L (ref 19–32)
CO2: 22 mEq/L (ref 19–32)
CREATININE: 0.94 mg/dL (ref 0.50–1.10)
Chloride: 104 mEq/L (ref 96–112)
Chloride: 101 mEq/L (ref 96–112)
Creatinine, Ser: 0.98 mg/dL (ref 0.50–1.10)
GFR calc non Af Amer: 75 mL/min — ABNORMAL LOW (ref >90)
GFR, EST AFRICAN AMERICAN: 87 mL/min — AB (ref >90)
GLUCOSE: 80 mg/dL (ref 70–99)
Glucose, Bld: 67 mg/dL — ABNORMAL LOW (ref 70–99)
Potassium: 4.1 mEq/L (ref 3.5–5.3)
Potassium: 4 mEq/L (ref 3.7–5.3)
SODIUM: 136 meq/L (ref 135–145)
Sodium: 135 mEq/L — ABNORMAL LOW (ref 137–147)

## 2014-05-22 LAB — PROLACTIN: PROLACTIN: 225 ng/mL — AB

## 2014-05-22 LAB — LIPASE, BLOOD: Lipase: 46 U/L (ref 11–59)

## 2014-05-22 MED ORDER — DIPHENOXYLATE-ATROPINE 2.5-0.025 MG PO TABS
2.0000 | ORAL_TABLET | Freq: Once | ORAL | Status: AC
  Administered 2014-05-22: 2 via ORAL
  Filled 2014-05-22: qty 2

## 2014-05-22 MED ORDER — ONDANSETRON HCL 4 MG/2ML IJ SOLN
4.0000 mg | Freq: Once | INTRAMUSCULAR | Status: AC
  Administered 2014-05-22: 4 mg via INTRAVENOUS
  Filled 2014-05-22: qty 2

## 2014-05-22 MED ORDER — ONDANSETRON 4 MG PO TBDP: 4.0000 mg | ORAL_TABLET | Freq: Three times a day (TID) | ORAL | Status: DC | PRN

## 2014-05-22 MED ORDER — DIPHENOXYLATE-ATROPINE 2.5-0.025 MG PO TABS
1.0000 | ORAL_TABLET | Freq: Four times a day (QID) | ORAL | Status: DC | PRN
Start: 2014-05-22 — End: 2014-05-25

## 2014-05-22 MED ORDER — SODIUM CHLORIDE 0.9 % IV BOLUS (SEPSIS)
1000.0000 mL | Freq: Once | INTRAVENOUS | Status: AC
  Administered 2014-05-22: 1000 mL via INTRAVENOUS

## 2014-05-22 NOTE — ED Notes (Signed)
Initial contact-pt A&Ox4. Moving all extremities equally. C/o diarrhea x4 days-"too many occurences to count." Last emesis occurrence was last night. Has had decreased appetite and oral intake. Has not taken any medications for symptoms. Denies blood in diarrhea or vomit. C/o lower abdominal pain underneath navel. No other complaints. Has not been around anyone sick. Denies F, chills, chest pain. In NAD. MD Fayrene Fearing at bedside.

## 2014-05-22 NOTE — ED Provider Notes (Addendum)
CSN: [id number]     Arrival date & time 05/22/14  1110 History   First MD Initiated Contact with Patient 05/22/14 1123     Chief Complaint  Patient presents with  . Nausea  . Emesis  . 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 Presents with four-day history of diarrhea. Also has vomiting that started yesterday and today. She started Neurontin on Monday night for her chronic headaches. States she feels like it made her sleepy in the mornings it feels like it has increased her appetite. Has never taken it before. Takes Linzess for irritable bowel syndrome. Had not had a bowel movement 21st on Tuesday, so she took an extra dose of Linzess. Has had diarrhea for several days since. Vomiting today. No fever. No blood pressure mucus in her stools. Heme-negative nonbilious emesis. No dysuria frequency or hematuria. Headaches this week.  Past Medical History  Diagnosis Date  . PTSD (post-traumatic stress disorder)   . Uterine prolapse 2013  . Depression   . Hiatal hernia   . Surgical menopause   . Chronic headache   . Constipation   . Esophageal reflux     no meds  . Osteopenia   . Eating disorder   . Anxiety   . Osteoporosis   . Fibromyalgia    Past Surgical History  Procedure Laterality Date  . Bilateral oophorectomy      bilat  . Rectocele repair    . Cesarean section    . Abdominal hysterectomy     Family History  Problem Relation Age of Onset  . Breast cancer Maternal Grandmother 43  . Breast cancer Paternal Aunt 40  . Celiac disease Paternal Grandmother   . Heart disease Father   . Colon cancer Neg Hx   . Mental illness Mother   . Bipolar disorder Maternal Grandmother   . Mental illness Brother    History  Substance Use Topics  . Smoking status: Never Smoker   . Smokeless tobacco: Never Used  . Alcohol Use: No   OB History   Grav Para Term Preterm Abortions TAB SAB Ect Mult Living   3 3 3       3      Review of  Systems  Constitutional: Negative for fever, chills, diaphoresis, appetite change and fatigue.  HENT: Negative for mouth sores, sore throat and trouble swallowing.   Eyes: Negative for visual disturbance.  Respiratory: Negative for cough, chest tightness, shortness of breath and wheezing.   Cardiovascular: Negative for chest pain.  Gastrointestinal: Positive for nausea, vomiting, diarrhea and constipation. Negative for abdominal pain and abdominal distention.  Endocrine: Negative for polydipsia, polyphagia and polyuria.  Genitourinary: Negative for dysuria, frequency and hematuria.  Musculoskeletal: Negative for gait problem.  Skin: Negative for color change, pallor and rash.  Neurological: Negative for dizziness, syncope, light-headedness and headaches.  Hematological: Does not bruise/bleed easily.  Psychiatric/Behavioral: Negative for behavioral problems and confusion.      Allergies  Gluten meal  Home Medications   Prior to Admission medications   Medication Sig Start Date End Date Taking? Authorizing Provider  buPROPion (WELLBUTRIN XL) 150 MG 24 hr tablet Take 300 mg by mouth every morning. 03/24/14  Yes Cleotis Nipper, MD  cetirizine (ZYRTEC) 10 MG tablet Take 10 mg by mouth daily.   Yes Historical Provider, MD  escitalopram (LEXAPRO) 20 MG tablet Take 1 tablet (20 mg total) by mouth daily. 03/24/14  Yes  Cleotis Nipper, MD  estradiol (ESTRACE) 2 MG tablet Take 2 mg by mouth daily. For hormone replacement (Low estrogen) 07/13/13  Yes Sanjuana Kava, NP  Estradiol (VAGIFEM) 10 MCG TABS vaginal tablet Place 1 tablet (10 mcg total) vaginally 2 (two) times a week. On Mondays and Thursdays: For Vaginal atrophy 07/13/13  Yes Sanjuana Kava, NP  fluticasone (FLONASE) 50 MCG/ACT nasal spray Place 2 sprays into the nose daily as needed for allergies or rhinitis.   Yes Historical Provider, MD  gabapentin (NEURONTIN) 100 MG capsule Take 100 mg by mouth at bedtime.   Yes Historical Provider, MD   ibandronate (BONIVA) 150 MG tablet Take 150 mg by mouth every 30 (thirty) days. Take in the morning with a full glass of water, on an empty stomach, and do not take anything else by mouth or lie down for the next 30 min.   Yes Historical Provider, MD  Linaclotide Karlene Einstein) 145 MCG CAPS capsule Take 1 capsule (145 mcg total) by mouth daily. 04/12/14  Yes Hilarie Fredrickson, MD  LORazepam (ATIVAN) 1 MG tablet Take 1 mg by mouth daily as needed for anxiety.   Yes Historical Provider, MD  mirtazapine (REMERON) 15 MG tablet Take 1 tablet (15 mg total) by mouth at bedtime. 03/24/14 03/24/15 Yes Cleotis Nipper, MD  naproxen sodium (ANAPROX) 220 MG tablet Take 1 tablet (220 mg total) by mouth 2 (two) times daily with a meal. 11/18/13  Yes Dorena Bodo, PA-C  risperiDONE (RISPERDAL) 2 MG tablet Take 1 tablet (2 mg total) by mouth 2 (two) times daily. 03/24/14  Yes Cleotis Nipper, MD  SUMAtriptan (IMITREX) 100 MG tablet Take one pill as needed for migraine attack. Do not take more than 2 times per week. 05/13/14  Yes Huston Foley, MD  topiramate (TOPAMAX) 100 MG tablet Take 100-150 mg by mouth See admin instructions. 100mg  in am, 150mg  in pm   Yes Historical Provider, MD  diphenoxylate-atropine (LOMOTIL) 2.5-0.025 MG per tablet Take 1 tablet by mouth 4 (four) times daily as needed for diarrhea or loose stools. 05/22/14   01-29-1991, MD  ondansetron (ZOFRAN ODT) 4 MG disintegrating tablet Take 1 tablet (4 mg total) by mouth every 8 (eight) hours as needed for nausea. 05/22/14   Rolland Porter, MD   BP 107/66  Pulse 79  Temp(Src) 98.3 F (36.8 C) (Oral)  Resp 18  SpO2 100%  LMP 12/14/2003 Physical Exam  Constitutional: She is oriented to person, place, and time. She appears well-developed and well-nourished. No distress.  HENT:  Head: Normocephalic.  Diabetes membranes of the mouth  Eyes: Conjunctivae are normal. Pupils are equal, round, and reactive to light. No scleral icterus.  Neck: Normal range of motion. Neck supple. No  thyromegaly present.  Cardiovascular: Normal rate and regular rhythm.  Exam reveals no gallop and no friction rub.   No murmur heard. Pulmonary/Chest: Effort normal and breath sounds normal. No respiratory distress. She has no wheezes. She has no rales.  Abdominal: Soft. Bowel sounds are normal. She exhibits no distension. There is no tenderness. There is no rebound.  Soft benign abdomen. No complaint of pain.  Musculoskeletal: Normal range of motion.  Neurological: She is alert and oriented to person, place, and time.  Skin: Skin is warm and dry. No rash noted.  Psychiatric: She has a normal mood and affect. Her behavior is normal.    ED Course  Procedures (including critical care time) Labs Review Labs Reviewed  CBC  WITH DIFFERENTIAL - Abnormal; Notable for the following:    WBC 3.9 (*)    All other components within normal limits  URINALYSIS, ROUTINE W REFLEX MICROSCOPIC - Abnormal; Notable for the following:    Specific Gravity, Urine 1.001 (*)    All other components within normal limits  BASIC METABOLIC PANEL - Abnormal; Notable for the following:    Sodium 135 (*)    GFR calc non Af Amer 75 (*)    GFR calc Af Amer 87 (*)    All other components within normal limits  LIPASE, BLOOD    Imaging Review No results found.   EKG Interpretation None      MDM   Final diagnoses:  Gastroenteritis    I doubt that 4 days of diarrhea is secondary to one extra dose of Linzess. Plan will be rehydration. Lab valuation. Antiemetic, antidiarrheals, reevaluation.  Reassuring labs.  Sx resolved.  Taking PO without difficulty.     Rolland Porter, MD 05/22/14 1137  Rolland Porter, MD 05/22/14 1319  Rolland Porter, MD 05/22/14 [phone]

## 2014-05-22 NOTE — ED Notes (Signed)
Pt is aware that a urine sample is needed.  

## 2014-05-22 NOTE — Discharge Instructions (Signed)
Viral Gastroenteritis Viral gastroenteritis is also called stomach flu. This illness is caused by a certain type of germ (virus). It can cause sudden watery poop (diarrhea) and throwing up (vomiting). This can cause you to lose body fluids (dehydration). This illness usually lasts for 3 to 8 days. It usually goes away on its own. HOME CARE   Drink enough fluids to keep your pee (urine) clear or pale yellow. Drink small amounts of fluids often.  Ask your doctor how to replace body fluid losses (rehydration).  Avoid:  Foods high in sugar.  Alcohol.  Bubbly (carbonated) drinks.  Tobacco.  Juice.  Caffeine drinks.  Very hot or cold fluids.  Fatty, greasy foods.  Eating too much at one time.  Dairy products until 24 to 48 hours after your watery poop stops.  You may eat foods with active cultures (probiotics). They can be found in some yogurts and supplements.  Wash your hands well to avoid spreading the illness.  Only take medicines as told by your doctor. Do not give aspirin to children. Do not take medicines for watery poop (antidiarrheals).  Ask your doctor if you should keep taking your regular medicines.  Keep all doctor visits as told. GET HELP RIGHT AWAY IF:   You cannot keep fluids down.  You do not pee at least once every 6 to 8 hours.  You are short of breath.  You see blood in your poop or throw up. This may look like coffee grounds.  You have belly (abdominal) pain that gets worse or is just in one small spot (localized).  You keep throwing up or having watery poop.  You have a fever.  The patient is a child younger than 3 months, and he or she has a fever.  The patient is a child older than 3 months, and he or she has a fever and problems that do not go away.  The patient is a child older than 3 months, and he or she has a fever and problems that suddenly get worse.  The patient is a baby, and he or she has no tears when crying. MAKE SURE YOU:     Understand these instructions.  Will watch your condition.  Will get help right away if you are not doing well or get worse. Document Released: 04/16/2008 Document Revised: 01/21/2012 Document Reviewed: 08/15/2011 [employer] Patient Information 2015 [employer]. This information is not intended to replace advice given to you by your health care provider. Make sure you discuss any questions you have with your health care provider.  

## 2014-05-22 NOTE — ED Notes (Signed)
Pt states that she has had diarrhea x 4 days and started having NV yesterday.  Mid abd pain.

## 2014-05-23 LAB — WOUND CULTURE
GRAM STAIN: NONE SEEN
Gram Stain: NONE SEEN
Gram Stain: NONE SEEN

## 2014-05-25 ENCOUNTER — Other Ambulatory Visit: Payer: Self-pay | Admitting: *Deleted

## 2014-05-25 ENCOUNTER — Ambulatory Visit (INDEPENDENT_AMBULATORY_CARE_PROVIDER_SITE_OTHER): Payer: Medicare Other | Admitting: Internal Medicine

## 2014-05-25 ENCOUNTER — Other Ambulatory Visit: Payer: Self-pay | Admitting: Family Medicine

## 2014-05-25 ENCOUNTER — Encounter: Payer: Self-pay | Admitting: Internal Medicine

## 2014-05-25 VITALS — BP 102/70 | HR 84 | Ht 62.0 in | Wt 135.0 lb

## 2014-05-25 DIAGNOSIS — K589 Irritable bowel syndrome without diarrhea: Secondary | ICD-10-CM

## 2014-05-25 DIAGNOSIS — G43909 Migraine, unspecified, not intractable, without status migrainosus: Secondary | ICD-10-CM | POA: Diagnosis not present

## 2014-05-25 DIAGNOSIS — K5900 Constipation, unspecified: Secondary | ICD-10-CM

## 2014-05-25 DIAGNOSIS — N643 Galactorrhea not associated with childbirth: Secondary | ICD-10-CM

## 2014-05-25 DIAGNOSIS — N644 Mastodynia: Secondary | ICD-10-CM

## 2014-05-25 MED ORDER — LINACLOTIDE 145 MCG PO CAPS: 145.0000 ug | ORAL_CAPSULE | Freq: Every day | ORAL | Status: AC

## 2014-05-25 NOTE — Progress Notes (Signed)
HISTORY OF PRESENT ILLNESS:  Regina Steele is a 33 y.o. female , previous GI patient of Dr. Sheryn Bison, who presents today for routine followup. Her principal problem has been constipation. She was diagnosed with constipation predominant IBS as well as possible GERD. She underwent both upper endoscopy and colonoscopy in May of 2011. Colonoscopy was normal. Upper endoscopy was normal except for a hiatal hernia. She was last seen in the office May of 2014. At that time she was doing well on Linzess 145 mcg daily. She presents today for routine followup. Her interval medical history is that of fibromyalgia. Her GI review of systems today is negative except for 10 pound weight gain. She is accompanied by her daughter. She reports that Linzess continues to work Adult nurse. She has bowel movements daily without diarrhea. No obvious medication side effects. She requested a refill of Linzess.  REVIEW OF SYSTEMS:  All non-GI ROS negative except for anxiety, depression, fatigue, headaches, muscle pains, night sweats  Past Medical History  Diagnosis Date  . PTSD (post-traumatic stress disorder)   . Uterine prolapse 2013  . Depression   . Hiatal hernia   . Surgical menopause   . Chronic headache   . Constipation   . Esophageal reflux     no meds  . Osteopenia   . Eating disorder   . Anxiety   . Osteoporosis   . Fibromyalgia     Past Surgical History  Procedure Laterality Date  . Bilateral oophorectomy      bilat  . Rectocele repair    . Cesarean section    . Abdominal hysterectomy      Social History Regina Steele  reports that she has never smoked. She has never used smokeless tobacco. She reports that she does not drink alcohol or use illicit drugs.  family history includes Bipolar disorder in her maternal grandmother; Breast cancer (age of onset: 69) in her paternal aunt; Breast cancer (age of onset: 48) in her maternal grandmother; Celiac disease in her paternal  grandmother; Heart disease in her father; Mental illness in her brother and mother. There is no history of Colon cancer.  Allergies  Allergen Reactions  . Gluten Meal     Bloating        PHYSICAL EXAMINATION: Vital signs: BP 102/70  Pulse 84  Ht 5\' 2"  (1.575 m)  Wt 135 lb (61.236 kg)  BMI 24.69 kg/m2  LMP 12/14/2003 General: Well-developed, well-nourished, no acute distress HEENT: Sclerae are anicteric, conjunctiva pink. Oral mucosa intact Lungs: Clear Heart: Regular Abdomen: soft, nontender, nondistended, no obvious ascites, no peritoneal signs, normal bowel sounds. No organomegaly. Extremities: No edema Psychiatric: alert and oriented x3. Cooperative    ASSESSMENT:  #1. Constipation predominant IBS. Doing well on Linzess #2. Multiple general medical problems   PLAN:  #1. Refill Linzess prescription #2. Routine followup in 1 year. Sooner if needed #3. Continue general medical care with PCP

## 2014-05-25 NOTE — Patient Instructions (Signed)
We have sent the following medications to your pharmacy for you to pick up at your convenience:  Linzess  Please follow up with Dr. Marina Goodell in 1-2 years.

## 2014-05-26 ENCOUNTER — Telehealth: Payer: Self-pay | Admitting: *Deleted

## 2014-05-26 NOTE — Telephone Encounter (Signed)
Received call from patient.   Reports that she was seen in ER on 05/22/2014. Reports that GFR noted at 75 at this time.  Requested to have MD follow kidney function.   Also states that her mother's side of the family (great grandmother, grandmother, mother, and aunt) have had breast cancer dx. Reports that patient mother states she should have breast MRI to make sure that there is no cancer. Advised that mammogram is generally performed first, then the MRI if needed.   MD please advise.

## 2014-05-27 NOTE — Telephone Encounter (Signed)
I am not sure how I am PCP as I have not seen this patient in epic.  I would recommend mammogram.

## 2014-05-27 NOTE — Telephone Encounter (Signed)
Routed to proper provider.

## 2014-05-28 NOTE — Telephone Encounter (Signed)
Pt already has some imaging for the breast, she was just seen, nothing else needs to be done at this time

## 2014-05-28 NOTE — Telephone Encounter (Signed)
Call placed to patient and patient made aware.  

## 2014-05-31 ENCOUNTER — Other Ambulatory Visit (HOSPITAL_COMMUNITY): Payer: Self-pay | Admitting: *Deleted

## 2014-05-31 DIAGNOSIS — F319 Bipolar disorder, unspecified: Secondary | ICD-10-CM

## 2014-05-31 MED ORDER — BUPROPION HCL ER (XL) 300 MG PO TB24: 300.0000 mg | ORAL_TABLET | Freq: Every day | ORAL | Status: DC

## 2014-05-31 NOTE — Telephone Encounter (Signed)
Patient left VM:Dr. Arfeen increased her Wellbutrin from 150 mg to 300 mg by phone consulation. No prescription for Wellbutrin 300mg  has been called in yet.She is almost out of 150 mg.  Per Dr. order, called in Wellbutrin XL 300 mg daily to [facility]  Notified patient of RX called in.

## 2014-06-01 ENCOUNTER — Ambulatory Visit
Admission: RE | Admit: 2014-06-01 | Discharge: 2014-06-01 | Disposition: A | Discharge status: Home / Self Care | Payer: Medicare Other | Source: Ambulatory Visit | Attending: Neurology | Admitting: Neurology

## 2014-06-01 DIAGNOSIS — G4489 Other headache syndrome: Secondary | ICD-10-CM

## 2014-06-01 DIAGNOSIS — R51 Headache: Secondary | ICD-10-CM | POA: Diagnosis not present

## 2014-06-06 ENCOUNTER — Emergency Department (HOSPITAL_COMMUNITY)
Admission: EM | Admit: 2014-06-06 | Discharge: 2014-06-06 | Discharge status: Against Medical Advice | Payer: Medicare Other | Attending: Emergency Medicine | Admitting: Emergency Medicine

## 2014-06-06 ENCOUNTER — Encounter (HOSPITAL_COMMUNITY): Payer: Self-pay | Admitting: Emergency Medicine

## 2014-06-06 DIAGNOSIS — G43909 Migraine, unspecified, not intractable, without status migrainosus: Secondary | ICD-10-CM | POA: Diagnosis not present

## 2014-06-06 NOTE — ED Notes (Signed)
Pt c/o migraine since Friday.  Hx of migraine.  Also c/o vomiting.  Pt states that she has been taking imitrex w/ no relief.

## 2014-06-06 NOTE — ED Notes (Signed)
Pt called for triage x2 without response 

## 2014-06-06 NOTE — ED Notes (Signed)
Pt called and no response 

## 2014-06-07 ENCOUNTER — Ambulatory Visit
Admission: RE | Admit: 2014-06-07 | Discharge: 2014-06-07 | Disposition: A | Discharge status: Home / Self Care | Payer: Medicare Other | Source: Ambulatory Visit | Attending: Family Medicine | Admitting: Family Medicine

## 2014-06-07 DIAGNOSIS — N644 Mastodynia: Secondary | ICD-10-CM

## 2014-06-07 DIAGNOSIS — N643 Galactorrhea not associated with childbirth: Secondary | ICD-10-CM

## 2014-06-07 DIAGNOSIS — N6459 Other signs and symptoms in breast: Secondary | ICD-10-CM | POA: Diagnosis not present

## 2014-06-08 ENCOUNTER — Telehealth (HOSPITAL_COMMUNITY): Payer: Self-pay | Admitting: Psychiatry

## 2014-06-08 ENCOUNTER — Telehealth (HOSPITAL_COMMUNITY): Payer: Self-pay

## 2014-06-08 NOTE — Telephone Encounter (Signed)
I returned call. She has breast discharge which could be due to Risperdal. She also has Imaging studies and results are pending.  She is not interested to change the medication since results of imaging are not available.  However I explained if it is caused by Risperdal then she can try Abilify.  We will wait for the results of nero imaging results.

## 2014-06-08 NOTE — Telephone Encounter (Signed)
I returned the call of Dr. Jeanice Lim and left a message

## 2014-06-11 ENCOUNTER — Telehealth (HOSPITAL_COMMUNITY): Payer: Self-pay

## 2014-06-12 DIAGNOSIS — H04129 Dry eye syndrome of unspecified lacrimal gland: Secondary | ICD-10-CM | POA: Diagnosis not present

## 2014-06-12 DIAGNOSIS — H5210 Myopia, unspecified eye: Secondary | ICD-10-CM | POA: Diagnosis not present

## 2014-06-12 DIAGNOSIS — H16149 Punctate keratitis, unspecified eye: Secondary | ICD-10-CM | POA: Diagnosis not present

## 2014-06-12 DIAGNOSIS — H40019 Open angle with borderline findings, low risk, unspecified eye: Secondary | ICD-10-CM | POA: Diagnosis not present

## 2014-06-15 ENCOUNTER — Other Ambulatory Visit (HOSPITAL_COMMUNITY): Payer: Self-pay | Admitting: Psychiatry

## 2014-06-15 NOTE — Telephone Encounter (Signed)
I return to Dr. Jeanice Lim phone call.  Patient is complaining of breast discharge which could be due to Risperdal.  Recommended to switch Abilify.  Recommended to do cross taper the Abilify 5 mg one week and then 10 mg daily.  Reduce Risperdal 1 mg for one week and stop after one week.  If there is any question concerns or side effects call us back.

## 2014-06-16 ENCOUNTER — Telehealth: Payer: Self-pay | Admitting: Family Medicine

## 2014-06-16 MED ORDER — ARIPIPRAZOLE 5 MG PO TABS: 5.0000 mg | ORAL_TABLET | Freq: Every day | ORAL | Status: DC

## 2014-06-16 NOTE — Telephone Encounter (Signed)
Call pt, I spoke with [name] Since the risperdal is causing the nipple discharge, we need to stop this medication and start Abilify   1. Decrease risperdal to 1/2 tablet twice a day for 1 week   Then decrease to 1 tablet at bedtime for 1 week, then stop   2. Start Abilify 5mg  daily tomorrow , after 1 week, increase to 10mg    3. Make sure she has a f/u appt with Dr.   Script has been sent

## 2014-06-16 NOTE — Telephone Encounter (Signed)
Call placed to patient. LMTRC.  

## 2014-06-17 NOTE — Telephone Encounter (Signed)
Call placed to patient and patient made aware.  

## 2014-06-24 ENCOUNTER — Ambulatory Visit (HOSPITAL_COMMUNITY): Payer: Self-pay | Admitting: Psychiatry

## 2014-07-06 ENCOUNTER — Encounter: Payer: Self-pay | Admitting: Family Medicine

## 2014-07-07 ENCOUNTER — Telehealth (HOSPITAL_COMMUNITY): Payer: Self-pay

## 2014-07-07 ENCOUNTER — Ambulatory Visit (INDEPENDENT_AMBULATORY_CARE_PROVIDER_SITE_OTHER): Payer: Medicare Other | Admitting: Psychiatry

## 2014-07-07 ENCOUNTER — Encounter (HOSPITAL_COMMUNITY): Payer: Self-pay | Admitting: Psychiatry

## 2014-07-07 VITALS — BP 117/83 | HR 84 | Ht 62.0 in | Wt 134.0 lb

## 2014-07-07 DIAGNOSIS — F319 Bipolar disorder, unspecified: Secondary | ICD-10-CM

## 2014-07-07 DIAGNOSIS — F4310 Post-traumatic stress disorder, unspecified: Secondary | ICD-10-CM

## 2014-07-07 MED ORDER — MIRTAZAPINE 15 MG PO TABS: 15.0000 mg | ORAL_TABLET | Freq: Every day | ORAL | Status: DC

## 2014-07-07 MED ORDER — ESCITALOPRAM OXALATE 20 MG PO TABS: 20.0000 mg | ORAL_TABLET | Freq: Every day | ORAL | Status: DC

## 2014-07-07 MED ORDER — LORAZEPAM 1 MG PO TABS: 1.0000 mg | ORAL_TABLET | Freq: Every day | ORAL | Status: DC | PRN

## 2014-07-07 MED ORDER — BUPROPION HCL ER (XL) 300 MG PO TB24: 300.0000 mg | ORAL_TABLET | Freq: Every day | ORAL | Status: DC

## 2014-07-07 MED ORDER — TOPIRAMATE 100 MG PO TABS: ORAL_TABLET | ORAL | Status: DC
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
## 2014-07-10 ENCOUNTER — Emergency Department (HOSPITAL_COMMUNITY)
Admission: EM | Admit: 2014-07-10 | Discharge: 2014-07-10 | Disposition: A | Discharge status: Home / Self Care | Payer: Medicare Other | Attending: Emergency Medicine | Admitting: Emergency Medicine

## 2014-07-10 ENCOUNTER — Encounter (HOSPITAL_COMMUNITY): Payer: Self-pay | Admitting: Emergency Medicine

## 2014-07-10 DIAGNOSIS — F3289 Other specified depressive episodes: Secondary | ICD-10-CM | POA: Diagnosis not present

## 2014-07-10 DIAGNOSIS — F411 Generalized anxiety disorder: Secondary | ICD-10-CM | POA: Diagnosis not present

## 2014-07-10 DIAGNOSIS — R358 Other polyuria: Secondary | ICD-10-CM | POA: Diagnosis not present

## 2014-07-10 DIAGNOSIS — R631 Polydipsia: Secondary | ICD-10-CM | POA: Diagnosis not present

## 2014-07-10 DIAGNOSIS — G8929 Other chronic pain: Secondary | ICD-10-CM | POA: Diagnosis not present

## 2014-07-10 DIAGNOSIS — Z8719 Personal history of other diseases of the digestive system: Secondary | ICD-10-CM | POA: Diagnosis not present

## 2014-07-10 DIAGNOSIS — R5381 Other malaise: Secondary | ICD-10-CM | POA: Diagnosis not present

## 2014-07-10 DIAGNOSIS — R5383 Other fatigue: Secondary | ICD-10-CM

## 2014-07-10 DIAGNOSIS — Z8739 Personal history of other diseases of the musculoskeletal system and connective tissue: Secondary | ICD-10-CM | POA: Insufficient documentation

## 2014-07-10 DIAGNOSIS — R110 Nausea: Secondary | ICD-10-CM | POA: Diagnosis not present

## 2014-07-10 DIAGNOSIS — R531 Weakness: Secondary | ICD-10-CM

## 2014-07-10 DIAGNOSIS — IMO0001 Reserved for inherently not codable concepts without codable children: Secondary | ICD-10-CM | POA: Insufficient documentation

## 2014-07-10 DIAGNOSIS — Z7982 Long term (current) use of aspirin: Secondary | ICD-10-CM | POA: Diagnosis not present

## 2014-07-10 DIAGNOSIS — R350 Frequency of micturition: Secondary | ICD-10-CM | POA: Diagnosis not present

## 2014-07-10 DIAGNOSIS — Z3202 Encounter for pregnancy test, result negative: Secondary | ICD-10-CM | POA: Diagnosis not present

## 2014-07-10 DIAGNOSIS — F329 Major depressive disorder, single episode, unspecified: Secondary | ICD-10-CM | POA: Insufficient documentation

## 2014-07-10 DIAGNOSIS — Z79899 Other long term (current) drug therapy: Secondary | ICD-10-CM | POA: Diagnosis not present

## 2014-07-10 DIAGNOSIS — R3589 Other polyuria: Secondary | ICD-10-CM | POA: Diagnosis not present

## 2014-07-10 DIAGNOSIS — Z8742 Personal history of other diseases of the female genital tract: Secondary | ICD-10-CM | POA: Insufficient documentation

## 2014-07-10 LAB — URINALYSIS, ROUTINE W REFLEX MICROSCOPIC
Bilirubin Urine: NEGATIVE
Glucose, UA: NEGATIVE mg/dL
Hgb urine dipstick: NEGATIVE
Ketones, ur: NEGATIVE mg/dL
Leukocytes, UA: NEGATIVE
Nitrite: NEGATIVE
Protein, ur: NEGATIVE mg/dL
SPECIFIC GRAVITY, URINE: 1.004 — AB (ref 1.005–1.030)
UROBILINOGEN UA: 0.2 mg/dL (ref 0.0–1.0)
pH: 7.5 (ref 5.0–8.0)

## 2014-07-10 LAB — CBC WITH DIFFERENTIAL/PLATELET
BASOS ABS: 0 10*3/uL (ref 0.0–0.1)
BASOS PCT: 1 % (ref 0–1)
Eosinophils Absolute: 0.2 10*3/uL (ref 0.0–0.7)
Eosinophils Relative: 3 % (ref 0–5)
HEMATOCRIT: 40 % (ref 36.0–46.0)
Hemoglobin: 13.3 g/dL (ref 12.0–15.0)
LYMPHS PCT: 27 % (ref 12–46)
Lymphs Abs: 1.7 10*3/uL (ref 0.7–4.0)
MCH: 29.6 pg (ref 26.0–34.0)
MCHC: 33.3 g/dL (ref 30.0–36.0)
MCV: 89.1 fL (ref 78.0–100.0)
MONO ABS: 0.5 10*3/uL (ref 0.1–1.0)
Monocytes Relative: 8 % (ref 3–12)
NEUTROS ABS: 3.9 10*3/uL (ref 1.7–7.7)
Neutrophils Relative %: 61 % (ref 43–77)
Platelets: 261 10*3/uL (ref 150–400)
RBC: 4.49 MIL/uL (ref 3.87–5.11)
RDW: 12.1 % (ref 11.5–15.5)
WBC: 6.3 10*3/uL (ref 4.0–10.5)

## 2014-07-10 LAB — COMPREHENSIVE METABOLIC PANEL
ALBUMIN: 4.1 g/dL (ref 3.5–5.2)
ALT: 11 U/L (ref 0–35)
AST: 18 U/L (ref 0–37)
Alkaline Phosphatase: 35 U/L — ABNORMAL LOW (ref 39–117)
Anion gap: 13 (ref 5–15)
BILIRUBIN TOTAL: 0.2 mg/dL — AB (ref 0.3–1.2)
BUN: 9 mg/dL (ref 6–23)
CO2: 21 mEq/L (ref 19–32)
CREATININE: 0.83 mg/dL (ref 0.50–1.10)
Calcium: 9.3 mg/dL (ref 8.4–10.5)
Chloride: 102 mEq/L (ref 96–112)
GFR calc Af Amer: >90 mL/min (ref >90)
GFR calc non Af Amer: >90 mL/min (ref >90)
Glucose, Bld: 100 mg/dL — ABNORMAL HIGH (ref 70–99)
Potassium: 4.6 mEq/L (ref 3.7–5.3)
Sodium: 136 mEq/L — ABNORMAL LOW (ref 137–147)
Total Protein: 7.1 g/dL (ref 6.0–8.3)

## 2014-07-10 LAB — LIPASE, BLOOD: LIPASE: 42 U/L (ref 11–59)

## 2014-07-10 LAB — POC URINE PREG, ED: PREG TEST UR: NEGATIVE

## 2014-07-10 MED ORDER — SODIUM CHLORIDE 0.9 % IV BOLUS (SEPSIS)
1000.0000 mL | Freq: Once | INTRAVENOUS | Status: AC
  Administered 2014-07-10: 1000 mL via INTRAVENOUS

## 2014-07-10 MED ORDER — ONDANSETRON HCL 4 MG/2ML IJ SOLN
4.0000 mg | Freq: Once | INTRAMUSCULAR | Status: AC
  Administered 2014-07-10: 4 mg via INTRAVENOUS
  Filled 2014-07-10: qty 2

## 2014-07-10 MED ORDER — ONDANSETRON HCL 4 MG PO TABS: 4.0000 mg | ORAL_TABLET | Freq: Four times a day (QID) | ORAL | Status: DC

## 2014-07-10 NOTE — ED Notes (Signed)
Pt from home reports nausea, weakness from not being able to eat and has frequent urination but is drinking more d/t dry mouth x4 days. Pt denies dysuria or hematuria. Pt adds that she is having back pain "all over", 4/10 pain. Pt is A&O and in NAD

## 2014-07-10 NOTE — ED Notes (Addendum)
Pt reports frequency in urination but denies color change or odor. Pt reports nausea, decreased appetite, and fatigue. Pt reports lower back pain and relates it to fibromyalgia.

## 2014-07-10 NOTE — Discharge Instructions (Signed)
Please follow up with your PCP in 2 days to be rechecked.  Weakness Weakness is a lack of strength. You may feel weak all over your body or just in one part of your body. Weakness can be serious. In some cases, you may need more medical tests. HOME CARE  Rest.  Eat a well-balanced diet.  Try to exercise every day.  Only take medicines as told by your doctor. GET HELP RIGHT AWAY IF:   You cannot do your normal daily activities.  You cannot walk up and down stairs, or you feel very tired when you do so.  You have shortness of breath or chest pain.  You have trouble moving parts of your body.  You have weakness in only one body part or on only one side of the body.  You have a fever.  You have trouble speaking or swallowing.  You cannot control when you pee (urinate) or poop (bowel movement).  You have black or bloody throw up (vomit) or poop.  Your weakness gets worse or spreads to other body parts.  You have new aches or pains. MAKE SURE YOU:   Understand these instructions.  Will watch your condition.  Will get help right away if you are not doing well or get worse. Document Released: 10/11/2008 Document Revised: 04/29/2012 Document Reviewed: 12/28/2011 [employer] Patient Information 2015 [place], [place]. This information is not intended to replace advice given to you by your health care provider. Make sure you discuss any questions you have with your health care provider.

## 2014-07-10 NOTE — ED Provider Notes (Signed)
CSN: [id number]     Arrival date & time 07/10/14  1246 History   First MD Initiated Contact with Patient 07/10/14 1315     Chief Complaint  Patient presents with  . Nausea  . Urinary Frequency  . Weakness     (Consider location/radiation/quality/duration/timing/severity/associated sxs/prior Treatment) HPI Comments: Patient is a 33 year old female with history of PTSD, depression, osteoporosis, fibromyalgia who presents to the emergency department today with multiple complaints. She reports "I'm dehydrated and need an IV". She reports that she has not been eating anything other than fruit since Wednesday. She has lower abdominal pain. She has associated nausea and generalized weakness. She has frequent urination. She has been drinking more than normal, but has had dry mouth. She denies dysuria, hematuria, vaginal discharge, vaginal bleeding.  Patient is a 33 y.o. female presenting with frequency and weakness. The history is provided by the patient. No language interpreter was used.  Urinary Frequency Associated symptoms include myalgias, nausea and weakness (generalized). Pertinent negatives include no abdominal pain, chest pain, chills, fever or vomiting.  Weakness Associated symptoms include myalgias, nausea and weakness (generalized). Pertinent negatives include no abdominal pain, chest pain, chills, fever or vomiting.    Past Medical History  Diagnosis Date  . PTSD (post-traumatic stress disorder)   . Uterine prolapse 2013  . Depression   . Hiatal hernia   . Surgical menopause   . Chronic headache   . Constipation   . Esophageal reflux     no meds  . Osteopenia   . Eating disorder   . Anxiety   . Osteoporosis   . Fibromyalgia    Past Surgical History  Procedure Laterality Date  . Bilateral oophorectomy      bilat  . Rectocele repair    . Cesarean section    . Abdominal hysterectomy     Family History  Problem Relation Age of Onset  . Breast cancer Maternal  Grandmother 16  . Breast cancer Paternal Aunt 40  . Celiac disease Paternal Grandmother   . Heart disease Father   . Colon cancer Neg Hx   . Mental illness Mother   . Bipolar disorder Maternal Grandmother   . Mental illness Brother    History  Substance Use Topics  . Smoking status: Never Smoker   . Smokeless tobacco: Never Used  . Alcohol Use: No   OB History   Grav Para Term Preterm Abortions TAB SAB Ect Mult Living   3 3 3       3      Review of Systems  Constitutional: Negative for fever and chills.  Respiratory: Negative for shortness of breath.   Cardiovascular: Negative for chest pain.  Gastrointestinal: Positive for nausea. Negative for vomiting, abdominal pain and constipation.  Endocrine: Positive for polydipsia and polyuria.  Genitourinary: Positive for frequency. Negative for dysuria.  Musculoskeletal: Positive for myalgias.  Neurological: Positive for weakness (generalized).  All other systems reviewed and are negative.     Allergies  Gluten meal  Home Medications   Prior to Admission medications   Medication Sig Start Date End Date Taking? Authorizing Provider  aspirin 325 MG tablet Take 650 mg by mouth every 4 (four) hours as needed for mild pain.   Yes Historical Provider, MD  buPROPion (WELLBUTRIN XL) 300 MG 24 hr tablet Take 300 mg by mouth daily with breakfast.   Yes Historical Provider, MD  cetirizine (ZYRTEC) 10 MG tablet Take 10 mg by mouth daily.   Yes Historical Provider, MD  escitalopram (LEXAPRO) 20 MG tablet Take 1 tablet (20 mg total) by mouth daily. 07/07/14  Yes Cleotis Nipper, MD  estradiol (ESTRACE) 2 MG tablet Take 2 mg by mouth daily. For hormone replacement (Low estrogen) 07/13/13  Yes Sanjuana Kava, NP  Estradiol (VAGIFEM) 10 MCG TABS vaginal tablet Place 1 tablet (10 mcg total) vaginally 2 (two) times a week. On Mondays and Thursdays: For Vaginal atrophy 07/13/13  Yes Sanjuana Kava, NP  fluticasone (FLONASE) 50 MCG/ACT nasal spray Place 2  sprays into the nose daily as needed for allergies or rhinitis.   Yes Historical Provider, MD  ibandronate (BONIVA) 150 MG tablet Take 150 mg by mouth every 30 (thirty) days. Take in the morning with a full glass of water, on an empty stomach, and do not take anything else by mouth or lie down for the next 30 min.   Yes Historical Provider, MD  Linaclotide Karlene Einstein) 145 MCG CAPS capsule Take 1 capsule (145 mcg total) by mouth daily. 05/25/14  Yes Hilarie Fredrickson, MD  LORazepam (ATIVAN) 1 MG tablet Take 1 tablet (1 mg total) by mouth daily as needed for anxiety. 07/07/14  Yes Cleotis Nipper, MD  mirtazapine (REMERON) 15 MG tablet Take 1 tablet (15 mg total) by mouth at bedtime. 07/07/14 07/07/15 Yes Cleotis Nipper, MD  naproxen sodium (ANAPROX) 220 MG tablet Take 1 tablet (220 mg total) by mouth 2 (two) times daily with a meal. 11/18/13  Yes Dorena Bodo, PA-C  SUMAtriptan (IMITREX) 100 MG tablet Take one pill as needed for migraine attack. Do not take more than 2 times per week. 05/13/14  Yes Huston Foley, MD  topiramate (TOPAMAX) 100 MG tablet Take 250 mg by mouth daily with breakfast.   Yes Historical Provider, MD   BP 106/61  Pulse 60  Temp(Src) 98 F (36.7 C) (Oral)  Resp 14  SpO2 100%  LMP 12/14/2003 Physical Exam  Nursing note and vitals reviewed. Constitutional: She is oriented to person, place, and time. She appears well-developed and well-nourished. No distress.  HENT:  Head: Normocephalic and atraumatic.  Right Ear: External ear normal.  Left Ear: External ear normal.  Nose: Nose normal.  Mouth/Throat: Uvula is midline and oropharynx is clear and moist. Mucous membranes are dry.  Dry mucus membranes  Eyes: Conjunctivae are normal.  Neck: Normal range of motion.  No nuchal rigidity or meningeal signs  Cardiovascular: Normal rate, regular rhythm and normal heart sounds.   Pulmonary/Chest: Effort normal and breath sounds normal. No stridor. No respiratory distress. She has no wheezes. She has  no rales.  Abdominal: Soft. She exhibits no distension. There is no tenderness. There is no rigidity and no guarding.  Musculoskeletal: Normal range of motion.  Moves all extremities without ataxia or guarding Tenderness to palpation diffusely over back.   Neurological: She is alert and oriented to person, place, and time. She has normal strength. GCS eye subscore is 4. GCS verbal subscore is 5. GCS motor subscore is 6.  Skin: Skin is warm and dry. She is not diaphoretic. No erythema.  Psychiatric: She has a normal mood and affect. Her behavior is normal.    ED Course  Procedures (including critical care time) Labs Review Labs Reviewed  URINALYSIS, ROUTINE W REFLEX MICROSCOPIC - Abnormal; Notable for the following:    Specific Gravity, Urine 1.004 (*)    All other components within normal limits  COMPREHENSIVE METABOLIC PANEL - Abnormal; Notable for the following:  Sodium 136 (*)    Glucose, Bld 100 (*)    Alkaline Phosphatase 35 (*)    Total Bilirubin 0.2 (*)    All other components within normal limits  URINE CULTURE  CBC WITH DIFFERENTIAL  LIPASE, BLOOD  POC URINE PREG, ED    Imaging Review No results found.   EKG Interpretation None      MDM   Final diagnoses:  Polyuria  Polydipsia  Generalized weakness    Patient presents to ED for evaluation of generalized weakness. Labs are unremarkable. Patient feels improved after fluid bolus and zofran. Discussed with patient the importance of follow up with PCP. She needs recheck labs to ensure patient is not becoming increasingly hyponatremic with her polyuria and polydipsia. Patient understands and will follow up. Discussed reasons to return to ED immediately. Vital signs stable for discharge. Discussed case with Dr. Rhunette Croft who agrees with plan. Patient / Family / Caregiver informed of clinical course, understand medical decision-making process, and agree with plan.    Mora Bellman, PA-C 07/11/14 [phone]

## 2014-07-11 LAB — URINE CULTURE: Colony Count: 50000

## 2014-07-11 NOTE — ED Provider Notes (Signed)
Medical screening examination/treatment/procedure(s) were performed by non-physician practitioner and as supervising physician I was immediately available for consultation/collaboration.   EKG Interpretation None       Derwood Kaplan, MD 07/11/14 1732

## 2014-07-22 ENCOUNTER — Telehealth (HOSPITAL_COMMUNITY): Payer: Self-pay | Admitting: *Deleted

## 2014-07-22 NOTE — Telephone Encounter (Signed)
Patient left VM: Was told by [name] to call if depression increased.Now sleeping 14 hours a day.Not interested in things she is usually interested in.Not motivated - took effort to make this call. Was told by him if these symptoms persistee, he would put her on Zyprexa - she thinks she is ready for it. Phoned pt to let her know [name] out of office for next week - message will be forwarded to MD covering form him

## 2014-07-27 NOTE — Telephone Encounter (Addendum)
Dr. Rutherford Limerick reviewed pt chart/medictions. Per Dr. Rutherford Limerick (in Dr. Sheela Stack absence) orders: Take Lexapro in the evening with the evening dose of Topamax and the Remeron. Try for 7 days to see if this decreases sleeping in daytime. Phoned pt and left this information on voice mail

## 2014-08-04 DIAGNOSIS — F909 Attention-deficit hyperactivity disorder, unspecified type: Secondary | ICD-10-CM | POA: Diagnosis not present

## 2014-08-04 DIAGNOSIS — F39 Unspecified mood [affective] disorder: Secondary | ICD-10-CM | POA: Diagnosis not present

## 2014-08-12 ENCOUNTER — Other Ambulatory Visit (HOSPITAL_COMMUNITY): Payer: Self-pay | Admitting: Psychiatry

## 2014-08-12 DIAGNOSIS — F331 Major depressive disorder, recurrent, moderate: Secondary | ICD-10-CM | POA: Diagnosis not present

## 2014-08-12 DIAGNOSIS — F411 Generalized anxiety disorder: Secondary | ICD-10-CM | POA: Diagnosis not present

## 2014-08-17 DIAGNOSIS — F331 Major depressive disorder, recurrent, moderate: Secondary | ICD-10-CM | POA: Diagnosis not present

## 2014-08-17 DIAGNOSIS — F411 Generalized anxiety disorder: Secondary | ICD-10-CM | POA: Diagnosis not present

## 2014-08-23 DIAGNOSIS — F331 Major depressive disorder, recurrent, moderate: Secondary | ICD-10-CM | POA: Diagnosis not present

## 2014-08-23 DIAGNOSIS — F411 Generalized anxiety disorder: Secondary | ICD-10-CM | POA: Diagnosis not present

## 2014-08-24 ENCOUNTER — Ambulatory Visit (INDEPENDENT_AMBULATORY_CARE_PROVIDER_SITE_OTHER): Payer: Medicare Other | Admitting: Psychiatry

## 2014-08-24 ENCOUNTER — Encounter (HOSPITAL_COMMUNITY): Payer: Self-pay | Admitting: Psychiatry

## 2014-08-24 VITALS — BP 132/82 | HR 69 | Ht 62.0 in | Wt 123.4 lb

## 2014-08-24 DIAGNOSIS — F319 Bipolar disorder, unspecified: Secondary | ICD-10-CM | POA: Diagnosis not present

## 2014-08-24 DIAGNOSIS — F4310 Post-traumatic stress disorder, unspecified: Secondary | ICD-10-CM

## 2014-08-24 MED ORDER — TOPIRAMATE 100 MG PO TABS: ORAL_TABLET | ORAL | Status: DC

## 2014-08-24 MED ORDER — MIRTAZAPINE 15 MG PO TABS: 15.0000 mg | ORAL_TABLET | Freq: Every day | ORAL | Status: DC

## 2014-08-24 MED ORDER — BUPROPION HCL ER (XL) 300 MG PO TB24: 300.0000 mg | ORAL_TABLET | Freq: Every day | ORAL | Status: DC

## 2014-08-24 MED ORDER — ESCITALOPRAM OXALATE 20 MG PO TABS: 20.0000 mg | ORAL_TABLET | Freq: Every day | ORAL | Status: DC
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
## 2014-08-26 ENCOUNTER — Encounter (HOSPITAL_COMMUNITY): Payer: Self-pay | Admitting: Psychology

## 2014-08-26 NOTE — Progress Notes (Signed)
Regina Steele is a 33 y.o. female patient discharged from counseling with this provider as pt chose to f/u with another provider.  Outpatient Therapist Discharge Summary  [name]    [birth year]   Admission Date: 12/18/13    Discharge Date:  12/23/13  Reason for Discharge:  Pt chose to f/u w/ previous provider Diagnosis:  PTSD and Bipolar D/O NOS  Comments:  Pt saw me for one visit as transferred from [name] who transferred to [name].  Pt expressed to [name] that she didn't want to continue w/ me as didn't feel good fit.  Pt therefore followed up with [name] in the [place] office.    [name], LPC

## 2014-08-27 ENCOUNTER — Ambulatory Visit (INDEPENDENT_AMBULATORY_CARE_PROVIDER_SITE_OTHER): Payer: Medicare Other | Admitting: Family Medicine

## 2014-08-27 ENCOUNTER — Encounter: Payer: Self-pay | Admitting: Family Medicine

## 2014-08-27 VITALS — BP 110/68 | HR 64 | Temp 97.8°F | Resp 16 | Ht 62.0 in | Wt 126.0 lb

## 2014-08-27 DIAGNOSIS — M797 Fibromyalgia: Secondary | ICD-10-CM | POA: Diagnosis not present

## 2014-08-27 DIAGNOSIS — Z23 Encounter for immunization: Secondary | ICD-10-CM

## 2014-08-27 DIAGNOSIS — F341 Dysthymic disorder: Secondary | ICD-10-CM

## 2014-08-27 DIAGNOSIS — R5382 Chronic fatigue, unspecified: Secondary | ICD-10-CM

## 2014-08-27 MED ORDER — LEVOCETIRIZINE DIHYDROCHLORIDE 5 MG PO TABS: 5.0000 mg | ORAL_TABLET | Freq: Every evening | ORAL | Status: DC

## 2014-08-27 NOTE — Patient Instructions (Addendum)
Flu shot given Use Biotin mouthrinse for dry mouth New allergy medication F/U 4 month

## 2014-08-29 NOTE — Assessment & Plan Note (Signed)
Continue gabapentin.

## 2014-08-29 NOTE — Progress Notes (Signed)
Patient ID: Regina Steele, female   DOB: 1981-01-20, 33 y.o.   MRN: [id number]   Subjective:    Patient ID: Regina Steele, female    DOB: 06/18/1981, 33 y.o.   MRN: [id number]  Patient presents for Follow up from Rheumotology  Pt here for f/u, she states she is doing better, does not feel as fatigued as previous but still trying to imprve herself. Dx- fibromyalgia, currently on gabapentin given by neurology for migraines as well which is helping both. She is not exercising and staying in her home most of the day. She tends to nap until mid-day.  Still followed by psychiatry, was taken off risperdal due to galactorrhea feels better off medications.    Review Of Systems:  GEN- + fatigue, fever, weight loss,weakness, recent illness HEENT- denies eye drainage, change in vision, nasal discharge, CVS- denies chest pain, palpitations RESP- denies SOB, cough, wheeze ABD- denies N/V, change in stools, abd pain GU- denies dysuria, hematuria, dribbling, incontinence MSK- denies joint pain, muscle aches, injury Neuro- denies headache, dizziness, syncope, seizure activity       Objective:    BP 110/68  Pulse 64  Temp(Src) 97.8 F (36.6 C) (Oral)  Resp 16  Ht 5\' 2"  (1.575 m)  Wt 126 lb (57.153 kg)  BMI 23.04 kg/m2  LMP 12/14/2003 GEN- NAD, alert and oriented x3 HEENT- PERRL, EOMI, non injected sclera, pink conjunctiva, MMM, oropharynx clear CVS- RRR, no murmur RESP-CTAB Psych- depressed flat affect, no SI, well groomed, not anxious appearing EXT- No edema Pulses- Radial 2+        Assessment & Plan:      Problem List Items Addressed This Visit   None    Visit Diagnoses   Need for prophylactic vaccination and inoculation against influenza    -  Primary    Relevant Medications       gabapentin (NEURONTIN) 100 MG capsule       levocetirizine (XYZAL) tablet 5 mg    Other Relevant Orders       Flu Vaccine QUAD 36+ mos PF IM (Fluarix Quad PF) (Completed)       Note:  This dictation was prepared with Dragon dictation along with smaller phrase technology. Any transcriptional errors that result from this process are unintentional.

## 2014-08-29 NOTE — Assessment & Plan Note (Signed)
I see some improvement in mood and reactions today in office, continue to encourage, f/u with psychiatry/therapy Discussed her daily routine, encouraged out of bed setting a routine for herself, daily walks, getting involved in her church

## 2014-08-30 DIAGNOSIS — F331 Major depressive disorder, recurrent, moderate: Secondary | ICD-10-CM | POA: Diagnosis not present

## 2014-08-30 DIAGNOSIS — F411 Generalized anxiety disorder: Secondary | ICD-10-CM | POA: Diagnosis not present

## 2014-09-09 DIAGNOSIS — F411 Generalized anxiety disorder: Secondary | ICD-10-CM | POA: Diagnosis not present

## 2014-09-09 DIAGNOSIS — F331 Major depressive disorder, recurrent, moderate: Secondary | ICD-10-CM | POA: Diagnosis not present

## 2014-09-13 ENCOUNTER — Encounter: Payer: Self-pay | Admitting: Family Medicine

## 2014-09-14 DIAGNOSIS — F331 Major depressive disorder, recurrent, moderate: Secondary | ICD-10-CM | POA: Diagnosis not present

## 2014-09-14 DIAGNOSIS — F411 Generalized anxiety disorder: Secondary | ICD-10-CM | POA: Diagnosis not present

## 2014-09-21 DIAGNOSIS — F331 Major depressive disorder, recurrent, moderate: Secondary | ICD-10-CM | POA: Diagnosis not present

## 2014-09-21 DIAGNOSIS — F411 Generalized anxiety disorder: Secondary | ICD-10-CM | POA: Diagnosis not present

## 2014-09-24 ENCOUNTER — Telehealth: Payer: Self-pay | Admitting: Family Medicine

## 2014-09-24 ENCOUNTER — Encounter: Payer: Self-pay | Admitting: Family Medicine

## 2014-09-24 ENCOUNTER — Ambulatory Visit (INDEPENDENT_AMBULATORY_CARE_PROVIDER_SITE_OTHER): Payer: Medicare Other | Admitting: Family Medicine

## 2014-09-24 VITALS — BP 104/78 | HR 80 | Temp 98.5°F | Resp 18 | Wt 120.0 lb

## 2014-09-24 DIAGNOSIS — E349 Endocrine disorder, unspecified: Secondary | ICD-10-CM | POA: Diagnosis not present

## 2014-09-24 DIAGNOSIS — R300 Dysuria: Secondary | ICD-10-CM

## 2014-09-24 DIAGNOSIS — R7989 Other specified abnormal findings of blood chemistry: Secondary | ICD-10-CM

## 2014-09-24 DIAGNOSIS — E229 Hyperfunction of pituitary gland, unspecified: Secondary | ICD-10-CM

## 2014-09-24 DIAGNOSIS — R634 Abnormal weight loss: Secondary | ICD-10-CM

## 2014-09-24 LAB — CBC WITH DIFFERENTIAL/PLATELET
Basophils Absolute: 0 10*3/uL (ref 0.0–0.1)
Basophils Relative: 1 % (ref 0–1)
Eosinophils Absolute: 0.1 10*3/uL (ref 0.0–0.7)
Eosinophils Relative: 2 % (ref 0–5)
HCT: 42.9 % (ref 36.0–46.0)
HEMOGLOBIN: 14.4 g/dL (ref 12.0–15.0)
LYMPHS ABS: 1.5 10*3/uL (ref 0.7–4.0)
Lymphocytes Relative: 35 % (ref 12–46)
MCH: 29.9 pg (ref 26.0–34.0)
MCHC: 33.6 g/dL (ref 30.0–36.0)
MCV: 89 fL (ref 78.0–100.0)
MONOS PCT: 6 % (ref 3–12)
Monocytes Absolute: 0.3 10*3/uL (ref 0.1–1.0)
NEUTROS ABS: 2.4 10*3/uL (ref 1.7–7.7)
NEUTROS PCT: 56 % (ref 43–77)
Platelets: 321 10*3/uL (ref 150–400)
RBC: 4.82 MIL/uL (ref 3.87–5.11)
RDW: 13.3 % (ref 11.5–15.5)
WBC: 4.2 10*3/uL (ref 4.0–10.5)

## 2014-09-24 LAB — URINALYSIS, ROUTINE W REFLEX MICROSCOPIC
Bilirubin Urine: NEGATIVE
GLUCOSE, UA: NEGATIVE mg/dL
Hgb urine dipstick: NEGATIVE
Ketones, ur: NEGATIVE mg/dL
LEUKOCYTES UA: NEGATIVE
Nitrite: NEGATIVE
PH: 5.5 (ref 5.0–8.0)
Protein, ur: NEGATIVE mg/dL
Urobilinogen, UA: 0.2 mg/dL (ref 0.0–1.0)

## 2014-09-24 LAB — COMPREHENSIVE METABOLIC PANEL
ALK PHOS: 42 U/L (ref 39–117)
ALT: 14 U/L (ref 0–35)
AST: 15 U/L (ref 0–37)
Albumin: 4.8 g/dL (ref 3.5–5.2)
BUN: 10 mg/dL (ref 6–23)
CO2: 22 meq/L (ref 19–32)
Calcium: 9.9 mg/dL (ref 8.4–10.5)
Chloride: 108 mEq/L (ref 96–112)
Creat: 0.95 mg/dL (ref 0.50–1.10)
GLUCOSE: 88 mg/dL (ref 70–99)
POTASSIUM: 4.4 meq/L (ref 3.5–5.3)
Sodium: 138 mEq/L (ref 135–145)
TOTAL PROTEIN: 7.6 g/dL (ref 6.0–8.3)
Total Bilirubin: 0.3 mg/dL (ref 0.2–1.2)

## 2014-09-24 LAB — TSH: TSH: 1.034 u[IU]/mL (ref 0.350–4.500)

## 2014-09-24 NOTE — Progress Notes (Signed)
Patient ID: Regina Steele, female   DOB: 06-Feb-1981, 33 y.o.   MRN: [id number]   Subjective:    Patient ID: Regina Steele, female    DOB: 1981/08/05, 33 y.o.   MRN: [id number]  Patient presents for c/o loss of appetite patient here with complaint of urinary frequency and pressure over the past couple weeks her appetite is also been decreased during this same period of time. Her weight is down 6 pounds since her last visit a month ago. She denies any nausea vomiting associated denies any fever denies any blood in her stool denies blood in the urine. There've been no other new medication changes since her last visit. She states that she tries to use small meals but typically may only E once or twice a day. She does have history of eating disorder as well.    Review Of Systems:  GEN- denies fatigue, fever, weight loss,weakness, recent illness HEENT- denies eye drainage, change in vision, nasal discharge, CVS- denies chest pain, palpitations RESP- denies SOB, cough, wheeze ABD- denies N/V, change in stools, abd pain GU- denies dysuria, hematuria, dribbling, incontinence MSK- denies joint pain, muscle aches, injury Neuro- denies headache, dizziness, syncope, seizure activity       Objective:    BP 104/78 mmHg  Pulse 80  Temp(Src) 98.5 F (36.9 C) (Oral)  Resp 18  Wt 120 lb (54.432 kg)  LMP 12/14/2003 GEN- NAD, alert and oriented x3 HEENT- PERRL, EOMI, non injected sclera, pink conjunctiva, MMM, oropharynx clear Neck- Supple, no thyromegaly CVS- RRR, no murmur RESP-CTAB ABD-NABS,soft,suprapubic tenderness, no CVA tenderness Psych- flat affect, not anxious appearing, well groomed EXT- No edema Pulses- Radial 2+        Assessment & Plan:      Problem List Items Addressed This Visit    WEIGHT LOSS    Check some labs, prolactin as well, has had normal TSH She also has severe depression, history of eating disorder not sure if this is contributing as well Ensure BID     Relevant Orders      CBC with Differential (Completed)      Comprehensive metabolic panel (Completed)      TSH (Completed)    Other Visit Diagnoses    Dysuria    -  Primary    UA is negative, s/p hysterectomy no vaginal discharge or abd bleeding, discussed bowels, if neg labs and abd pain still prsent may need CT abd/pelvis    Relevant Orders       Urinalysis, Routine w reflex microscopic (Completed)    Elevated prolactin level        Relevant Orders       Prolactin (Completed)       Note: This dictation was prepared with Dragon dictation along with smaller phrase technology. Any transcriptional errors that result from this process are unintentional.
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
## 2014-09-24 NOTE — Telephone Encounter (Signed)
Pt states area is just constantly painful since seeing you.  Told her UA normal, waiting for lab results.  Take Ibuprofen or Tylenol for relief.  If remains painful can go to ED or UC

## 2014-09-24 NOTE — Patient Instructions (Signed)
We will call with results Drink ensure twice a day  Urine sample is normal F/u as needed

## 2014-09-24 NOTE — Telephone Encounter (Signed)
Please call and triage pt, there was nothing on her urine specimen, she needs to drink ensure for the weight She can also take some ibuprofen or tylenol for pain for now and await lab results

## 2014-09-25 LAB — PROLACTIN: PROLACTIN: 11.5 ng/mL

## 2014-09-25 NOTE — Assessment & Plan Note (Signed)
Check some labs, prolactin as well, has had normal TSH She also has severe depression, history of eating disorder not sure if this is contributing as well Ensure BID

## 2014-09-27 ENCOUNTER — Telehealth: Payer: Self-pay | Admitting: *Deleted

## 2014-09-27 MED ORDER — LORATADINE-PSEUDOEPHEDRINE ER 5-120 MG PO TB12: 1.0000 | ORAL_TABLET | Freq: Two times a day (BID) | ORAL | Status: DC

## 2014-09-27 MED ORDER — TRIAMCINOLONE ACETONIDE 55 MCG/ACT NA AERO: 2.0000 | INHALATION_SPRAY | Freq: Every day | NASAL | Status: DC

## 2014-09-27 MED ORDER — FEXOFENADINE HCL 180 MG PO TABS: 180.0000 mg | ORAL_TABLET | Freq: Every day | ORAL | Status: DC

## 2014-09-27 NOTE — Telephone Encounter (Signed)
Call placed to patient.   Reports that she has tried OTC Claritin D and found it to be ineffective as well.   MD please advise.

## 2014-09-27 NOTE — Addendum Note (Signed)
Addended by: Phillips Odor on: 09/27/2014 12:50 PM   Modules accepted: Orders, Medications

## 2014-09-27 NOTE — Telephone Encounter (Signed)
Call placed to patient and patient made aware.   Prescription sent to pharmacy.  

## 2014-09-27 NOTE — Telephone Encounter (Signed)
Received call from patient.   Reports that she discussed her allergy medication (xyzal) ineffectiveness with MD during last OV, but has not been advised to change anything at this time.   MD please advise.

## 2014-09-27 NOTE — Telephone Encounter (Signed)
Tell her she has tried all of the pills unless she has not been on Allegra If she has not taken allegra then send in allegra once a day Also send in Nasocort- have her d/c the flonase and try this one along with nasal saline rinses If these still dont help i can send her to allergist

## 2014-09-27 NOTE — Telephone Encounter (Signed)
Call pt I will see if we can get Claritin D covered, see if adding decongestant will help her symptoms better with the flonase  They may not cover if so she can buy over the counter to give it a try

## 2014-09-28 DIAGNOSIS — F331 Major depressive disorder, recurrent, moderate: Secondary | ICD-10-CM | POA: Diagnosis not present

## 2014-09-28 DIAGNOSIS — F411 Generalized anxiety disorder: Secondary | ICD-10-CM | POA: Diagnosis not present

## 2014-10-07 ENCOUNTER — Other Ambulatory Visit: Payer: Self-pay | Admitting: Neurology

## 2014-10-12 ENCOUNTER — Ambulatory Visit (INDEPENDENT_AMBULATORY_CARE_PROVIDER_SITE_OTHER): Payer: Medicare Other | Admitting: Family Medicine

## 2014-10-12 ENCOUNTER — Encounter: Payer: Self-pay | Admitting: Family Medicine

## 2014-10-12 VITALS — BP 110/68 | HR 60 | Temp 97.6°F | Resp 16 | Wt 121.0 lb

## 2014-10-12 DIAGNOSIS — F331 Major depressive disorder, recurrent, moderate: Secondary | ICD-10-CM | POA: Diagnosis not present

## 2014-10-12 DIAGNOSIS — F411 Generalized anxiety disorder: Secondary | ICD-10-CM | POA: Diagnosis not present

## 2014-10-12 DIAGNOSIS — J329 Chronic sinusitis, unspecified: Secondary | ICD-10-CM | POA: Diagnosis not present

## 2014-10-12 MED ORDER — AMOXICILLIN 875 MG PO TABS: 875.0000 mg | ORAL_TABLET | Freq: Two times a day (BID) | ORAL | Status: DC

## 2014-10-12 MED ORDER — MONTELUKAST SODIUM 10 MG PO TABS: 10.0000 mg | ORAL_TABLET | Freq: Every day | ORAL | Status: AC
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
## 2014-10-18 ENCOUNTER — Encounter: Payer: Self-pay | Admitting: Neurology

## 2014-10-18 ENCOUNTER — Ambulatory Visit (INDEPENDENT_AMBULATORY_CARE_PROVIDER_SITE_OTHER): Payer: Medicare Other | Admitting: Neurology

## 2014-10-18 VITALS — BP 125/76 | HR 78 | Temp 97.8°F | Ht 60.0 in | Wt 120.0 lb

## 2014-10-18 DIAGNOSIS — G4489 Other headache syndrome: Secondary | ICD-10-CM | POA: Diagnosis not present

## 2014-10-18 DIAGNOSIS — R51 Headache: Secondary | ICD-10-CM

## 2014-10-18 DIAGNOSIS — Z23 Encounter for immunization: Secondary | ICD-10-CM | POA: Diagnosis not present

## 2014-10-18 DIAGNOSIS — R519 Headache, unspecified: Secondary | ICD-10-CM

## 2014-10-18 MED ORDER — GABAPENTIN 100 MG PO CAPS: 300.0000 mg | ORAL_CAPSULE | Freq: Every day | ORAL | Status: DC

## 2014-10-18 MED ORDER — SUMATRIPTAN SUCCINATE 100 MG PO TABS: ORAL_TABLET | ORAL | Status: DC
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
## 2014-10-18 NOTE — Patient Instructions (Addendum)
Please remember, common headache triggers are: sleep deprivation, dehydration, overheating, stress, hypoglycemia or skipping meals and blood sugar fluctuations, excessive pain medications or excessive alcohol use or caffeine withdrawal. Some people have food triggers such as aged cheese, orange juice or chocolate, especially dark chocolate, or MSG (monosodium glutamate). Try to avoid these headache triggers as much possible. It may be helpful to keep a headache diary to figure out what makes your headaches worse or brings them on and what alleviates them. Some people report headache onset after exercise but studies have shown that regular exercise may actually prevent headaches from coming. If you have exercise-induced headaches, please make sure that you drink plenty of fluid before and after exercising and that you do not over do it and do not overheat.  We will continue with Imitrex as needed. Use no more than 2 pills per week.    We will continue with gabapentin 300 mg each night for headache prevention.   [name] has been prescribing your topamax for over a year, which was started for your mood. You will need to ask [name] to continue with the topamax as it is a mood stabilizer in your case.

## 2014-10-19 DIAGNOSIS — F331 Major depressive disorder, recurrent, moderate: Secondary | ICD-10-CM | POA: Diagnosis not present

## 2014-10-19 DIAGNOSIS — F411 Generalized anxiety disorder: Secondary | ICD-10-CM | POA: Diagnosis not present

## 2014-10-23 ENCOUNTER — Encounter (HOSPITAL_COMMUNITY): Payer: Self-pay | Admitting: Emergency Medicine

## 2014-10-23 ENCOUNTER — Emergency Department (HOSPITAL_COMMUNITY)
Admission: EM | Admit: 2014-10-23 | Discharge: 2014-10-23 | Disposition: A | Discharge status: Home / Self Care | Payer: Medicare Other | Attending: Emergency Medicine | Admitting: Emergency Medicine

## 2014-10-23 DIAGNOSIS — M810 Age-related osteoporosis without current pathological fracture: Secondary | ICD-10-CM | POA: Diagnosis not present

## 2014-10-23 DIAGNOSIS — Z7982 Long term (current) use of aspirin: Secondary | ICD-10-CM | POA: Diagnosis not present

## 2014-10-23 DIAGNOSIS — Z7951 Long term (current) use of inhaled steroids: Secondary | ICD-10-CM | POA: Diagnosis not present

## 2014-10-23 DIAGNOSIS — G43009 Migraine without aura, not intractable, without status migrainosus: Secondary | ICD-10-CM | POA: Diagnosis not present

## 2014-10-23 DIAGNOSIS — Z79899 Other long term (current) drug therapy: Secondary | ICD-10-CM | POA: Diagnosis not present

## 2014-10-23 DIAGNOSIS — F329 Major depressive disorder, single episode, unspecified: Secondary | ICD-10-CM | POA: Insufficient documentation

## 2014-10-23 DIAGNOSIS — F419 Anxiety disorder, unspecified: Secondary | ICD-10-CM | POA: Insufficient documentation

## 2014-10-23 DIAGNOSIS — M797 Fibromyalgia: Secondary | ICD-10-CM | POA: Diagnosis not present

## 2014-10-23 DIAGNOSIS — G43909 Migraine, unspecified, not intractable, without status migrainosus: Secondary | ICD-10-CM | POA: Diagnosis not present

## 2014-10-23 DIAGNOSIS — K5900 Constipation, unspecified: Secondary | ICD-10-CM | POA: Insufficient documentation

## 2014-10-23 DIAGNOSIS — R0982 Postnasal drip: Secondary | ICD-10-CM | POA: Insufficient documentation

## 2014-10-23 DIAGNOSIS — F4310 Post-traumatic stress disorder, unspecified: Secondary | ICD-10-CM | POA: Insufficient documentation

## 2014-10-23 DIAGNOSIS — Z792 Long term (current) use of antibiotics: Secondary | ICD-10-CM | POA: Insufficient documentation

## 2014-10-23 DIAGNOSIS — Z8742 Personal history of other diseases of the female genital tract: Secondary | ICD-10-CM | POA: Insufficient documentation

## 2014-10-23 MED ORDER — SODIUM CHLORIDE 0.9 % IV BOLUS (SEPSIS)
1000.0000 mL | Freq: Once | INTRAVENOUS | Status: AC
  Administered 2014-10-23: 1000 mL via INTRAVENOUS

## 2014-10-23 MED ORDER — KETOROLAC TROMETHAMINE 30 MG/ML IJ SOLN
30.0000 mg | Freq: Once | INTRAMUSCULAR | Status: AC
  Administered 2014-10-23: 30 mg via INTRAVENOUS
  Filled 2014-10-23: qty 1

## 2014-10-23 MED ORDER — PROCHLORPERAZINE EDISYLATE 5 MG/ML IJ SOLN
10.0000 mg | Freq: Once | INTRAMUSCULAR | Status: AC
  Administered 2014-10-23: 10 mg via INTRAVENOUS
  Filled 2014-10-23: qty 2

## 2014-10-23 MED ORDER — DIPHENHYDRAMINE HCL 50 MG/ML IJ SOLN
25.0000 mg | Freq: Once | INTRAMUSCULAR | Status: AC
  Administered 2014-10-23: 25 mg via INTRAVENOUS
  Filled 2014-10-23: qty 1

## 2014-10-23 NOTE — Discharge Instructions (Signed)

## 2014-10-23 NOTE — ED Provider Notes (Signed)
CSN: [id number]     Arrival date & time 10/23/14  1623 History   First MD Initiated Contact with Patient 10/23/14 1702     Chief Complaint  Patient presents with  . Migraine   Regina Steele is a 33 y.o. female with history of chronic migraines, anxiety and fibromyalgia who presents the ED complaining of a migraine for the past 3 days. Patient reports her migraine started 3 days ago and had some improvement returned worse this morning. She rates her pain at an 8 out of 10. The patient's headache is left-sided and throbbing. Patient's pain is better lying down and worse sitting up. Patient also reports phonophobia. Patient reports her migraine is similar to her previous migraines. Patient reports she's taken Tylenol and Imitrex today with little relief. Patient reports a decreased appetite but is drinking fluids well. The patient but she saw her neurologist on 10/18/2014 and had a normal follow-up visit. The patient had a normal brain MRI on 05/2014. Patient denies fevers, numbness, tingling, weakness, changes to her vision, dizziness, lightheadedness, abdominal pain, vomiting, or dysuria.  (Consider location/radiation/quality/duration/timing/severity/associated sxs/prior Treatment) HPI  Past Medical History  Diagnosis Date  . PTSD (post-traumatic stress disorder)   . Uterine prolapse 2013  . Depression   . Hiatal hernia   . Surgical menopause   . Chronic headache   . Constipation   . Esophageal reflux     no meds  . Osteopenia   . Eating disorder   . Anxiety   . Osteoporosis   . Fibromyalgia    Past Surgical History  Procedure Laterality Date  . Bilateral oophorectomy      bilat  . Rectocele repair    . Cesarean section    . Abdominal hysterectomy     Family History  Problem Relation Age of Onset  . Breast cancer Maternal Grandmother 25  . Breast cancer Paternal Aunt 40  . Celiac disease Paternal Grandmother   . Heart disease Father   . Colon cancer Neg Hx   . Mental  illness Mother   . Bipolar disorder Maternal Grandmother   . Mental illness Brother   . Other Mother     cyst in right breast   History  Substance Use Topics  . Smoking status: Never Smoker   . Smokeless tobacco: Never Used  . Alcohol Use: No   OB History    Gravida Para Term Preterm AB TAB SAB Ectopic Multiple Living   3 3 3       3      Review of Systems  Constitutional: Positive for chills. Negative for fever and fatigue.  HENT: Positive for postnasal drip. Negative for congestion, ear discharge, ear pain, sore throat, tinnitus and trouble swallowing.   Eyes: Negative for pain and visual disturbance.  Respiratory: Negative for cough, shortness of breath and wheezing.   Cardiovascular: Negative for chest pain and palpitations.  Gastrointestinal: Positive for nausea. Negative for vomiting, abdominal pain and diarrhea.  Genitourinary: Negative for dysuria, frequency, hematuria, flank pain, decreased urine volume and difficulty urinating.  Musculoskeletal: Negative for back pain, neck pain and neck stiffness.  Skin: Negative for rash and wound.  Neurological: Positive for headaches. Negative for dizziness, syncope, facial asymmetry, weakness, light-headedness and numbness.  All other systems reviewed and are negative.     Allergies  Gluten meal  Home Medications   Prior to Admission medications   Medication Sig Start Date End Date Taking? Authorizing Provider  acetaminophen (TYLENOL) 325 MG tablet Take  650 mg by mouth every 6 (six) hours as needed for moderate pain (pain).   Yes Historical Provider, MD  amoxicillin (AMOXIL) 875 MG tablet Take 1 tablet (875 mg total) by mouth 2 (two) times daily. 10/12/14  Yes Donita Brooks, MD  aspirin 325 MG tablet Take 650 mg by mouth every 4 (four) hours as needed for mild pain (pain).    Yes Historical Provider, MD  buPROPion (WELLBUTRIN XL) 300 MG 24 hr tablet Take 1 tablet (300 mg total) by mouth daily with breakfast. 08/24/14  Yes  Cleotis Nipper, MD  escitalopram (LEXAPRO) 20 MG tablet Take 1 tablet (20 mg total) by mouth daily. 08/24/14  Yes Cleotis Nipper, MD  estradiol (ESTRACE) 2 MG tablet Take 2 mg by mouth daily. For hormone replacement (Low estrogen) 07/13/13  Yes Sanjuana Kava, NP  gabapentin (NEURONTIN) 100 MG capsule Take 3 capsules (300 mg total) by mouth at bedtime. Patient taking differently: Take 300 mg by mouth every morning.  10/18/14  Yes Huston Foley, MD  ibandronate (BONIVA) 150 MG tablet Take 150 mg by mouth every 30 (thirty) days. Take in the morning with a full glass of water, on an empty stomach, and do not take anything else by mouth or lie down for the next 30 min.   Yes Historical Provider, MD  Linaclotide Karlene Einstein) 145 MCG CAPS capsule Take 1 capsule (145 mcg total) by mouth daily. 05/25/14  Yes Hilarie Fredrickson, MD  LORazepam (ATIVAN) 1 MG tablet Take 1 tablet (1 mg total) by mouth daily as needed for anxiety. 07/07/14  Yes Cleotis Nipper, MD  mirtazapine (REMERON) 15 MG tablet Take 1 tablet (15 mg total) by mouth at bedtime. 08/24/14 08/24/15 Yes Cleotis Nipper, MD  montelukast (SINGULAIR) 10 MG tablet Take 1 tablet (10 mg total) by mouth at bedtime. Patient taking differently: Take 10 mg by mouth every morning.  10/12/14  Yes Donita Brooks, MD  ondansetron (ZOFRAN) 4 MG tablet Take 1 tablet (4 mg total) by mouth every 6 (six) hours. 07/10/14  Yes Mora Bellman, PA-C  PREMARIN vaginal cream Place 1 Applicatorful vaginally at bedtime.  08/30/14  Yes Historical Provider, MD  SUMAtriptan (IMITREX) 100 MG tablet Take one pill as needed for migraine attack. Do not take more than 2 times per week. 10/18/14  Yes Huston Foley, MD  topiramate (TOPAMAX) 100 MG tablet Take 100-150 mg by mouth 2 (two) times daily. Take 100 mg in am & Take 150 mg in pm.   Yes Historical Provider, MD  fexofenadine (ALLEGRA ALLERGY) 180 MG tablet Take 1 tablet (180 mg total) by mouth daily. Patient not taking: Reported on 10/23/2014 09/27/14    Salley Scarlet, MD  gabapentin (NEURONTIN) 100 MG capsule TAKE ONE CAPSULE AT BEDTIME FOR 1 WEEK THEN 2 CAPS AT BED X1 WEEK 3 CAPSULES AT BEDTIME THEREAFTER Patient not taking: Reported on 10/23/2014 10/10/14   Huston Foley, MD  topiramate (TOPAMAX) 100 MG tablet Take 1 tab in am and 1.5 tab at bed time Patient not taking: Reported on 10/23/2014 08/24/14   Cleotis Nipper, MD  triamcinolone (NASACORT ALLERGY 24HR) 55 MCG/ACT AERO nasal inhaler Place 2 sprays into the nose daily. Patient not taking: Reported on 10/23/2014 09/27/14   Salley Scarlet, MD   BP 121/81 mmHg  Pulse 85  Temp(Src) 97.9 F (36.6 C) (Oral)  Resp 18  SpO2 100%  LMP 12/14/2003 Physical Exam  Constitutional: She is oriented to person,  place, and time. She appears well-developed and well-nourished. No distress.  HENT:  Head: Normocephalic and atraumatic.  Right Ear: External ear normal.  Left Ear: External ear normal.  Nose: Nose normal.  Mouth/Throat: Oropharynx is clear and moist. No oropharyngeal exudate.  Bilateral tympanic membranes are pearly-gray without erythema or loss of landmarks. No temporal pain or bulging.  Eyes: Conjunctivae and EOM are normal. Pupils are equal, round, and reactive to light. Right eye exhibits no discharge. Left eye exhibits no discharge.  EOMs are intact bilaterally without nystagmus.  Neck: Normal range of motion. Neck supple.  Cardiovascular: Normal rate, regular rhythm, normal heart sounds and intact distal pulses.  Exam reveals no gallop and no friction rub.   No murmur heard. Pulmonary/Chest: Effort normal and breath sounds normal. No respiratory distress. She has no wheezes. She has no rales.  Abdominal: Soft. Bowel sounds are normal. She exhibits no distension and no mass. There is no tenderness. There is no rebound and no guarding.  Musculoskeletal: Normal range of motion. She exhibits no edema.  The patient's strength is 5 out of 5 in her bilateral upper and lower  extremities. Patient is able to ambulate in the room without difficulty or assistance.  Lymphadenopathy:    She has no cervical adenopathy.  Neurological: She is alert and oriented to person, place, and time. No cranial nerve deficit. Coordination normal.  Cranial nerves II through XII are intact bilaterally. Patient sensation is intact bilaterally. Good grip strength bilaterally.  Skin: Skin is warm and dry. No rash noted. She is not diaphoretic. No erythema. No pallor.  Psychiatric: She has a normal mood and affect. Her behavior is normal.  Nursing note and vitals reviewed.   ED Course  Procedures (including critical care time) Labs Review Labs Reviewed - No data to display  Imaging Review No results found.   EKG Interpretation None      Filed Vitals:   10/23/14 1641  BP: 121/81  Pulse: 85  Temp: 97.9 F (36.6 C)  TempSrc: Oral  Resp: 18  SpO2: 100%      MDM   Meds given in ED:  Medications  sodium chloride 0.9 % bolus 1,000 mL (1,000 mLs Intravenous New Bag/Given 10/23/14 1744)  ketorolac (TORADOL) 30 MG/ML injection 30 mg (30 mg Intravenous Given 10/23/14 1744)  diphenhydrAMINE (BENADRYL) injection 25 mg (25 mg Intravenous Given 10/23/14 1744)  prochlorperazine (COMPAZINE) injection 10 mg (10 mg Intravenous Given 10/23/14 1755)    New Prescriptions   No medications on file    Final diagnoses:  Migraine without aura and without status migrainosus, not intractable   Regina Steele is a 33 y.o. female with history of chronic migraines, anxiety and fibromyalgia who presents the ED complaining of a migraine for the past 3 days. Patient reports this migraine is similar to her previous. Patient is afebrile and nontoxic appearing. Patient has no neurological deficits. Cranial nerves II through XII are intact bilaterally. Patient was seen by her neurologist last week and had a normal brain MRI on 7/15.  18:50 patient reports that her migraine has completely  resolved and she is ready to be discharged. The patient has no complaints at this time.  We'll discharge this patient. Education provided on migraine prevention. Advised patient to follow-up with her primary care provider next week. Advised patient to return to the emergency department with new or worsening symptoms or new concerns. The patient verbalized understanding and agreement with plan.  This patient was discussed with Dr.  Freida Busman who agrees with assessment and plan.      Lawana Chambers, PA-C 10/23/14 1905  Toy Baker, MD 10/23/14 [phone]

## 2014-10-23 NOTE — ED Notes (Signed)
Pt from home c/o a migraine with nausea since Thursday. She has been taking Imitrex  and tylenol without relief. Pt reports she is not out of her Imitrex. Chills present and decreased appetite.

## 2014-10-25 ENCOUNTER — Ambulatory Visit (INDEPENDENT_AMBULATORY_CARE_PROVIDER_SITE_OTHER): Payer: Medicare Other | Admitting: Psychiatry

## 2014-10-25 ENCOUNTER — Encounter (HOSPITAL_COMMUNITY): Payer: Self-pay | Admitting: Psychiatry

## 2014-10-25 VITALS — BP 130/86 | HR 71 | Ht 62.0 in | Wt 119.4 lb

## 2014-10-25 DIAGNOSIS — F4310 Post-traumatic stress disorder, unspecified: Secondary | ICD-10-CM

## 2014-10-25 DIAGNOSIS — F319 Bipolar disorder, unspecified: Secondary | ICD-10-CM | POA: Diagnosis not present

## 2014-10-25 MED ORDER — BUPROPION HCL ER (XL) 300 MG PO TB24: 300.0000 mg | ORAL_TABLET | Freq: Every day | ORAL | Status: DC

## 2014-10-25 MED ORDER — ESCITALOPRAM OXALATE 20 MG PO TABS: 20.0000 mg | ORAL_TABLET | Freq: Every day | ORAL | Status: DC

## 2014-10-25 MED ORDER — MIRTAZAPINE 15 MG PO TABS: 15.0000 mg | ORAL_TABLET | Freq: Every day | ORAL | Status: DC

## 2014-10-25 MED ORDER — TOPIRAMATE 100 MG PO TABS: 100.0000 mg | ORAL_TABLET | Freq: Two times a day (BID) | ORAL | Status: AC
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
## 2014-10-26 DIAGNOSIS — M797 Fibromyalgia: Secondary | ICD-10-CM | POA: Diagnosis not present

## 2014-10-26 DIAGNOSIS — E559 Vitamin D deficiency, unspecified: Secondary | ICD-10-CM | POA: Diagnosis not present

## 2014-10-26 DIAGNOSIS — M064 Inflammatory polyarthropathy: Secondary | ICD-10-CM | POA: Diagnosis not present

## 2014-10-26 DIAGNOSIS — R760 Raised antibody titer: Secondary | ICD-10-CM | POA: Diagnosis not present

## 2014-10-28 DIAGNOSIS — F411 Generalized anxiety disorder: Secondary | ICD-10-CM | POA: Diagnosis not present

## 2014-10-28 DIAGNOSIS — F331 Major depressive disorder, recurrent, moderate: Secondary | ICD-10-CM | POA: Diagnosis not present

## 2014-10-29 ENCOUNTER — Emergency Department (HOSPITAL_COMMUNITY): Payer: Medicare Other

## 2014-10-29 ENCOUNTER — Emergency Department (HOSPITAL_COMMUNITY)
Admission: EM | Admit: 2014-10-29 | Discharge: 2014-10-29 | Disposition: A | Discharge status: Home / Self Care | Payer: Medicare Other | Attending: Emergency Medicine | Admitting: Emergency Medicine

## 2014-10-29 ENCOUNTER — Encounter (HOSPITAL_COMMUNITY): Payer: Self-pay | Admitting: Emergency Medicine

## 2014-10-29 DIAGNOSIS — Z79899 Other long term (current) drug therapy: Secondary | ICD-10-CM | POA: Diagnosis not present

## 2014-10-29 DIAGNOSIS — R079 Chest pain, unspecified: Secondary | ICD-10-CM | POA: Insufficient documentation

## 2014-10-29 DIAGNOSIS — R072 Precordial pain: Secondary | ICD-10-CM | POA: Diagnosis not present

## 2014-10-29 DIAGNOSIS — Z7982 Long term (current) use of aspirin: Secondary | ICD-10-CM | POA: Diagnosis not present

## 2014-10-29 DIAGNOSIS — Z87448 Personal history of other diseases of urinary system: Secondary | ICD-10-CM | POA: Insufficient documentation

## 2014-10-29 DIAGNOSIS — M797 Fibromyalgia: Secondary | ICD-10-CM | POA: Insufficient documentation

## 2014-10-29 DIAGNOSIS — K5900 Constipation, unspecified: Secondary | ICD-10-CM | POA: Diagnosis not present

## 2014-10-29 DIAGNOSIS — F329 Major depressive disorder, single episode, unspecified: Secondary | ICD-10-CM | POA: Insufficient documentation

## 2014-10-29 DIAGNOSIS — Z3202 Encounter for pregnancy test, result negative: Secondary | ICD-10-CM | POA: Insufficient documentation

## 2014-10-29 DIAGNOSIS — Z8742 Personal history of other diseases of the female genital tract: Secondary | ICD-10-CM | POA: Diagnosis not present

## 2014-10-29 DIAGNOSIS — M810 Age-related osteoporosis without current pathological fracture: Secondary | ICD-10-CM | POA: Diagnosis not present

## 2014-10-29 DIAGNOSIS — F419 Anxiety disorder, unspecified: Secondary | ICD-10-CM | POA: Insufficient documentation

## 2014-10-29 LAB — CBC
HEMATOCRIT: 36.2 % (ref 36.0–46.0)
HEMOGLOBIN: 13 g/dL (ref 12.0–15.0)
MCH: 29.7 pg (ref 26.0–34.0)
MCHC: 35.9 g/dL (ref 30.0–36.0)
MCV: 82.6 fL (ref 78.0–100.0)
Platelets: 273 10*3/uL (ref 150–400)
RBC: 4.38 MIL/uL (ref 3.87–5.11)
RDW: 11.5 % (ref 11.5–15.5)
WBC: 7.4 10*3/uL (ref 4.0–10.5)

## 2014-10-29 LAB — BASIC METABOLIC PANEL
Anion gap: 14 (ref 5–15)
BUN: 10 mg/dL (ref 6–23)
CO2: 20 meq/L (ref 19–32)
CREATININE: 0.68 mg/dL (ref 0.50–1.10)
Calcium: 9.2 mg/dL (ref 8.4–10.5)
Chloride: 87 mEq/L — ABNORMAL LOW (ref 96–112)
GFR calc Af Amer: >90 mL/min (ref >90)
GFR calc non Af Amer: >90 mL/min (ref >90)
GLUCOSE: 86 mg/dL (ref 70–99)
Potassium: 3.7 mEq/L (ref 3.7–5.3)
Sodium: 121 mEq/L — CL (ref 137–147)

## 2014-10-29 LAB — I-STAT TROPONIN, ED: TROPONIN I, POC: 0 ng/mL (ref 0.00–0.08)

## 2014-10-29 LAB — POC URINE PREG, ED: Preg Test, Ur: NEGATIVE

## 2014-10-29 LAB — TROPONIN I

## 2014-10-29 MED ORDER — SODIUM CHLORIDE 0.9 % IV BOLUS (SEPSIS)
1000.0000 mL | Freq: Once | INTRAVENOUS | Status: AC
  Administered 2014-10-29: 1000 mL via INTRAVENOUS

## 2014-10-29 NOTE — ED Notes (Signed)
Per EMS: Pt was carrying heavy objects at home and had sudden onset of CP and SOB.  Currently has no CP or SOB, but sts tingling in left arm (3/10).  Pt took 325 ASA before EMS arrival.  Pt has hx of anxiety, fibromyalgia and "possible lupus".

## 2014-10-29 NOTE — ED Notes (Signed)
Pt remains monitored by blood pressure, pulse ox, and 12 lead.  

## 2014-10-29 NOTE — ED Provider Notes (Signed)
CSN: [id number]     Arrival date & time 10/29/14  1815 History   First MD Initiated Contact with Patient 10/29/14 1828     Chief Complaint  Patient presents with  . Chest Pain     (Consider location/radiation/quality/duration/timing/severity/associated sxs/prior Treatment) Patient is a 33 y.o. female presenting with chest pain. The history is provided by the patient.  Chest Pain Pain location:  Substernal area Pain quality: dull and pressure   Pain radiates to:  Does not radiate Pain radiates to the back: no   Pain severity:  Moderate Onset quality:  Sudden Duration:  5 hours Timing:  Constant Progression:  Waxing and waning Chronicity:  New Context: lifting (lifting heavy toys)   Relieved by:  Nothing Worsened by:  Deep breathing (palpation of chest) Ineffective treatments:  Aspirin Associated symptoms: no abdominal pain, no back pain, no cough, no diaphoresis, no dizziness, no dysphagia, no fatigue, no fever, no headache, no nausea, no palpitations, no shortness of breath, not vomiting and no weakness   Risk factors: no coronary artery disease     Past Medical History  Diagnosis Date  . PTSD (post-traumatic stress disorder)   . Uterine prolapse 2013  . Depression   . Hiatal hernia   . Surgical menopause   . Chronic headache   . Constipation   . Esophageal reflux     no meds  . Osteopenia   . Eating disorder   . Anxiety   . Osteoporosis   . Fibromyalgia    Past Surgical History  Procedure Laterality Date  . Bilateral oophorectomy      bilat  . Rectocele repair    . Cesarean section    . Abdominal hysterectomy     Family History  Problem Relation Age of Onset  . Breast cancer Maternal Grandmother 44  . Breast cancer Paternal Aunt 40  . Celiac disease Paternal Grandmother   . Heart disease Father   . Colon cancer Neg Hx   . Mental illness Mother   . Bipolar disorder Maternal Grandmother   . Mental illness Brother   . Other Mother     cyst in right  breast   History  Substance Use Topics  . Smoking status: Never Smoker   . Smokeless tobacco: Never Used  . Alcohol Use: No   OB History    Gravida Para Term Preterm AB TAB SAB Ectopic Multiple Living   3 3 3       3      Review of Systems  Constitutional: Negative for fever, chills, diaphoresis, activity change, appetite change and fatigue.  HENT: Negative for facial swelling, rhinorrhea, sore throat, trouble swallowing and voice change.   Eyes: Negative for photophobia, pain and visual disturbance.  Respiratory: Negative for cough, shortness of breath, wheezing and stridor.   Cardiovascular: Positive for chest pain. Negative for palpitations and leg swelling.  Gastrointestinal: Negative for nausea, vomiting, abdominal pain, constipation and anal bleeding.  Endocrine: Negative.   Genitourinary: Negative for dysuria, vaginal bleeding, vaginal discharge and vaginal pain.  Musculoskeletal: Negative for myalgias, back pain and arthralgias.  Skin: Negative.  Negative for rash.  Allergic/Immunologic: Negative.   Neurological: Negative for dizziness, tremors, syncope, weakness and headaches.  Psychiatric/Behavioral: Negative for suicidal ideas, sleep disturbance and self-injury.  All other systems reviewed and are negative.     Allergies  Gluten meal  Home Medications   Prior to Admission medications   Medication Sig Start Date End Date Taking? Authorizing Provider  acetaminophen (TYLENOL)  325 MG tablet Take 650 mg by mouth every 6 (six) hours as needed for moderate pain (pain).   Yes Historical Provider, MD  aspirin 325 MG tablet Take 650 mg by mouth every 4 (four) hours as needed for mild pain (pain).    Yes Historical Provider, MD  buPROPion (WELLBUTRIN XL) 300 MG 24 hr tablet Take 1 tablet (300 mg total) by mouth daily with breakfast. 10/25/14  Yes Cleotis Nipper, MD  escitalopram (LEXAPRO) 20 MG tablet Take 1 tablet (20 mg total) by mouth daily. 10/25/14  Yes Cleotis Nipper, MD   estradiol (ESTRACE) 2 MG tablet Take 2 mg by mouth daily. For hormone replacement (Low estrogen) 07/13/13  Yes Sanjuana Kava, NP  fexofenadine (ALLEGRA ALLERGY) 180 MG tablet Take 1 tablet (180 mg total) by mouth daily. 09/27/14  Yes Salley Scarlet, MD  gabapentin (NEURONTIN) 100 MG capsule Take 3 capsules (300 mg total) by mouth at bedtime. Patient taking differently: Take 300 mg by mouth every morning.  10/18/14  Yes Huston Foley, MD  ibandronate (BONIVA) 150 MG tablet Take 150 mg by mouth every 30 (thirty) days. Take in the morning with a full glass of water, on an empty stomach, and do not take anything else by mouth or lie down for the next 30 min.   Yes Historical Provider, MD  Linaclotide Karlene Einstein) 145 MCG CAPS capsule Take 1 capsule (145 mcg total) by mouth daily. 05/25/14  Yes Hilarie Fredrickson, MD  LORazepam (ATIVAN) 1 MG tablet Take 1 tablet (1 mg total) by mouth daily as needed for anxiety. 07/07/14  Yes Cleotis Nipper, MD  mirtazapine (REMERON) 15 MG tablet Take 1 tablet (15 mg total) by mouth at bedtime. 10/25/14 10/25/15 Yes Cleotis Nipper, MD  montelukast (SINGULAIR) 10 MG tablet Take 1 tablet (10 mg total) by mouth at bedtime. Patient taking differently: Take 10 mg by mouth every morning.  10/12/14  Yes Donita Brooks, MD  ondansetron (ZOFRAN) 4 MG tablet Take 1 tablet (4 mg total) by mouth every 6 (six) hours. 07/10/14  Yes Mora Bellman, PA-C  PREMARIN vaginal cream Place 1 Applicatorful vaginally at bedtime.  08/30/14  Yes Historical Provider, MD  SUMAtriptan (IMITREX) 100 MG tablet Take one pill as needed for migraine attack. Do not take more than 2 times per week. 10/18/14  Yes Huston Foley, MD  topiramate (TOPAMAX) 100 MG tablet Take 1-1.5 tablets (100-150 mg total) by mouth 2 (two) times daily. Take 100 mg in am & Take 150 mg in pm. 10/25/14  Yes Cleotis Nipper, MD  triamcinolone (NASACORT ALLERGY 24HR) 55 MCG/ACT AERO nasal inhaler Place 2 sprays into the nose daily. 09/27/14  Yes  Salley Scarlet, MD   BP 127/82 mmHg  Pulse 86  Temp(Src) 98.1 F (36.7 C) (Oral)  Resp 23  Ht 5\' 2"  (1.575 m)  Wt 120 lb (54.432 kg)  BMI 21.94 kg/m2  SpO2 100%  LMP 12/14/2003 Physical Exam  Constitutional: She is oriented to person, place, and time. She appears well-developed and well-nourished. No distress.  HENT:  Head: Normocephalic and atraumatic.  Right Ear: External ear normal.  Left Ear: External ear normal.  Mouth/Throat: Oropharynx is clear and moist. No oropharyngeal exudate.  Eyes: Conjunctivae and EOM are normal. Pupils are equal, round, and reactive to light. No scleral icterus.  Neck: Normal range of motion. Neck supple. No JVD present. No tracheal deviation present. No thyromegaly present.  Cardiovascular: Normal rate, regular rhythm  and intact distal pulses.  Exam reveals no gallop and no friction rub.   No murmur heard. Pulmonary/Chest: Effort normal and breath sounds normal. No respiratory distress. She has no wheezes. She has no rales. She exhibits tenderness (chest pain completely reproducible to palpation).  Abdominal: Soft. Bowel sounds are normal. She exhibits no distension. There is no tenderness.  Musculoskeletal: Normal range of motion. She exhibits no edema or tenderness.  Neurological: She is alert and oriented to person, place, and time. No cranial nerve deficit. She exhibits normal muscle tone. Coordination normal.  5/5 strength in all 4 extremities. Normal Gait.   Skin: Skin is warm and dry. She is not diaphoretic. No pallor.  Psychiatric: She has a normal mood and affect. She expresses no homicidal and no suicidal ideation. She expresses no suicidal plans and no homicidal plans.  Nursing note and vitals reviewed.   ED Course  Procedures (including critical care time) Labs Review Labs Reviewed  BASIC METABOLIC PANEL - Abnormal; Notable for the following:    Sodium 121 (*)    Chloride 87 (*)    All other components within normal limits  CBC   TROPONIN I  I-STAT TROPOININ, ED  POC URINE PREG, ED    Imaging Review Dg Chest 2 View  10/29/2014   CLINICAL DATA:  Chest pain, tightness, pressure, initial encounter  EXAM: CHEST  2 VIEW  COMPARISON:  04/30/2013  FINDINGS: The heart size and mediastinal contours are within normal limits. Both lungs are clear. The visualized skeletal structures are unremarkable.  IMPRESSION: No active cardiopulmonary disease.   Electronically Signed   By: Elige Ko   On: 10/29/2014 19:58     EKG Interpretation   Date/Time:  Friday October 29 2014 18:26:26 EST Ventricular Rate:  75 PR Interval:  168 QRS Duration: 93 QT Interval:  396 QTC Calculation: 442 R Axis:   76 Text Interpretation:  Sinus rhythm No significant change since last  tracing Confirmed by [facility]  MD, MARTHA [phone]) on 10/29/2014 7:54:42 PM      MDM   Final diagnoses:  Chest pain    The patient is a 33 year old female who presents for 5 hours of chest pain that started while lifting christmas presents and is completely reproducible to palpation. Patient is AFVSS. Exam as above. EKG shows NSR with no ischemic changes and delta troponin undetectable, do not susepct ACS or other acute cardiac pathology. Patient PERC negative with no pleuritic comonent to her pain, do not suspect PE and will not pursue further testing.  I estimate there is LOW risk for PERICARDIAL TAMPONADE, PNEUMOTHORAX, PULMONARY EMBOLISM, ACUTE CORONARY SYNDROME, OR THORACIC AORTIC DISSECTION, thus I consider the discharge disposition reasonable. We have discussed the diagnosis and risks, and we agree with discharging home to follow-up with their primary doctor. We also discussed returning to the Emergency Department immediately if new or worsening symptoms occur. We have discussed the symptoms which are most concerning (e.g., bloody sputum, fever, worsening pain or shortness of breath, vomiting) that necessitate immediate return.  Patient seen with attending,  Dr. Karma Ganja, who oversaw clinical decision making.     Lula Olszewski, MD 10/29/14 0932  Ethelda Chick, MD 10/29/14 [phone]

## 2014-10-29 NOTE — Discharge Instructions (Signed)

## 2014-11-02 DIAGNOSIS — F331 Major depressive disorder, recurrent, moderate: Secondary | ICD-10-CM | POA: Diagnosis not present

## 2014-11-02 DIAGNOSIS — F909 Attention-deficit hyperactivity disorder, unspecified type: Secondary | ICD-10-CM | POA: Diagnosis not present

## 2014-11-02 DIAGNOSIS — F411 Generalized anxiety disorder: Secondary | ICD-10-CM | POA: Diagnosis not present

## 2014-11-13 ENCOUNTER — Emergency Department (HOSPITAL_COMMUNITY)
Admission: EM | Admit: 2014-11-13 | Discharge: 2014-11-13 | Disposition: A | Discharge status: Home / Self Care | Payer: Medicare Other | Attending: Emergency Medicine | Admitting: Emergency Medicine

## 2014-11-13 ENCOUNTER — Encounter (HOSPITAL_COMMUNITY): Payer: Self-pay | Admitting: Emergency Medicine

## 2014-11-13 DIAGNOSIS — Z791 Long term (current) use of non-steroidal anti-inflammatories (NSAID): Secondary | ICD-10-CM | POA: Insufficient documentation

## 2014-11-13 DIAGNOSIS — Z79899 Other long term (current) drug therapy: Secondary | ICD-10-CM | POA: Insufficient documentation

## 2014-11-13 DIAGNOSIS — F329 Major depressive disorder, single episode, unspecified: Secondary | ICD-10-CM | POA: Insufficient documentation

## 2014-11-13 DIAGNOSIS — Z8719 Personal history of other diseases of the digestive system: Secondary | ICD-10-CM | POA: Diagnosis not present

## 2014-11-13 DIAGNOSIS — Z793 Long term (current) use of hormonal contraceptives: Secondary | ICD-10-CM | POA: Insufficient documentation

## 2014-11-13 DIAGNOSIS — F419 Anxiety disorder, unspecified: Secondary | ICD-10-CM | POA: Insufficient documentation

## 2014-11-13 DIAGNOSIS — G43009 Migraine without aura, not intractable, without status migrainosus: Secondary | ICD-10-CM | POA: Diagnosis not present

## 2014-11-13 DIAGNOSIS — Z7982 Long term (current) use of aspirin: Secondary | ICD-10-CM | POA: Insufficient documentation

## 2014-11-13 DIAGNOSIS — F4312 Post-traumatic stress disorder, chronic: Secondary | ICD-10-CM | POA: Insufficient documentation

## 2014-11-13 DIAGNOSIS — G43909 Migraine, unspecified, not intractable, without status migrainosus: Secondary | ICD-10-CM | POA: Diagnosis not present

## 2014-11-13 DIAGNOSIS — Z8742 Personal history of other diseases of the female genital tract: Secondary | ICD-10-CM | POA: Diagnosis not present

## 2014-11-13 DIAGNOSIS — M797 Fibromyalgia: Secondary | ICD-10-CM | POA: Insufficient documentation

## 2014-11-13 DIAGNOSIS — R51 Headache: Secondary | ICD-10-CM | POA: Diagnosis present

## 2014-11-13 MED ORDER — SODIUM CHLORIDE 0.9 % IV BOLUS (SEPSIS)
1000.0000 mL | Freq: Once | INTRAVENOUS | Status: AC
  Administered 2014-11-13: 1000 mL via INTRAVENOUS

## 2014-11-13 MED ORDER — PROCHLORPERAZINE EDISYLATE 5 MG/ML IJ SOLN
10.0000 mg | Freq: Once | INTRAMUSCULAR | Status: AC
  Administered 2014-11-13: 10 mg via INTRAVENOUS
  Filled 2014-11-13: qty 2

## 2014-11-13 MED ORDER — DIPHENHYDRAMINE HCL 50 MG/ML IJ SOLN
25.0000 mg | Freq: Once | INTRAMUSCULAR | Status: AC
  Administered 2014-11-13: 25 mg via INTRAVENOUS
  Filled 2014-11-13: qty 1

## 2014-11-13 MED ORDER — KETOROLAC TROMETHAMINE 30 MG/ML IJ SOLN
30.0000 mg | Freq: Once | INTRAMUSCULAR | Status: AC
  Administered 2014-11-13: 30 mg via INTRAVENOUS
  Filled 2014-11-13: qty 1

## 2014-11-13 NOTE — ED Notes (Addendum)
Patient c/o migraines with nausea. No emesis noted. Seen by neurologist [facility]). Has Imitrex and took on Thursday and Monday with no alleviation. Needs Estradione pills (hormone therapy) but hasn't seen OB-GYN in over a year and says she thinks this is contributing. Had both ovaries removed in 2005 and says she never had migraines before this. Has not taken any pain medications today. Says compazine works better than Reglan for her. Denies blurry vision but does have light sensitivity which she says is normal with her migraines. No other questions/concerns.

## 2014-11-13 NOTE — ED Notes (Signed)
IV removed, cath in tact

## 2014-11-13 NOTE — ED Provider Notes (Signed)
CSN: [id number]     Arrival date & time 11/13/14  1149 History   First MD Initiated Contact with Patient 11/13/14 1308     Chief Complaint  Patient presents with  . Migraine  . Nausea     (Consider location/radiation/quality/duration/timing/severity/associated sxs/prior Treatment) HPI Patient presents to the emergency department with migraine headache.  This been ongoing since Thursday.  The patient states that she had a migraine headache Monday and took Imitrex with relief of her symptoms.  She then took it again on Thursday without relief.  He states that her neurologist advised her not to take more than twice a week.  The patient states that she came in to the emergency department because the headache has persisted.  She states that Compazine normally helps with her headache.  Patient states light and sound make the headache worse.  The patient denies chest pain, shortness of breath, cough, rhinorrhea, sore throat, fever, back pain, neck pain, rash, lightheadedness, dizziness, weakness, numbness, vomiting, diarrhea or syncope.  Patient states that she has had some nausea. Past Medical History  Diagnosis Date  . PTSD (post-traumatic stress disorder)   . Uterine prolapse 2013  . Depression   . Hiatal hernia   . Surgical menopause   . Chronic headache   . Constipation   . Esophageal reflux     no meds  . Osteopenia   . Eating disorder   . Anxiety   . Osteoporosis   . Fibromyalgia    Past Surgical History  Procedure Laterality Date  . Bilateral oophorectomy      bilat  . Rectocele repair    . Cesarean section    . Abdominal hysterectomy     Family History  Problem Relation Age of Onset  . Breast cancer Maternal Grandmother 20  . Breast cancer Paternal Aunt 40  . Celiac disease Paternal Grandmother   . Heart disease Father   . Colon cancer Neg Hx   . Mental illness Mother   . Bipolar disorder Maternal Grandmother   . Mental illness Brother   . Other Mother     cyst in  right breast   History  Substance Use Topics  . Smoking status: Never Smoker   . Smokeless tobacco: Never Used  . Alcohol Use: No   OB History    Gravida Para Term Preterm AB TAB SAB Ectopic Multiple Living   3 3 3       3      Review of Systems  All other systems negative except as documented in the HPI. All pertinent positives and negatives as reviewed in the HPI.  Allergies  Gluten meal  Home Medications   Prior to Admission medications   Medication Sig Start Date End Date Taking? Authorizing Provider  acetaminophen (TYLENOL) 325 MG tablet Take 650 mg by mouth every 6 (six) hours as needed for moderate pain (pain).   Yes Historical Provider, MD  aspirin 325 MG tablet Take 650 mg by mouth every 4 (four) hours as needed for mild pain (pain).    Yes Historical Provider, MD  buPROPion (WELLBUTRIN XL) 300 MG 24 hr tablet Take 1 tablet (300 mg total) by mouth daily with breakfast. 10/25/14  Yes Cleotis Nipper, MD  escitalopram (LEXAPRO) 20 MG tablet Take 1 tablet (20 mg total) by mouth daily. 10/25/14  Yes Cleotis Nipper, MD  estradiol (ESTRACE) 2 MG tablet Take 2 mg by mouth daily. For hormone replacement (Low estrogen) 07/13/13  Yes Sanjuana Kava,  NP  gabapentin (NEURONTIN) 100 MG capsule Take 3 capsules (300 mg total) by mouth at bedtime. 10/18/14  Yes Huston Foley, MD  ibandronate (BONIVA) 150 MG tablet Take 150 mg by mouth every 30 (thirty) days. Take in the morning with a full glass of water, on an empty stomach, and do not take anything else by mouth or lie down for the next 30 min.   Yes Historical Provider, MD  Linaclotide Karlene Einstein) 145 MCG CAPS capsule Take 1 capsule (145 mcg total) by mouth daily. 05/25/14  Yes Hilarie Fredrickson, MD  LORazepam (ATIVAN) 1 MG tablet Take 1 tablet (1 mg total) by mouth daily as needed for anxiety. 07/07/14  Yes Cleotis Nipper, MD  meloxicam (MOBIC) 7.5 MG tablet Take 7.5 mg by mouth daily.   Yes Historical Provider, MD  mirtazapine (REMERON) 15 MG tablet Take  1 tablet (15 mg total) by mouth at bedtime. 10/25/14 10/25/15 Yes Cleotis Nipper, MD  montelukast (SINGULAIR) 10 MG tablet Take 1 tablet (10 mg total) by mouth at bedtime. Patient taking differently: Take 10 mg by mouth every morning.  10/12/14  Yes Donita Brooks, MD  ondansetron (ZOFRAN) 4 MG tablet Take 1 tablet (4 mg total) by mouth every 6 (six) hours. Patient taking differently: Take 4 mg by mouth every 6 (six) hours as needed for nausea or vomiting.  07/10/14  Yes Mora Bellman, PA-C  PREMARIN vaginal cream Place 1 Applicatorful vaginally at bedtime.  08/30/14  Yes Historical Provider, MD  topiramate (TOPAMAX) 100 MG tablet Take 1-1.5 tablets (100-150 mg total) by mouth 2 (two) times daily. Take 100 mg in am & Take 150 mg in pm. 10/25/14  Yes Cleotis Nipper, MD  fexofenadine [facility] ALLERGY) 180 MG tablet Take 1 tablet (180 mg total) by mouth daily. Patient not taking: Reported on 11/13/2014 09/27/14   Salley Scarlet, MD  SUMAtriptan (IMITREX) 100 MG tablet Take one pill as needed for migraine attack. Do not take more than 2 times per week. 10/18/14   Huston Foley, MD  triamcinolone (NASACORT ALLERGY 24HR) 55 MCG/ACT AERO nasal inhaler Place 2 sprays into the nose daily. Patient not taking: Reported on 11/13/2014 09/27/14   Salley Scarlet, MD   BP 104/67 mmHg  Pulse 55  Temp(Src) 98.4 F (36.9 C) (Oral)  Resp 16  SpO2 100%  LMP 12/14/2003 Physical Exam  Constitutional: She is oriented to person, place, and time. She appears well-developed and well-nourished. No distress.  HENT:  Head: Normocephalic and atraumatic.  Mouth/Throat: Oropharynx is clear and moist.  Eyes: Conjunctivae and EOM are normal. Pupils are equal, round, and reactive to light.  Neck: Normal range of motion. Neck supple.  Cardiovascular: Normal rate, regular rhythm and normal heart sounds.  Exam reveals no gallop and no friction rub.   No murmur heard. Pulmonary/Chest: Effort normal and breath sounds normal. No  respiratory distress.  Musculoskeletal: She exhibits no edema.  Neurological: She is alert and oriented to person, place, and time. She exhibits normal muscle tone. Coordination normal.  Skin: Skin is warm and dry. No rash noted. No erythema.  Nursing note and vitals reviewed.   ED Course  Procedures (including critical care time)  Rechecked the patient at 2:48 PM and she stating that her headache is better and I advised her that we will discharge her home.  Patient agrees the plan and also advised to follow up with her primary care doctor MDM   Final diagnoses:  None  Jamesetta Orleans Glynn Freas, PA-C 11/13/14 1508  Elwin Mocha, MD 11/14/14 [phone]

## 2014-11-13 NOTE — Discharge Instructions (Signed)
Return here as needed.  Follow-up with your neurologist and primary care doctor, increase your fluid intake

## 2014-11-16 DIAGNOSIS — F331 Major depressive disorder, recurrent, moderate: Secondary | ICD-10-CM | POA: Diagnosis not present

## 2014-11-16 DIAGNOSIS — F411 Generalized anxiety disorder: Secondary | ICD-10-CM | POA: Diagnosis not present

## 2014-11-18 DIAGNOSIS — N3281 Overactive bladder: Secondary | ICD-10-CM | POA: Diagnosis not present

## 2014-11-18 DIAGNOSIS — N3946 Mixed incontinence: Secondary | ICD-10-CM | POA: Diagnosis not present

## 2014-11-18 DIAGNOSIS — R312 Other microscopic hematuria: Secondary | ICD-10-CM | POA: Diagnosis not present

## 2014-11-19 ENCOUNTER — Telehealth: Payer: Self-pay | Admitting: Family Medicine

## 2014-11-19 NOTE — Telephone Encounter (Signed)
I recommend she call them back since they diagnosed ? Something to get an alternative treatment

## 2014-11-19 NOTE — Telephone Encounter (Signed)
Has seen Rheumatologist.  Has found a problem.  Has put her on Mobic 7.5 mg and is helping a lot.  This weather has her hurting again.  Took a second Mobic but it has not helped.  Has not been able to get in touch with them.  Wants something for pain.  Thinks maybe she needs some prednisone or Vicodin.

## 2014-11-19 NOTE — Telephone Encounter (Signed)
Call placed to patient and patient made aware per VM.  

## 2014-11-30 DIAGNOSIS — F411 Generalized anxiety disorder: Secondary | ICD-10-CM | POA: Diagnosis not present

## 2014-11-30 DIAGNOSIS — F331 Major depressive disorder, recurrent, moderate: Secondary | ICD-10-CM | POA: Diagnosis not present

## 2014-12-10 DIAGNOSIS — R311 Benign essential microscopic hematuria: Secondary | ICD-10-CM | POA: Diagnosis not present

## 2014-12-13 ENCOUNTER — Emergency Department (HOSPITAL_COMMUNITY)
Admission: EM | Admit: 2014-12-13 | Discharge: 2014-12-14 | Disposition: A | Discharge status: Home / Self Care | Payer: Medicare Other | Attending: Emergency Medicine | Admitting: Emergency Medicine

## 2014-12-13 ENCOUNTER — Encounter (HOSPITAL_COMMUNITY): Payer: Self-pay | Admitting: Emergency Medicine

## 2014-12-13 DIAGNOSIS — E871 Hypo-osmolality and hyponatremia: Secondary | ICD-10-CM | POA: Insufficient documentation

## 2014-12-13 DIAGNOSIS — F419 Anxiety disorder, unspecified: Secondary | ICD-10-CM | POA: Insufficient documentation

## 2014-12-13 DIAGNOSIS — Z8742 Personal history of other diseases of the female genital tract: Secondary | ICD-10-CM | POA: Diagnosis not present

## 2014-12-13 DIAGNOSIS — Z7982 Long term (current) use of aspirin: Secondary | ICD-10-CM | POA: Diagnosis not present

## 2014-12-13 DIAGNOSIS — Z7952 Long term (current) use of systemic steroids: Secondary | ICD-10-CM | POA: Insufficient documentation

## 2014-12-13 DIAGNOSIS — Z791 Long term (current) use of non-steroidal anti-inflammatories (NSAID): Secondary | ICD-10-CM | POA: Diagnosis not present

## 2014-12-13 DIAGNOSIS — F329 Major depressive disorder, single episode, unspecified: Secondary | ICD-10-CM | POA: Insufficient documentation

## 2014-12-13 DIAGNOSIS — Z79899 Other long term (current) drug therapy: Secondary | ICD-10-CM | POA: Insufficient documentation

## 2014-12-13 DIAGNOSIS — R51 Headache: Secondary | ICD-10-CM | POA: Diagnosis present

## 2014-12-13 DIAGNOSIS — G43909 Migraine, unspecified, not intractable, without status migrainosus: Secondary | ICD-10-CM | POA: Diagnosis not present

## 2014-12-13 DIAGNOSIS — K5909 Other constipation: Secondary | ICD-10-CM

## 2014-12-13 DIAGNOSIS — Z8739 Personal history of other diseases of the musculoskeletal system and connective tissue: Secondary | ICD-10-CM | POA: Insufficient documentation

## 2014-12-13 DIAGNOSIS — Z7983 Long term (current) use of bisphosphonates: Secondary | ICD-10-CM | POA: Diagnosis not present

## 2014-12-13 DIAGNOSIS — G8929 Other chronic pain: Secondary | ICD-10-CM | POA: Diagnosis not present

## 2014-12-13 DIAGNOSIS — G43009 Migraine without aura, not intractable, without status migrainosus: Secondary | ICD-10-CM

## 2014-12-13 DIAGNOSIS — K5900 Constipation, unspecified: Secondary | ICD-10-CM | POA: Insufficient documentation

## 2014-12-13 LAB — COMPREHENSIVE METABOLIC PANEL
ALK PHOS: 39 U/L (ref 39–117)
ALT: 15 U/L (ref 0–35)
AST: 18 U/L (ref 0–37)
Albumin: 4.2 g/dL (ref 3.5–5.2)
Anion gap: 7 (ref 5–15)
BILIRUBIN TOTAL: 0.3 mg/dL (ref 0.3–1.2)
BUN: 10 mg/dL (ref 6–23)
CALCIUM: 8.8 mg/dL (ref 8.4–10.5)
CO2: 23 mmol/L (ref 19–32)
CREATININE: 0.7 mg/dL (ref 0.50–1.10)
Chloride: 99 mmol/L (ref 96–112)
GFR calc non Af Amer: >90 mL/min (ref >90)
GLUCOSE: 91 mg/dL (ref 70–99)
Potassium: 3.8 mmol/L (ref 3.5–5.1)
Sodium: 129 mmol/L — ABNORMAL LOW (ref 135–145)
Total Protein: 6.8 g/dL (ref 6.0–8.3)

## 2014-12-13 LAB — URINALYSIS, ROUTINE W REFLEX MICROSCOPIC
Glucose, UA: NEGATIVE mg/dL
Hgb urine dipstick: NEGATIVE
Ketones, ur: NEGATIVE mg/dL
Leukocytes, UA: NEGATIVE
NITRITE: NEGATIVE
Protein, ur: NEGATIVE mg/dL
Specific Gravity, Urine: 1.002 — ABNORMAL LOW (ref 1.005–1.030)
UROBILINOGEN UA: 0.2 mg/dL (ref 0.0–1.0)
pH: 6.5 (ref 5.0–8.0)

## 2014-12-13 LAB — CBC
HEMATOCRIT: 35.2 % — AB (ref 36.0–46.0)
HEMOGLOBIN: 12.2 g/dL (ref 12.0–15.0)
MCH: 30 pg (ref 26.0–34.0)
MCHC: 34.7 g/dL (ref 30.0–36.0)
MCV: 86.7 fL (ref 78.0–100.0)
PLATELETS: 273 10*3/uL (ref 150–400)
RBC: 4.06 MIL/uL (ref 3.87–5.11)
RDW: 12.1 % (ref 11.5–15.5)
WBC: 5.4 10*3/uL (ref 4.0–10.5)

## 2014-12-13 MED ORDER — DIPHENHYDRAMINE HCL 50 MG/ML IJ SOLN
25.0000 mg | Freq: Once | INTRAMUSCULAR | Status: AC
  Administered 2014-12-13: 25 mg via INTRAVENOUS
  Filled 2014-12-13: qty 1

## 2014-12-13 MED ORDER — PROCHLORPERAZINE EDISYLATE 5 MG/ML IJ SOLN
10.0000 mg | Freq: Once | INTRAMUSCULAR | Status: AC
  Administered 2014-12-13: 10 mg via INTRAVENOUS
  Filled 2014-12-13: qty 2

## 2014-12-13 MED ORDER — SODIUM CHLORIDE 0.9 % IV BOLUS (SEPSIS)
1000.0000 mL | Freq: Once | INTRAVENOUS | Status: AC
Start: 2014-12-13 — End: 2014-12-13
  Administered 2014-12-13: 1000 mL via INTRAVENOUS

## 2014-12-13 MED ORDER — MORPHINE SULFATE 4 MG/ML IJ SOLN
4.0000 mg | Freq: Once | INTRAMUSCULAR | Status: AC
  Administered 2014-12-13: 4 mg via INTRAVENOUS
  Filled 2014-12-13: qty 1

## 2014-12-13 MED ORDER — KETOROLAC TROMETHAMINE 30 MG/ML IJ SOLN
30.0000 mg | Freq: Once | INTRAMUSCULAR | Status: AC
  Administered 2014-12-13: 30 mg via INTRAVENOUS
  Filled 2014-12-13: qty 1

## 2014-12-13 NOTE — ED Notes (Signed)
Pt stated she has had a dry cough since August that only bothers her at night time.

## 2014-12-13 NOTE — ED Notes (Addendum)
Pt c/o diarrhea onset yesterday, followed by dizziness, headache, confusion, abdominal pain yesterday, pt worried she may be hyponatremic due to Hx of the same.

## 2014-12-13 NOTE — ED Provider Notes (Signed)
TIME SEEN: 9:05 PM  CHIEF COMPLAINT: Dizzy, headache, confusion, diarrhea  HPI: Pt is a 34 y.o. female with history of chronic constipation, frequent headaches, fibromyalgia, reported history of lupus who presents to the emergency department multiple complaints. Reports she has a chronic history of problems with constipation and took magnesium citrate yesterday and has had diarrhea since. She states that she has had diffuse throbbing headache, feels that she has been confused like she is "in a fall" and has had generalized weakness and fatigue. No chest pain or shortness of breath. No vomiting. She has had dysuria but no hematuria. No numbness, tingling or focal weakness. No fever. Reports she has a nurse that is a neighbor who told her that hyponatremic and may be causing her symptoms. She has been hyponatremic in the past but this was treated in the ED and she was discharged home. Denies being on diuretics. She is on multiple different medications.  ROS: See HPI Constitutional: no fever  Eyes: no drainage  ENT: no runny nose   Cardiovascular:  no chest pain  Resp: no SOB  GI: no vomiting GU: no dysuria Integumentary: no rash  Allergy: no hives  Musculoskeletal: no leg swelling  Neurological: no slurred speech ROS otherwise negative  PAST MEDICAL HISTORY/PAST SURGICAL HISTORY:  Past Medical History  Diagnosis Date  . PTSD (post-traumatic stress disorder)   . Uterine prolapse 2013  . Depression   . Hiatal hernia   . Surgical menopause   . Chronic headache   . Constipation   . Esophageal reflux     no meds  . Osteopenia   . Eating disorder   . Anxiety   . Osteoporosis   . Fibromyalgia     MEDICATIONS:  Prior to Admission medications   Medication Sig Start Date End Date Taking? Authorizing Provider  acetaminophen (TYLENOL) 325 MG tablet Take 650 mg by mouth every 6 (six) hours as needed for moderate pain (pain).    Historical Provider, MD  aspirin 325 MG tablet Take 650 mg by  mouth every 4 (four) hours as needed for mild pain (pain).     Historical Provider, MD  buPROPion (WELLBUTRIN XL) 300 MG 24 hr tablet Take 1 tablet (300 mg total) by mouth daily with breakfast. 10/25/14   Cleotis Nipper, MD  escitalopram (LEXAPRO) 20 MG tablet Take 1 tablet (20 mg total) by mouth daily. 10/25/14   Cleotis Nipper, MD  estradiol (ESTRACE) 2 MG tablet Take 2 mg by mouth daily. For hormone replacement (Low estrogen) 07/13/13   Sanjuana Kava, NP  fexofenadine [facility] ALLERGY) 180 MG tablet Take 1 tablet (180 mg total) by mouth daily. Patient not taking: Reported on 11/13/2014 09/27/14   Salley Scarlet, MD  gabapentin (NEURONTIN) 100 MG capsule Take 3 capsules (300 mg total) by mouth at bedtime. 10/18/14   Huston Foley, MD  ibandronate (BONIVA) 150 MG tablet Take 150 mg by mouth every 30 (thirty) days. Take in the morning with a full glass of water, on an empty stomach, and do not take anything else by mouth or lie down for the next 30 min.    Historical Provider, MD  Linaclotide Karlene Einstein) 145 MCG CAPS capsule Take 1 capsule (145 mcg total) by mouth daily. 05/25/14   Hilarie Fredrickson, MD  LORazepam (ATIVAN) 1 MG tablet Take 1 tablet (1 mg total) by mouth daily as needed for anxiety. 07/07/14   Cleotis Nipper, MD  meloxicam (MOBIC) 7.5 MG tablet Take 7.5  mg by mouth daily.    Historical Provider, MD  mirtazapine (REMERON) 15 MG tablet Take 1 tablet (15 mg total) by mouth at bedtime. 10/25/14 10/25/15  Cleotis Nipper, MD  montelukast (SINGULAIR) 10 MG tablet Take 1 tablet (10 mg total) by mouth at bedtime. Patient taking differently: Take 10 mg by mouth every morning.  10/12/14   Donita Brooks, MD  ondansetron (ZOFRAN) 4 MG tablet Take 1 tablet (4 mg total) by mouth every 6 (six) hours. Patient taking differently: Take 4 mg by mouth every 6 (six) hours as needed for nausea or vomiting.  07/10/14   Mora Bellman, PA-C  PREMARIN vaginal cream Place 1 Applicatorful vaginally at bedtime.  08/30/14    Historical Provider, MD  SUMAtriptan (IMITREX) 100 MG tablet Take one pill as needed for migraine attack. Do not take more than 2 times per week. 10/18/14   Huston Foley, MD  topiramate (TOPAMAX) 100 MG tablet Take 1-1.5 tablets (100-150 mg total) by mouth 2 (two) times daily. Take 100 mg in am & Take 150 mg in pm. 10/25/14   Cleotis Nipper, MD  triamcinolone (NASACORT ALLERGY 24HR) 55 MCG/ACT AERO nasal inhaler Place 2 sprays into the nose daily. Patient not taking: Reported on 11/13/2014 09/27/14   Salley Scarlet, MD    ALLERGIES:  Allergies  Allergen Reactions  . Gluten Meal Other (See Comments)    Bloating     SOCIAL HISTORY:  History  Substance Use Topics  . Smoking status: Never Smoker   . Smokeless tobacco: Never Used  . Alcohol Use: No    FAMILY HISTORY: Family History  Problem Relation Age of Onset  . Breast cancer Maternal Grandmother 41  . Breast cancer Paternal Aunt 40  . Celiac disease Paternal Grandmother   . Heart disease Father   . Colon cancer Neg Hx   . Mental illness Mother   . Bipolar disorder Maternal Grandmother   . Mental illness Brother   . Other Mother     cyst in right breast    EXAM: BP 121/79 mmHg  Pulse 71  Temp(Src) 97.9 F (36.6 C) (Oral)  Resp 16  SpO2 100%  LMP 12/14/2003 CONSTITUTIONAL: Alert and oriented 3 and responds appropriately to questions. Well-appearing; well-nourished HEAD: Normocephalic EYES: Conjunctivae clear, PERRL ENT: normal nose; no rhinorrhea; dry mucous membranes; pharynx without lesions noted NECK: Supple, no meningismus, no LAD  CARD: RRR; S1 and S2 appreciated; no murmurs, no clicks, no rubs, no gallops RESP: Normal chest excursion without splinting or tachypnea; breath sounds clear and equal bilaterally; no wheezes, no rhonchi, no rales,  ABD/GI: Normal bowel sounds; non-distended; soft, non-tender, no rebound, no guarding BACK:  The back appears normal and is non-tender to palpation, there is no CVA  tenderness EXT: Normal ROM in all joints; non-tender to palpation; no edema; normal capillary refill; no cyanosis    SKIN: Normal color for age and race; warm NEURO: Moves all extremities equally, strength 5/5 in all 4 choice, sensation to light touch intact diffusely, cranial nerves II through XII intact, no dysarthria or aphasia, NIH stroke scale is 0 PSYCH: The patient's mood and manner are appropriate. Grooming and personal hygiene are appropriate.  MEDICAL DECISION MAKING: Patient here with multiple vague complaints with a benign exam. She is neurologically intact, hemodynamically stable. She is to have a migraine headache. Reports Toradol, Compazine usually help with her headache. She is mildly hyponatremic but doubt this is the cause of her symptoms. We'll  give IV hydration and she does appear dry on exam. We'll also obtain urinalysis given she is complaining of dysuria. Anticipate discharge home.  ED PROGRESS: Patient's urine shows no sign of infection. Urine has a specific gravity of 1.002. Patient may be drinking too much water causing her hyponatremia. Have advised her to fluid restrict. Have recommended close outpatient follow-up with Dr. Jeanice Lim her PCP. She states that her headache is not improved with migraine cocktail. She is afebrile, no meningismus, no neurologic deficits, no sudden onset headache or thunderclap headache. I do not feel she needs a head CT or lumbar puncture at this time. Will give dose of IV morphine prior to discharge home. Feel she is safe to be discharged. Discussed return precautions. She verbalizes understanding and is comfortable with plan.      EKG Interpretation  Date/Time:  Monday December 13 2014 18:09:29 EST Ventricular Rate:  78 PR Interval:  149 QRS Duration: 90 QT Interval:  394 QTC Calculation: 449 R Axis:   61 Text Interpretation:  Sinus rhythm Anteroseptal infarct, old No significant change since last tracing Confirmed by WARD,  DO, KRISTEN  ([id number]) on 12/13/2014 6:13:04 PM        Layla Maw Ward, DO 12/13/14 [id number]

## 2014-12-13 NOTE — Discharge Instructions (Signed)
I recommended she restrict her fluid intake as this is likely what is causing you to have a low sodium level. Please follow-up with Dr. Jeanice Lim in 1 week to have this rechecked. Her sodium today was 129. You may use over-the-counter MiraLAX, Colace for your constipation. Otherwise your labs, urine were normal today. Your symptoms of feeling like you were "in a fog" may be secondary to all of the medications that you are taking.  Please discuss this with Dr. Jeanice Lim.   Constipation Constipation is when a person has fewer than three bowel movements a week, has difficulty having a bowel movement, or has stools that are dry, hard, or larger than normal. As people grow older, constipation is more common. If you try to fix constipation with medicines that make you have a bowel movement (laxatives), the problem may get worse. Long-term laxative use may cause the muscles of the colon to become weak. A low-fiber diet, not taking in enough fluids, and taking certain medicines may make constipation worse.  CAUSES   Certain medicines, such as antidepressants, pain medicine, iron supplements, antacids, and water pills.   Certain diseases, such as diabetes, irritable bowel syndrome (IBS), thyroid disease, or depression.   Not drinking enough water.   Not eating enough fiber-rich foods.   Stress or travel.   Lack of physical activity or exercise.   Ignoring the urge to have a bowel movement.   Using laxatives too much.  SIGNS AND SYMPTOMS   Having fewer than three bowel movements a week.   Straining to have a bowel movement.   Having stools that are hard, dry, or larger than normal.   Feeling full or bloated.   Pain in the lower abdomen.   Not feeling relief after having a bowel movement.  DIAGNOSIS  Your health care provider will take a medical history and perform a physical exam. Further testing may be done for severe constipation. Some tests may include:  A barium enema X-ray to  examine your rectum, colon, and, sometimes, your small intestine.   A sigmoidoscopy to examine your lower colon.   A colonoscopy to examine your entire colon. TREATMENT  Treatment will depend on the severity of your constipation and what is causing it. Some dietary treatments include drinking more fluids and eating more fiber-rich foods. Lifestyle treatments may include regular exercise. If these diet and lifestyle recommendations do not help, your health care provider may recommend taking over-the-counter laxative medicines to help you have bowel movements. Prescription medicines may be prescribed if over-the-counter medicines do not work.  HOME CARE INSTRUCTIONS   Eat foods that have a lot of fiber, such as fruits, vegetables, whole grains, and beans.  Limit foods high in fat and processed sugars, such as french fries, hamburgers, cookies, candies, and soda.   A fiber supplement may be added to your diet if you cannot get enough fiber from foods.   Drink enough fluids to keep your urine clear or pale yellow.   Exercise regularly or as directed by your health care provider.   Go to the restroom when you have the urge to go. Do not hold it.   Only take over-the-counter or prescription medicines as directed by your health care provider. Do not take other medicines for constipation without talking to your health care provider first.  SEEK IMMEDIATE MEDICAL CARE IF:   You have bright red blood in your stool.   Your constipation lasts for more than 4 days or gets worse.  You have abdominal or rectal pain.   You have thin, pencil-like stools.   You have unexplained weight loss. MAKE SURE YOU:   Understand these instructions.  Will watch your condition.  Will get help right away if you are not doing well or get worse. Document Released: 07/27/2004 Document Revised: 11/03/2013 Document Reviewed: 08/10/2013 [facility] Patient Information 2015 [place], [place]. This  information is not intended to replace advice given to you by your health care provider. Make sure you discuss any questions you have with your health care provider.  Hyponatremia  Hyponatremia is when the amount of salt (sodium) in your blood is too low. When sodium levels are low, your cells will absorb extra water and swell. The swelling happens throughout the body, but it mostly affects the brain. Severe brain swelling (cerebral edema), seizures, or coma can happen.  CAUSES   Heart, kidney, or liver problems.  Thyroid problems.  Adrenal gland problems.  Severe vomiting and diarrhea.  Certain medicines or illegal drugs.  Dehydration.  Drinking too much water.  Low-sodium diet. SYMPTOMS   Nausea and vomiting.  Confusion.  Lethargy.  Agitation.  Headache.  Twitching or shaking (seizures).  Unconsciousness.  Appetite loss.  Muscle weakness and cramping. DIAGNOSIS  Hyponatremia is identified by a simple blood test. Your caregiver will perform a history and physical exam to try to find the cause and type of hyponatremia. Other tests may be needed to measure the amount of sodium in your blood and urine. TREATMENT  Treatment will depend on the cause.   Fluids may be given through the vein (IV).  Medicines may be used to correct the sodium imbalance. If medicines are causing the problem, they will need to be adjusted.  Water or fluid intake may be restricted to restore proper balance. The speed of correcting the sodium problem is very important. If the problem is corrected too fast, nerve damage (sometimes unchangeable) can happen. HOME CARE INSTRUCTIONS   Only take medicines as directed by your caregiver. Many medicines can make hyponatremia worse. Discuss all your medicines with your caregiver.  Carefully follow any recommended diet, including any fluid restrictions.  You may be asked to repeat lab tests. Follow these directions.  Avoid alcohol and recreational  drugs. SEEK MEDICAL CARE IF:   You develop worsening nausea, fatigue, headache, confusion, or weakness.  Your original hyponatremia symptoms return.  You have problems following the recommended diet. SEEK IMMEDIATE MEDICAL CARE IF:   You have a seizure.  You faint.  You have ongoing diarrhea or vomiting. MAKE SURE YOU:   Understand these instructions.  Will watch your condition.  Will get help right away if you are not doing well or get worse. Document Released: 10/19/2002 Document Revised: 01/21/2012 Document Reviewed: 04/15/2011 [facility] Patient Information 2015 [place], [place]. This information is not intended to replace advice given to you by your health care provider. Make sure you discuss any questions you have with your health care provider.  Migraine Headache A migraine headache is an intense, throbbing pain on one or both sides of your head. A migraine can last for 30 minutes to several hours. CAUSES  The exact cause of a migraine headache is not always known. However, a migraine may be caused when nerves in the brain become irritated and release chemicals that cause inflammation. This causes pain. Certain things may also trigger migraines, such as:  Alcohol.  Smoking.  Stress.  Menstruation.  Aged cheeses.  Foods or drinks that contain nitrates, glutamate, aspartame,  or tyramine.  Lack of sleep.  Chocolate.  Caffeine.  Hunger.  Physical exertion.  Fatigue.  Medicines used to treat chest pain (nitroglycerine), birth control pills, estrogen, and some blood pressure medicines. SIGNS AND SYMPTOMS  Pain on one or both sides of your head.  Pulsating or throbbing pain.  Severe pain that prevents daily activities.  Pain that is aggravated by any physical activity.  Nausea, vomiting, or both.  Dizziness.  Pain with exposure to bright lights, loud noises, or activity.  General sensitivity to bright lights, loud noises, or smells. Before you get  a migraine, you may get warning signs that a migraine is coming (aura). An aura may include:  Seeing flashing lights.  Seeing bright spots, halos, or zigzag lines.  Having tunnel vision or blurred vision.  Having feelings of numbness or tingling.  Having trouble talking.  Having muscle weakness. DIAGNOSIS  A migraine headache is often diagnosed based on:  Symptoms.  Physical exam.  A CT scan or MRI of your head. These imaging tests cannot diagnose migraines, but they can help rule out other causes of headaches. TREATMENT Medicines may be given for pain and nausea. Medicines can also be given to help prevent recurrent migraines.  HOME CARE INSTRUCTIONS  Only take over-the-counter or prescription medicines for pain or discomfort as directed by your health care provider. The use of long-term narcotics is not recommended.  Lie down in a dark, quiet room when you have a migraine.  Keep a journal to find out what may trigger your migraine headaches. For example, write down:  What you eat and drink.  How much sleep you get.  Any change to your diet or medicines.  Limit alcohol consumption.  Quit smoking if you smoke.  Get 7-9 hours of sleep, or as recommended by your health care provider.  Limit stress.  Keep lights dim if bright lights bother you and make your migraines worse. SEEK IMMEDIATE MEDICAL CARE IF:   Your migraine becomes severe.  You have a fever.  You have a stiff neck.  You have vision loss.  You have muscular weakness or loss of muscle control.  You start losing your balance or have trouble walking.  You feel faint or pass out.  You have severe symptoms that are different from your first symptoms. MAKE SURE YOU:   Understand these instructions.  Will watch your condition.  Will get help right away if you are not doing well or get worse. Document Released: 10/29/2005 Document Revised: 03/15/2014 Document Reviewed: 07/06/2013 [facility]  Patient Information 2015 [place], [place]. This information is not intended to replace advice given to you by your health care provider. Make sure you discuss any questions you have with your health care provider.

## 2014-12-15 ENCOUNTER — Inpatient Hospital Stay: Payer: Medicare Other | Admitting: Family Medicine

## 2014-12-20 DIAGNOSIS — F411 Generalized anxiety disorder: Secondary | ICD-10-CM | POA: Diagnosis not present

## 2014-12-20 DIAGNOSIS — F331 Major depressive disorder, recurrent, moderate: Secondary | ICD-10-CM | POA: Diagnosis not present

## 2014-12-21 ENCOUNTER — Inpatient Hospital Stay: Payer: Medicare Other | Admitting: Family Medicine

## 2014-12-21 ENCOUNTER — Other Ambulatory Visit: Payer: Self-pay | Admitting: *Deleted

## 2014-12-21 DIAGNOSIS — E871 Hypo-osmolality and hyponatremia: Secondary | ICD-10-CM

## 2014-12-23 ENCOUNTER — Encounter: Payer: Self-pay | Admitting: Physician Assistant

## 2014-12-23 ENCOUNTER — Ambulatory Visit (INDEPENDENT_AMBULATORY_CARE_PROVIDER_SITE_OTHER): Payer: Medicare Other | Admitting: Physician Assistant

## 2014-12-23 VITALS — BP 112/70 | HR 76 | Temp 97.9°F | Resp 18 | Wt 121.0 lb

## 2014-12-23 DIAGNOSIS — E871 Hypo-osmolality and hyponatremia: Secondary | ICD-10-CM | POA: Diagnosis not present

## 2014-12-23 DIAGNOSIS — Z20818 Contact with and (suspected) exposure to other bacterial communicable diseases: Secondary | ICD-10-CM

## 2014-12-23 DIAGNOSIS — Z2089 Contact with and (suspected) exposure to other communicable diseases: Secondary | ICD-10-CM | POA: Diagnosis not present

## 2014-12-23 DIAGNOSIS — H6502 Acute serous otitis media, left ear: Secondary | ICD-10-CM

## 2014-12-23 LAB — RAPID STREP SCREEN (MED CTR MEBANE ONLY): Streptococcus, Group A Screen (Direct): NEGATIVE

## 2014-12-23 MED ORDER — AMOXICILLIN-POT CLAVULANATE 875-125 MG PO TABS: 1.0000 | ORAL_TABLET | Freq: Two times a day (BID) | ORAL | Status: DC

## 2014-12-23 NOTE — Progress Notes (Signed)
Patient ID: Regina Steele MRN: [id number], DOB: 1981-07-17, 34 y.o. Date of Encounter: 12/23/2014, 1:15 PM    Chief Complaint:  Chief Complaint  Patient presents with  . left ear ache    sore on tongue, tired     HPI: 34 y.o. year old female has her 2 daughters with her. She says that they have both been diagnosed with strep throat recently. Therefore patient is wanting strep test. Says that she has been having some sore throat since Sunday. Says that her left ear has also been hurting--this is the third day. Says that feels like a lot of pressure behind her ear feels like something is about to rupture. Says it is a deep ear ache. Has had minimal nasal congestion minimal cough and no documented fever.     Home Meds:   Outpatient Prescriptions Prior to Visit  Medication Sig Dispense Refill  . buPROPion (WELLBUTRIN XL) 300 MG 24 hr tablet Take 1 tablet (300 mg total) by mouth daily with breakfast. 30 tablet 1  . cyclobenzaprine (FLEXERIL) 10 MG tablet Take 1 tablet by mouth at bedtime as needed. Sleep/muscle spasms  0  . escitalopram (LEXAPRO) 20 MG tablet Take 1 tablet (20 mg total) by mouth daily. 30 tablet 1  . estradiol (ESTRACE) 2 MG tablet Take 2 mg by mouth daily. For hormone replacement (Low estrogen)    . etodolac (LODINE) 400 MG tablet Take 400 mg by mouth 2 (two) times daily.    . fexofenadine (ALLEGRA ALLERGY) 180 MG tablet Take 1 tablet (180 mg total) by mouth daily. 30 tablet 12  . gabapentin (NEURONTIN) 100 MG capsule Take 3 capsules (300 mg total) by mouth at bedtime. 90 capsule 5  . ibandronate (BONIVA) 150 MG tablet Take 150 mg by mouth every 30 (thirty) days. Take in the morning with a full glass of water, on an empty stomach, and do not take anything else by mouth or lie down for the next 30 min.    . Linaclotide (LINZESS) 145 MCG CAPS capsule Take 1 capsule (145 mcg total) by mouth daily. 30 capsule 11  . LORazepam (ATIVAN) 1 MG tablet Take 1 tablet (1 mg  total) by mouth daily as needed for anxiety. 30 tablet 1  . mirtazapine (REMERON) 15 MG tablet Take 1 tablet (15 mg total) by mouth at bedtime. 30 tablet 1  . montelukast (SINGULAIR) 10 MG tablet Take 1 tablet (10 mg total) by mouth at bedtime. (Patient taking differently: Take 10 mg by mouth every morning. ) 30 tablet 3  . ondansetron (ZOFRAN) 4 MG tablet Take 1 tablet (4 mg total) by mouth every 6 (six) hours. (Patient taking differently: Take 4 mg by mouth every 6 (six) hours as needed for nausea or vomiting. ) 12 tablet 0  . PREMARIN vaginal cream Place 1 Applicatorful vaginally 2 (two) times a week. Monday and thursdays  11  . SUMAtriptan (IMITREX) 100 MG tablet Take one pill as needed for migraine attack. Do not take more than 2 times per week. 10 tablet 5  . topiramate (TOPAMAX) 100 MG tablet Take 1-1.5 tablets (100-150 mg total) by mouth 2 (two) times daily. Take 100 mg in am & Take 150 mg in pm. 75 tablet 1  . triamcinolone (NASACORT ALLERGY 24HR) 55 MCG/ACT AERO nasal inhaler Place 2 sprays into the nose daily. (Patient taking differently: Place 2 sprays into the nose daily as needed (allergies). ) 1 Inhaler 12   No facility-administered medications prior  to visit.    Allergies:  Allergies  Allergen Reactions  . Gluten Meal Other (See Comments)    Bloating       Review of Systems: See HPI for pertinent ROS. All other ROS negative.    Physical Exam: Blood pressure 112/70, pulse 76, temperature 97.9 F (36.6 C), temperature source Oral, resp. rate 18, weight 121 lb (54.885 kg), last menstrual period 12/14/2003., Body mass index is 22.13 kg/(m^2). General:  WNWD WF. Holding a tissue to her left ear. HEENT: Normocephalic, atraumatic, eyes without discharge, sclera non-icteric, nares are without discharge. Bilateral auditory canals clear, TM's are without perforation. Left TM is dull and TM is bulging. Right TM normal.  Oral cavity moist, posterior pharynx without exudate, erythema,  peritonsillar abscess. Minimal erythema of posterior pharynx.  Neck: Supple. No thyromegaly. No lymphadenopathy. Lungs: Clear bilaterally to auscultation without wheezes, rales, or rhonchi. Breathing is unlabored. Heart: Regular rhythm. No murmurs, rubs, or gallops. Msk:  Strength and tone normal for age. Extremities/Skin: Warm and dry.  Neuro: Alert and oriented X 3. Moves all extremities spontaneously. Gait is normal. CNII-XII grossly in tact. Psych:  Responds to questions appropriately with a normal affect.   Results for orders placed or performed in visit on 12/23/14  Rapid strep screen  Result Value Ref Range   Source THROAT    Streptococcus, Group A Screen (Direct) NEG NEGATIVE     ASSESSMENT AND PLAN:  34 y.o. year old female with  1. Exposure to strep throat - Rapid strep screen - amoxicillin-clavulanate (AUGMENTIN) 875-125 MG per tablet; Take 1 tablet by mouth 2 (two) times daily.  Dispense: 20 tablet; Refill: 0  2. Acute serous otitis media of left ear, recurrence not specified - amoxicillin-clavulanate (AUGMENTIN) 875-125 MG per tablet; Take 1 tablet by mouth 2 (two) times daily.  Dispense: 20 tablet; Refill: 0  Told patient to start antibiotic immediately and take as directed and complete all of it. Follow-up if symptoms do not resolve with completion of antibiotic.  3. Hyponatremia---patient says that Dr. Jeanice Lim had told her to come in and do some labs. This lab was already in lab section as future order. - COMPLETE METABOLIC PANEL WITH GFR   Signed, [address] [place], [place], [facility] 12/23/2014 1:15 PM

## 2014-12-24 LAB — COMPLETE METABOLIC PANEL WITH GFR
ALK PHOS: 38 U/L — AB (ref 39–117)
ALT: 15 U/L (ref 0–35)
AST: 15 U/L (ref 0–37)
Albumin: 4 g/dL (ref 3.5–5.2)
BILIRUBIN TOTAL: 0.3 mg/dL (ref 0.2–1.2)
BUN: 11 mg/dL (ref 6–23)
CO2: 22 mEq/L (ref 19–32)
CREATININE: 0.81 mg/dL (ref 0.50–1.10)
Calcium: 9 mg/dL (ref 8.4–10.5)
Chloride: 112 mEq/L (ref 96–112)
GFR, Est African American: >89 mL/min
GFR, Est Non African American: >89 mL/min
Glucose, Bld: 69 mg/dL — ABNORMAL LOW (ref 70–99)
Potassium: 4.6 mEq/L (ref 3.5–5.3)
Sodium: 142 mEq/L (ref 135–145)
Total Protein: 6.6 g/dL (ref 6.0–8.3)

## 2014-12-27 ENCOUNTER — Emergency Department (HOSPITAL_COMMUNITY)
Admission: EM | Admit: 2014-12-27 | Discharge: 2014-12-27 | Disposition: A | Discharge status: Home / Self Care | Payer: Medicare Other | Attending: Emergency Medicine | Admitting: Emergency Medicine

## 2014-12-27 ENCOUNTER — Encounter (HOSPITAL_COMMUNITY): Payer: Self-pay | Admitting: Emergency Medicine

## 2014-12-27 ENCOUNTER — Ambulatory Visit (HOSPITAL_COMMUNITY): Payer: Self-pay | Admitting: Psychiatry

## 2014-12-27 DIAGNOSIS — Z8719 Personal history of other diseases of the digestive system: Secondary | ICD-10-CM | POA: Insufficient documentation

## 2014-12-27 DIAGNOSIS — Z79899 Other long term (current) drug therapy: Secondary | ICD-10-CM | POA: Insufficient documentation

## 2014-12-27 DIAGNOSIS — Z792 Long term (current) use of antibiotics: Secondary | ICD-10-CM | POA: Diagnosis not present

## 2014-12-27 DIAGNOSIS — M797 Fibromyalgia: Secondary | ICD-10-CM | POA: Insufficient documentation

## 2014-12-27 DIAGNOSIS — G8929 Other chronic pain: Secondary | ICD-10-CM | POA: Insufficient documentation

## 2014-12-27 DIAGNOSIS — Z8742 Personal history of other diseases of the female genital tract: Secondary | ICD-10-CM | POA: Insufficient documentation

## 2014-12-27 DIAGNOSIS — T360X5A Adverse effect of penicillins, initial encounter: Secondary | ICD-10-CM | POA: Insufficient documentation

## 2014-12-27 DIAGNOSIS — M6281 Muscle weakness (generalized): Secondary | ICD-10-CM | POA: Diagnosis not present

## 2014-12-27 DIAGNOSIS — R110 Nausea: Secondary | ICD-10-CM | POA: Diagnosis not present

## 2014-12-27 DIAGNOSIS — Z793 Long term (current) use of hormonal contraceptives: Secondary | ICD-10-CM | POA: Diagnosis not present

## 2014-12-27 DIAGNOSIS — T887XXA Unspecified adverse effect of drug or medicament, initial encounter: Secondary | ICD-10-CM

## 2014-12-27 DIAGNOSIS — R42 Dizziness and giddiness: Secondary | ICD-10-CM | POA: Insufficient documentation

## 2014-12-27 DIAGNOSIS — R0981 Nasal congestion: Secondary | ICD-10-CM | POA: Insufficient documentation

## 2014-12-27 DIAGNOSIS — M810 Age-related osteoporosis without current pathological fracture: Secondary | ICD-10-CM | POA: Insufficient documentation

## 2014-12-27 DIAGNOSIS — H748X2 Other specified disorders of left middle ear and mastoid: Secondary | ICD-10-CM | POA: Diagnosis not present

## 2014-12-27 LAB — I-STAT CHEM 8, ED
BUN: 6 mg/dL (ref 6–23)
CALCIUM ION: 1.29 mmol/L — AB (ref 1.12–1.23)
Chloride: 110 mmol/L (ref 96–112)
Creatinine, Ser: 0.7 mg/dL (ref 0.50–1.10)
Glucose, Bld: 109 mg/dL — ABNORMAL HIGH (ref 70–99)
HEMATOCRIT: 43 % (ref 36.0–46.0)
HEMOGLOBIN: 14.6 g/dL (ref 12.0–15.0)
POTASSIUM: 3.9 mmol/L (ref 3.5–5.1)
Sodium: 145 mmol/L (ref 135–145)
TCO2: 20 mmol/L (ref 0–100)

## 2014-12-27 LAB — CBC WITH DIFFERENTIAL/PLATELET
BASOS ABS: 0 10*3/uL (ref 0.0–0.1)
Basophils Relative: 0 % (ref 0–1)
EOS ABS: 0.1 10*3/uL (ref 0.0–0.7)
Eosinophils Relative: 2 % (ref 0–5)
HEMATOCRIT: 42.5 % (ref 36.0–46.0)
Hemoglobin: 14.4 g/dL (ref 12.0–15.0)
LYMPHS PCT: 31 % (ref 12–46)
Lymphs Abs: 1.4 10*3/uL (ref 0.7–4.0)
MCH: 30.3 pg (ref 26.0–34.0)
MCHC: 33.9 g/dL (ref 30.0–36.0)
MCV: 89.3 fL (ref 78.0–100.0)
MONOS PCT: 8 % (ref 3–12)
Monocytes Absolute: 0.4 10*3/uL (ref 0.1–1.0)
NEUTROS PCT: 59 % (ref 43–77)
Neutro Abs: 2.7 10*3/uL (ref 1.7–7.7)
PLATELETS: 274 10*3/uL (ref 150–400)
RBC: 4.76 MIL/uL (ref 3.87–5.11)
RDW: 12.1 % (ref 11.5–15.5)
WBC: 4.6 10*3/uL (ref 4.0–10.5)

## 2014-12-27 LAB — CK: CK TOTAL: 83 U/L (ref 7–177)

## 2014-12-27 MED ORDER — ONDANSETRON HCL 4 MG PO TABS: 4.0000 mg | ORAL_TABLET | Freq: Four times a day (QID) | ORAL | Status: AC

## 2014-12-27 MED ORDER — SODIUM CHLORIDE 0.9 % IV BOLUS (SEPSIS)
1000.0000 mL | Freq: Once | INTRAVENOUS | Status: AC
Start: 2014-12-27 — End: 2014-12-27
  Administered 2014-12-27: 1000 mL via INTRAVENOUS

## 2014-12-27 MED ORDER — ONDANSETRON HCL 4 MG/2ML IJ SOLN
4.0000 mg | Freq: Once | INTRAMUSCULAR | Status: AC
  Administered 2014-12-27: 4 mg via INTRAVENOUS
  Filled 2014-12-27: qty 2

## 2014-12-27 NOTE — Discharge Instructions (Signed)
Stop Augmentin

## 2014-12-27 NOTE — ED Provider Notes (Addendum)
CSN: [id number]     Arrival date & time 12/27/14  1305 History   First MD Initiated Contact with Patient 12/27/14 1336     Chief Complaint  Patient presents with  . muscle weakness      (Consider location/radiation/quality/duration/timing/severity/associated sxs/prior Treatment) Patient is a 34 y.o. female presenting with weakness. The history is provided by the patient.  Weakness This is a new problem. Episode onset: 3 days. The problem occurs constantly. The problem has been gradually worsening. Pertinent negatives include no abdominal pain, no headaches and no shortness of breath. Associated symptoms comments: Pt went on augmentin 5 days ago for OM and states 3 days ago developed nausea without vomiting and lightheaded feeling and generalized weakness when she stands and attempts to walk.  She denies headache or abd pain.  No localized leg pain or urinary symptoms.  She states her left ear pain has resolved.  Also having sinus congestion and takes oral meds . The symptoms are aggravated by walking and standing. The symptoms are relieved by rest. She has tried rest, food and water for the symptoms. The treatment provided no relief.    Past Medical History  Diagnosis Date  . PTSD (post-traumatic stress disorder)   . Uterine prolapse 2013  . Depression   . Hiatal hernia   . Surgical menopause   . Chronic headache   . Constipation   . Esophageal reflux     no meds  . Osteopenia   . Eating disorder   . Anxiety   . Osteoporosis   . Fibromyalgia    Past Surgical History  Procedure Laterality Date  . Bilateral oophorectomy      bilat  . Rectocele repair    . Cesarean section    . Abdominal hysterectomy     Family History  Problem Relation Age of Onset  . Breast cancer Maternal Grandmother 85  . Breast cancer Paternal Aunt 40  . Celiac disease Paternal Grandmother   . Heart disease Father   . Colon cancer Neg Hx   . Mental illness Mother   . Bipolar disorder Maternal  Grandmother   . Mental illness Brother   . Other Mother     cyst in right breast   History  Substance Use Topics  . Smoking status: Never Smoker   . Smokeless tobacco: Never Used  . Alcohol Use: No   OB History    Gravida Para Term Preterm AB TAB SAB Ectopic Multiple Living   3 3 3       3      Review of Systems  HENT: Positive for congestion and sinus pressure.   Respiratory: Negative for cough and shortness of breath.   Gastrointestinal: Positive for nausea. Negative for vomiting, abdominal pain, diarrhea and constipation.  Genitourinary: Negative for dysuria.  Neurological: Positive for weakness and light-headedness. Negative for headaches.  All other systems reviewed and are negative.     Allergies  Gluten meal  Home Medications   Prior to Admission medications   Medication Sig Start Date End Date Taking? Authorizing Provider  amoxicillin-clavulanate (AUGMENTIN) 875-125 MG per tablet Take 1 tablet by mouth 2 (two) times daily. 12/23/14  Yes 02/21/15, PA-C  buPROPion (WELLBUTRIN XL) 300 MG 24 hr tablet Take 1 tablet (300 mg total) by mouth daily with breakfast. 10/25/14  Yes 10/27/14, MD  cyclobenzaprine (FLEXERIL) 10 MG tablet Take 1 tablet by mouth at bedtime as needed. Sleep/muscle spasms 11/21/14  Yes Historical Provider, MD  escitalopram (LEXAPRO) 20 MG tablet Take 1 tablet (20 mg total) by mouth daily. 10/25/14  Yes Cleotis Nipper, MD  estradiol (ESTRACE) 2 MG tablet Take 2 mg by mouth daily. For hormone replacement (Low estrogen) 07/13/13  Yes Sanjuana Kava, NP  gabapentin (NEURONTIN) 100 MG capsule Take 3 capsules (300 mg total) by mouth at bedtime. 10/18/14  Yes Huston Foley, MD  ibandronate (BONIVA) 150 MG tablet Take 150 mg by mouth every 30 (thirty) days. Take in the morning with a full glass of water, on an empty stomach, and do not take anything else by mouth or lie down for the next 30 min.   Yes Historical Provider, MD  Linaclotide Karlene Einstein) 145 MCG CAPS  capsule Take 1 capsule (145 mcg total) by mouth daily. 05/25/14  Yes Hilarie Fredrickson, MD  LORazepam (ATIVAN) 1 MG tablet Take 1 tablet (1 mg total) by mouth daily as needed for anxiety. 07/07/14  Yes Cleotis Nipper, MD  mirtazapine (REMERON) 15 MG tablet Take 1 tablet (15 mg total) by mouth at bedtime. 10/25/14 10/25/15 Yes Cleotis Nipper, MD  montelukast (SINGULAIR) 10 MG tablet Take 1 tablet (10 mg total) by mouth at bedtime. Patient taking differently: Take 10 mg by mouth every morning.  10/12/14  Yes Donita Brooks, MD  ondansetron (ZOFRAN) 4 MG tablet Take 1 tablet (4 mg total) by mouth every 6 (six) hours. Patient taking differently: Take 4 mg by mouth every 6 (six) hours as needed for nausea or vomiting.  07/10/14  Yes Mora Bellman, PA-C  PREMARIN vaginal cream Place 1 Applicatorful vaginally 2 (two) times a week. Monday and thursdays 08/30/14  Yes Historical Provider, MD  SUMAtriptan (IMITREX) 100 MG tablet Take one pill as needed for migraine attack. Do not take more than 2 times per week. 10/18/14  Yes Huston Foley, MD  topiramate (TOPAMAX) 100 MG tablet Take 1-1.5 tablets (100-150 mg total) by mouth 2 (two) times daily. Take 100 mg in am & Take 150 mg in pm. 10/25/14  Yes Cleotis Nipper, MD  triamcinolone (NASACORT ALLERGY 24HR) 55 MCG/ACT AERO nasal inhaler Place 2 sprays into the nose daily. Patient taking differently: Place 2 sprays into the nose daily as needed (allergies).  09/27/14  Yes Salley Scarlet, MD  fexofenadine [facility] ALLERGY) 180 MG tablet Take 1 tablet (180 mg total) by mouth daily. Patient not taking: Reported on 12/27/2014 09/27/14   Salley Scarlet, MD   BP 146/90 mmHg  Pulse 96  Resp 18  SpO2 100%  LMP 12/14/2003 Physical Exam  Constitutional: She is oriented to person, place, and time. She appears well-developed and well-nourished. No distress.  Anxious appearing  HENT:  Head: Normocephalic and atraumatic.  Left Ear: A middle ear effusion is present.   Mouth/Throat: Oropharynx is clear and moist.  Cerumen impaction on the right  Eyes: Conjunctivae and EOM are normal. Pupils are equal, round, and reactive to light.  Neck: Normal range of motion. Neck supple.  Cardiovascular: Normal rate, regular rhythm and intact distal pulses.   No murmur heard. Pulmonary/Chest: Effort normal and breath sounds normal. No respiratory distress. She has no wheezes. She has no rales.  Abdominal: Soft. She exhibits no distension. There is no tenderness. There is no rebound and no guarding.  Musculoskeletal: Normal range of motion. She exhibits no edema or tenderness.  Neurological: She is alert and oriented to person, place, and time.  Skin: Skin is warm and dry. No rash noted.  No erythema.  Psychiatric: She has a normal mood and affect. Her behavior is normal.  Nursing note and vitals reviewed.   ED Course  Procedures (including critical care time) Labs Review Labs Reviewed  I-STAT CHEM 8, ED - Abnormal; Notable for the following:    Glucose, Bld 109 (*)    Calcium, Ion 1.29 (*)    All other components within normal limits  CBC WITH DIFFERENTIAL/PLATELET  CK    Imaging Review No results found.   EKG Interpretation None      MDM   Final diagnoses:  Non-dose-related adverse reaction to medication, initial encounter    Patient with multiple medical problems such as chronic constipation, frequent headaches, fibromyalgia and a reported history of lupus who presents today with a complaint of generalized weakness and dizziness upon standing and progressive nausea that occurred in the last 3 days since starting Augmentin 5 days ago for an ear infection. Patient denies diarrhea or localized pain.  On exam patient has no evidence of otitis media at this time but does have mild fluid behind the left ear. She denies any ear pain at this time. Feel most likely patient's symptoms are related to a reaction to the antibiotic. She states approximately 6 years  ago she was on Augmentin and had a severe reaction similar with nausea, dizziness and weakness.  She is a heart rate in the 90s today but otherwise normal blood pressure. She appears anxious on exam. Patient was given IV fluids and Zofran. CBC, CK, chem 8 pending also recommended the patient stop Augmentin  3:24 PM Labs wnl.  VS improved with fluids.  Pt feeling better.  Will d/c home.  Gwyneth Sprout, MD 12/27/14 1525  Gwyneth Sprout, MD 12/27/14 [id number]

## 2014-12-27 NOTE — ED Notes (Signed)
Nurse currently in room starting IV 

## 2014-12-27 NOTE — ED Notes (Addendum)
Per patient, states muscle weakness this am-states Augmentin for ear infection on Fri-patient has multiple complaints

## 2014-12-28 ENCOUNTER — Encounter: Payer: Self-pay | Admitting: *Deleted

## 2014-12-29 ENCOUNTER — Telehealth: Payer: Self-pay | Admitting: *Deleted

## 2014-12-29 NOTE — Telephone Encounter (Signed)
Received call from patient.   Reports that she has been feeling extremely sick and fatigued.   Reports that she has no energy and feels like she is shut down. States that she has no energy to function.   MD made aware and advised to have patient rest and drink plenty of fluids. Appointment scheduled for Friday.

## 2014-12-31 ENCOUNTER — Ambulatory Visit: Payer: Self-pay | Admitting: Family Medicine

## 2014-12-31 ENCOUNTER — Telehealth: Payer: Self-pay | Admitting: Neurology

## 2014-12-31 DIAGNOSIS — G43009 Migraine without aura, not intractable, without status migrainosus: Secondary | ICD-10-CM

## 2014-12-31 MED ORDER — RIZATRIPTAN BENZOATE 10 MG PO TBDP: 10.0000 mg | ORAL_TABLET | ORAL | Status: AC | PRN

## 2014-12-31 NOTE — Telephone Encounter (Signed)
Patient is calling because Imitrex is not helping her migraines. Patient's migraines are occuring more frequently almost every day. Can another medication be called in to [facility] [address].  Please call and advise. Thank you.

## 2014-12-31 NOTE — Telephone Encounter (Signed)
I think we can try maybe another triptan. Please advise patient that because she is on multiple neurotropic and psychotropic medications our options are limited. Suggest Maxalt, Rx done.

## 2014-12-31 NOTE — Telephone Encounter (Signed)
I called back.  Patient verified she is taking Gabapentin daily as instructed.  Says she feels Imitrex stopped working for her about 5 months ago, but she has forgotten to call us regarding this.  Indicates she was recently diagnosed with Lupus.  Was placed on NSAID therapy from another provider which caused side effects, so she stopped NSAIDs.  She would like to know if something else could be recommended to take for migraines.  Feels they are more frequent than before.  Please advise.  Thank you.

## 2014-12-31 NOTE — Telephone Encounter (Signed)
I called back.  Relayed providers message.  She verbalized understanding and will call us back if anything further is needed.

## 2015-01-03 ENCOUNTER — Ambulatory Visit: Payer: Medicare Other | Admitting: Family Medicine

## 2015-01-04 ENCOUNTER — Other Ambulatory Visit (HOSPITAL_COMMUNITY): Payer: Self-pay | Admitting: Psychiatry

## 2015-01-05 ENCOUNTER — Ambulatory Visit (HOSPITAL_COMMUNITY): Payer: Self-pay | Admitting: Psychiatry

## 2015-01-05 ENCOUNTER — Telehealth (HOSPITAL_COMMUNITY): Payer: Self-pay | Admitting: *Deleted

## 2015-01-05 NOTE — Telephone Encounter (Signed)
Pt called stating she may not be able to make appointment due to Rheumatoid arthritis flare up. States "pain is unbearable."

## 2015-01-06 NOTE — Telephone Encounter (Signed)
Patient no showed for evaluation on 01-05-15 after calling that date to report she was having a rheumatoid arthritis flare.  Patient has not rescheduled appointment and is requesting a refill of currently prescribed Ativan and Lexapro.

## 2015-01-09 DIAGNOSIS — J019 Acute sinusitis, unspecified: Secondary | ICD-10-CM | POA: Diagnosis not present

## 2015-01-12 DIAGNOSIS — M797 Fibromyalgia: Secondary | ICD-10-CM | POA: Diagnosis not present

## 2015-01-12 DIAGNOSIS — R760 Raised antibody titer: Secondary | ICD-10-CM | POA: Diagnosis not present

## 2015-01-12 NOTE — Telephone Encounter (Signed)
Needs appointment

## 2015-01-12 NOTE — Telephone Encounter (Signed)
Attempted to reach patient to follow up on medication refill request and missed appointment 01/05/15.  Message left requesting patient scheduled a follow up and to discuss need for new orders.

## 2015-01-14 ENCOUNTER — Encounter: Payer: Self-pay | Admitting: Family Medicine

## 2015-01-14 ENCOUNTER — Inpatient Hospital Stay: Payer: Medicare Other | Admitting: Family Medicine

## 2015-01-14 NOTE — Telephone Encounter (Signed)
Attempted to reach patient.  Denied RX's.  Patient will need an office follow up for refills per note below from [name].

## 2015-01-16 ENCOUNTER — Other Ambulatory Visit (HOSPITAL_COMMUNITY): Payer: Self-pay | Admitting: Psychiatry

## 2015-01-16 DIAGNOSIS — F4310 Post-traumatic stress disorder, unspecified: Secondary | ICD-10-CM

## 2015-01-17 NOTE — Telephone Encounter (Signed)
Telephone call with patient requesting a refill of her Lexapro which she states being out of "several days".  Patient stated she had been diagnosed now with Lupus and early rheumatoid arthritis by her rheumatoid MD, recently started on Plaquenil and Prednisone but planned to keep new appointment with [name] on 01/19/15.  Patient requested a refill of her Lexapro now as has been out "several days".  Stated understanding she may not be able to get Ativan until seen and reported still having enough Wellbutrin.  Requests [name] okay Lexapro refill for her to pick up on tomorrow 01/18/15.

## 2015-01-18 MED ORDER — ESCITALOPRAM OXALATE 20 MG PO TABS: 20.0000 mg | ORAL_TABLET | Freq: Every day | ORAL | Status: DC

## 2015-01-18 NOTE — Telephone Encounter (Signed)
Met with Dr. Arfeen who approved patient's refill of Lexapro 1 time but others denied until patient sees provider.  Patient is scheduled for evaluation on 01/19/15.  Left message for patient Lexapro medication [facility] [address] 

## 2015-01-19 ENCOUNTER — Ambulatory Visit (INDEPENDENT_AMBULATORY_CARE_PROVIDER_SITE_OTHER): Payer: Medicare Other | Admitting: Psychiatry

## 2015-01-19 ENCOUNTER — Encounter (HOSPITAL_COMMUNITY): Payer: Self-pay | Admitting: Psychiatry

## 2015-01-19 VITALS — BP 139/91 | HR 78 | Ht 62.0 in | Wt 121.0 lb

## 2015-01-19 DIAGNOSIS — F4310 Post-traumatic stress disorder, unspecified: Secondary | ICD-10-CM

## 2015-01-19 DIAGNOSIS — F319 Bipolar disorder, unspecified: Secondary | ICD-10-CM | POA: Diagnosis not present

## 2015-01-19 MED ORDER — ESCITALOPRAM OXALATE 20 MG PO TABS: 20.0000 mg | ORAL_TABLET | Freq: Every day | ORAL | Status: DC

## 2015-01-19 MED ORDER — QUETIAPINE FUMARATE 100 MG PO TABS: 100.0000 mg | ORAL_TABLET | Freq: Two times a day (BID) | ORAL | Status: DC

## 2015-01-19 MED ORDER — BUPROPION HCL ER (XL) 300 MG PO TB24: 300.0000 mg | ORAL_TABLET | Freq: Every day | ORAL | Status: AC
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
## 2015-02-01 DIAGNOSIS — F411 Generalized anxiety disorder: Secondary | ICD-10-CM | POA: Diagnosis not present

## 2015-02-01 DIAGNOSIS — F909 Attention-deficit hyperactivity disorder, unspecified type: Secondary | ICD-10-CM | POA: Diagnosis not present

## 2015-02-01 DIAGNOSIS — F331 Major depressive disorder, recurrent, moderate: Secondary | ICD-10-CM | POA: Diagnosis not present

## 2015-02-03 DIAGNOSIS — F411 Generalized anxiety disorder: Secondary | ICD-10-CM | POA: Diagnosis not present

## 2015-02-03 DIAGNOSIS — F331 Major depressive disorder, recurrent, moderate: Secondary | ICD-10-CM | POA: Diagnosis not present

## 2015-02-14 DIAGNOSIS — F411 Generalized anxiety disorder: Secondary | ICD-10-CM | POA: Diagnosis not present

## 2015-02-14 DIAGNOSIS — F331 Major depressive disorder, recurrent, moderate: Secondary | ICD-10-CM | POA: Diagnosis not present

## 2015-02-21 DIAGNOSIS — F331 Major depressive disorder, recurrent, moderate: Secondary | ICD-10-CM | POA: Diagnosis not present

## 2015-02-21 DIAGNOSIS — F411 Generalized anxiety disorder: Secondary | ICD-10-CM | POA: Diagnosis not present

## 2015-02-23 ENCOUNTER — Emergency Department (HOSPITAL_COMMUNITY)
Admission: EM | Admit: 2015-02-23 | Discharge: 2015-02-23 | Discharge status: Against Medical Advice | Payer: Medicare Other | Attending: Emergency Medicine | Admitting: Emergency Medicine

## 2015-02-23 ENCOUNTER — Encounter (HOSPITAL_COMMUNITY): Payer: Self-pay | Admitting: Emergency Medicine

## 2015-02-23 DIAGNOSIS — K2970 Gastritis, unspecified, without bleeding: Secondary | ICD-10-CM | POA: Diagnosis not present

## 2015-02-23 DIAGNOSIS — R109 Unspecified abdominal pain: Secondary | ICD-10-CM | POA: Diagnosis not present

## 2015-02-23 DIAGNOSIS — G8929 Other chronic pain: Secondary | ICD-10-CM | POA: Diagnosis not present

## 2015-02-23 DIAGNOSIS — R1110 Vomiting, unspecified: Secondary | ICD-10-CM | POA: Diagnosis not present

## 2015-02-23 NOTE — ED Notes (Addendum)
Pt c/o emesis, abdominal pain x 2 weeks after increasing plaquenil dosage. Pt states she has zofran at home, which has not been helpful. Denies diarrhea.

## 2015-02-24 ENCOUNTER — Ambulatory Visit (HOSPITAL_COMMUNITY): Payer: Self-pay | Admitting: Psychiatry

## 2015-02-24 DIAGNOSIS — F909 Attention-deficit hyperactivity disorder, unspecified type: Secondary | ICD-10-CM | POA: Diagnosis not present

## 2015-02-24 DIAGNOSIS — F331 Major depressive disorder, recurrent, moderate: Secondary | ICD-10-CM | POA: Diagnosis not present

## 2015-02-24 DIAGNOSIS — F411 Generalized anxiety disorder: Secondary | ICD-10-CM | POA: Diagnosis not present

## 2015-02-25 DIAGNOSIS — K589 Irritable bowel syndrome without diarrhea: Secondary | ICD-10-CM | POA: Diagnosis not present

## 2015-02-25 DIAGNOSIS — M329 Systemic lupus erythematosus, unspecified: Secondary | ICD-10-CM | POA: Diagnosis not present

## 2015-02-25 DIAGNOSIS — M069 Rheumatoid arthritis, unspecified: Secondary | ICD-10-CM | POA: Diagnosis not present

## 2015-02-25 DIAGNOSIS — Z79899 Other long term (current) drug therapy: Secondary | ICD-10-CM | POA: Diagnosis not present

## 2015-02-28 DIAGNOSIS — F331 Major depressive disorder, recurrent, moderate: Secondary | ICD-10-CM | POA: Diagnosis not present

## 2015-02-28 DIAGNOSIS — F411 Generalized anxiety disorder: Secondary | ICD-10-CM | POA: Diagnosis not present

## 2015-03-03 ENCOUNTER — Telehealth: Payer: Self-pay | Admitting: *Deleted

## 2015-03-03 NOTE — Telephone Encounter (Signed)
Received call from Ann Held therapist at [facility] stating that patient has LUPUS and is struggling at home physically "some days" and had recommended pt be referred to in home Health aide to help her with basic home responsiblities like house cleaning, laundry,etc pt has signed a consent at therapy office for therapist to sign and will fax to me so that we can communicate about pt.   Gunnar Fusi states that she would be happy if Dr. Jeanice Lim would call her to discuss this pt.   Please call Ann Held (at your convenience) at [phone]  ?ok to go ahead with referral to home health?

## 2015-03-03 NOTE — Telephone Encounter (Signed)
I placed the consent to speak to therapist on your desk.

## 2015-03-04 ENCOUNTER — Encounter: Payer: Self-pay | Admitting: *Deleted

## 2015-03-04 NOTE — Telephone Encounter (Signed)
I spoke with pt therapist, interestingly she has been seeing both [name] and the psychiatrist at her office. She was concerned that she needed help around the home, and wanted me to set up PCS services to assist her. Her case manager had nothing else to offer her when Mrs. Catz called DSS. She also tells me she is on plaquenil now and was diagnosed with lupus but I have no record of this, states she has been on meds for past few weeks.  - There seems to be a lot of confusion regarding her care and what she is telling providers. I will have office staff call and schedule her an appt, she has cancelled last 3 appt with me.  I will also fax over records to [facility] - release on file for their office   Call pt she needs OV next week to discuss her medications and trying to get her help at home.   I also need last OV note from rhuematology- [facility]

## 2015-03-04 NOTE — Telephone Encounter (Signed)
Okay to place referral- but this is for [facility] services- so form needs to be completed not Home Health

## 2015-03-07 ENCOUNTER — Other Ambulatory Visit (HOSPITAL_COMMUNITY): Payer: Self-pay | Admitting: Psychiatry

## 2015-03-07 DIAGNOSIS — F331 Major depressive disorder, recurrent, moderate: Secondary | ICD-10-CM | POA: Diagnosis not present

## 2015-03-07 DIAGNOSIS — F411 Generalized anxiety disorder: Secondary | ICD-10-CM | POA: Diagnosis not present

## 2015-03-07 NOTE — Telephone Encounter (Signed)
Call placed to patient. LMTRC.   Front office staff requesting records.

## 2015-03-08 NOTE — Telephone Encounter (Signed)
Call placed to patient. LMTRC.  

## 2015-03-09 ENCOUNTER — Ambulatory Visit: Payer: Medicare Other | Admitting: Physician Assistant

## 2015-03-09 DIAGNOSIS — K2950 Unspecified chronic gastritis without bleeding: Secondary | ICD-10-CM | POA: Diagnosis not present

## 2015-03-09 DIAGNOSIS — G43109 Migraine with aura, not intractable, without status migrainosus: Secondary | ICD-10-CM | POA: Diagnosis not present

## 2015-03-09 NOTE — Telephone Encounter (Signed)
Call placed to patient. LMTRC.  

## 2015-03-10 NOTE — Telephone Encounter (Signed)
Call placed to patient. LMTRC.  

## 2015-03-11 ENCOUNTER — Encounter: Payer: Self-pay | Admitting: *Deleted

## 2015-03-11 NOTE — Telephone Encounter (Signed)
Receptionist received return call from patient.   Patient reports that she cannot schedule appointment until MD receives notes from Rheumatology.   MD states that she has received notes. Patient made aware and multiple new excuses given as to why patient cannot schedule appointment.   MD to be made aware.

## 2015-03-11 NOTE — Telephone Encounter (Signed)
This encounter was created in error - please disregard.

## 2015-03-11 NOTE — Telephone Encounter (Signed)
Call placed to patient. LMTRC.   Certified letter sent to patient requesting call.

## 2015-03-21 DIAGNOSIS — R51 Headache: Secondary | ICD-10-CM | POA: Diagnosis not present

## 2015-03-25 ENCOUNTER — Encounter (HOSPITAL_COMMUNITY): Payer: Self-pay | Admitting: Emergency Medicine

## 2015-03-25 ENCOUNTER — Emergency Department (HOSPITAL_COMMUNITY)
Admission: EM | Admit: 2015-03-25 | Discharge: 2015-03-25 | Discharge status: Against Medical Advice | Payer: Medicare Other | Attending: Emergency Medicine | Admitting: Emergency Medicine

## 2015-03-25 DIAGNOSIS — R112 Nausea with vomiting, unspecified: Secondary | ICD-10-CM | POA: Diagnosis not present

## 2015-03-25 DIAGNOSIS — R42 Dizziness and giddiness: Secondary | ICD-10-CM | POA: Diagnosis not present

## 2015-03-25 LAB — BASIC METABOLIC PANEL
Anion gap: 11 (ref 5–15)
BUN: 5 mg/dL — AB (ref 6–20)
CO2: 24 mmol/L (ref 22–32)
CREATININE: 0.68 mg/dL (ref 0.44–1.00)
Calcium: 9.9 mg/dL (ref 8.9–10.3)
Chloride: 103 mmol/L (ref 101–111)
GFR calc Af Amer: >60 mL/min (ref >60)
GFR calc non Af Amer: >60 mL/min (ref >60)
GLUCOSE: 99 mg/dL (ref 65–99)
Potassium: 4.4 mmol/L (ref 3.5–5.1)
Sodium: 138 mmol/L (ref 135–145)

## 2015-03-25 LAB — CBC
HCT: 43.9 % (ref 36.0–46.0)
Hemoglobin: 15 g/dL (ref 12.0–15.0)
MCH: 30.5 pg (ref 26.0–34.0)
MCHC: 34.2 g/dL (ref 30.0–36.0)
MCV: 89.2 fL (ref 78.0–100.0)
Platelets: 275 10*3/uL (ref 150–400)
RBC: 4.92 MIL/uL (ref 3.87–5.11)
RDW: 11.7 % (ref 11.5–15.5)
WBC: 7.5 10*3/uL (ref 4.0–10.5)

## 2015-03-25 NOTE — ED Notes (Signed)
Patient was called to come back to a room x3. No answer. Another patient in the waiting room reported she left the building already.

## 2015-03-25 NOTE — ED Notes (Signed)
Bed: GM01 Expected date:  Expected time:  Means of arrival:  Comments: Baez

## 2015-03-25 NOTE — ED Notes (Signed)
Pt reports dizziness and lightheadedness since 1000 this morning. Emesis and nausea since Monday. Reports four emesis occurences in the last 24 hours. Denies blood in vomit. Denies SOB and chest pain. Pt also says, "I have a lot of pain in my neck and I think I'm getting a migraine." Endorses hx migraines. Not on blood thinners. Took her migraine medicine without alleviation today. No other c/c.

## 2015-04-08 ENCOUNTER — Telehealth: Payer: Self-pay | Admitting: Internal Medicine

## 2015-04-08 DIAGNOSIS — Z79890 Hormone replacement therapy (postmenopausal): Secondary | ICD-10-CM | POA: Diagnosis not present

## 2015-04-12 NOTE — Telephone Encounter (Signed)
Left message for pt to call back  °

## 2015-04-14 ENCOUNTER — Other Ambulatory Visit: Payer: Self-pay

## 2015-04-14 MED ORDER — LINACLOTIDE 290 MCG PO CAPS: 290.0000 ug | ORAL_CAPSULE | Freq: Every day | ORAL | Status: AC

## 2015-04-14 NOTE — Telephone Encounter (Signed)
Pt states she is still having problems with constipation. She is taking linzess 145 and had to use some enemas to go to the bathroom. Pt wants to know if she can try linzess 290. Please advise.

## 2015-04-14 NOTE — Telephone Encounter (Signed)
Pt aware, script sent to pharmacy. Pt scheduled for OV with Dr. Marina Goodell 05/23/15@10 :45am. Appt letter mailed to pt.

## 2015-04-14 NOTE — Telephone Encounter (Signed)
Yes, she can try Linzess 290 g daily. Also, she is due for routine annual follow-up in July. Thanks

## 2015-04-15 DIAGNOSIS — M797 Fibromyalgia: Secondary | ICD-10-CM | POA: Diagnosis not present

## 2015-04-15 DIAGNOSIS — M064 Inflammatory polyarthropathy: Secondary | ICD-10-CM | POA: Diagnosis not present

## 2015-04-19 ENCOUNTER — Telehealth: Payer: Self-pay

## 2015-04-19 ENCOUNTER — Ambulatory Visit: Payer: Medicare Other | Admitting: Neurology

## 2015-04-19 NOTE — Telephone Encounter (Signed)
Patient did not come to appt today.  

## 2015-04-20 ENCOUNTER — Encounter: Payer: Self-pay | Admitting: Neurology

## 2015-05-03 DIAGNOSIS — R32 Unspecified urinary incontinence: Secondary | ICD-10-CM | POA: Diagnosis not present

## 2015-05-03 DIAGNOSIS — N898 Other specified noninflammatory disorders of vagina: Secondary | ICD-10-CM | POA: Diagnosis not present

## 2015-05-03 DIAGNOSIS — R102 Pelvic and perineal pain: Secondary | ICD-10-CM | POA: Diagnosis not present

## 2015-05-23 ENCOUNTER — Ambulatory Visit: Payer: Self-pay | Admitting: Internal Medicine

## 2015-07-10 ENCOUNTER — Emergency Department (HOSPITAL_COMMUNITY)
Admission: EM | Admit: 2015-07-10 | Discharge: 2015-07-11 | Disposition: A | Discharge status: Other Acute Inpatient Hospital | Payer: Medicare Other | Attending: Emergency Medicine | Admitting: Emergency Medicine

## 2015-07-10 ENCOUNTER — Encounter (HOSPITAL_COMMUNITY): Payer: Self-pay | Admitting: *Deleted

## 2015-07-10 DIAGNOSIS — Z8739 Personal history of other diseases of the musculoskeletal system and connective tissue: Secondary | ICD-10-CM | POA: Insufficient documentation

## 2015-07-10 DIAGNOSIS — F419 Anxiety disorder, unspecified: Secondary | ICD-10-CM | POA: Diagnosis not present

## 2015-07-10 DIAGNOSIS — F29 Unspecified psychosis not due to a substance or known physiological condition: Secondary | ICD-10-CM

## 2015-07-10 DIAGNOSIS — F312 Bipolar disorder, current episode manic severe with psychotic features: Secondary | ICD-10-CM | POA: Diagnosis present

## 2015-07-10 DIAGNOSIS — Z79899 Other long term (current) drug therapy: Secondary | ICD-10-CM | POA: Insufficient documentation

## 2015-07-10 DIAGNOSIS — Z8742 Personal history of other diseases of the female genital tract: Secondary | ICD-10-CM | POA: Diagnosis not present

## 2015-07-10 DIAGNOSIS — F411 Generalized anxiety disorder: Secondary | ICD-10-CM | POA: Diagnosis present

## 2015-07-10 DIAGNOSIS — F22 Delusional disorders: Secondary | ICD-10-CM | POA: Diagnosis not present

## 2015-07-10 DIAGNOSIS — Z3202 Encounter for pregnancy test, result negative: Secondary | ICD-10-CM | POA: Diagnosis not present

## 2015-07-10 DIAGNOSIS — E871 Hypo-osmolality and hyponatremia: Secondary | ICD-10-CM | POA: Insufficient documentation

## 2015-07-10 DIAGNOSIS — Z8719 Personal history of other diseases of the digestive system: Secondary | ICD-10-CM | POA: Insufficient documentation

## 2015-07-10 DIAGNOSIS — G8929 Other chronic pain: Secondary | ICD-10-CM | POA: Diagnosis not present

## 2015-07-10 DIAGNOSIS — F3110 Bipolar disorder, current episode manic without psychotic features, unspecified: Secondary | ICD-10-CM | POA: Diagnosis not present

## 2015-07-10 DIAGNOSIS — F4310 Post-traumatic stress disorder, unspecified: Secondary | ICD-10-CM | POA: Diagnosis present

## 2015-07-10 LAB — CBC
HCT: 38.6 % (ref 36.0–46.0)
Hemoglobin: 13.6 g/dL (ref 12.0–15.0)
MCH: 30.4 pg (ref 26.0–34.0)
MCHC: 35.2 g/dL (ref 30.0–36.0)
MCV: 86.2 fL (ref 78.0–100.0)
PLATELETS: 368 10*3/uL (ref 150–400)
RBC: 4.48 MIL/uL (ref 3.87–5.11)
RDW: 11.9 % (ref 11.5–15.5)
WBC: 8.3 10*3/uL (ref 4.0–10.5)

## 2015-07-10 LAB — COMPREHENSIVE METABOLIC PANEL
ALT: 15 U/L (ref 14–54)
AST: 25 U/L (ref 15–41)
Albumin: 4.2 g/dL (ref 3.5–5.0)
Alkaline Phosphatase: 22 U/L — ABNORMAL LOW (ref 38–126)
Anion gap: 11 (ref 5–15)
BILIRUBIN TOTAL: 0.6 mg/dL (ref 0.3–1.2)
BUN: 6 mg/dL (ref 6–20)
CO2: 20 mmol/L — ABNORMAL LOW (ref 22–32)
CREATININE: 0.7 mg/dL (ref 0.44–1.00)
Calcium: 8.7 mg/dL — ABNORMAL LOW (ref 8.9–10.3)
Chloride: 95 mmol/L — ABNORMAL LOW (ref 101–111)
GFR calc Af Amer: >60 mL/min (ref >60)
GFR calc non Af Amer: >60 mL/min (ref >60)
Glucose, Bld: 105 mg/dL — ABNORMAL HIGH (ref 65–99)
Potassium: 3.1 mmol/L — ABNORMAL LOW (ref 3.5–5.1)
Sodium: 126 mmol/L — ABNORMAL LOW (ref 135–145)
TOTAL PROTEIN: 7.2 g/dL (ref 6.5–8.1)

## 2015-07-10 LAB — RAPID URINE DRUG SCREEN, HOSP PERFORMED
Amphetamines: NOT DETECTED
BARBITURATES: NOT DETECTED
Benzodiazepines: NOT DETECTED
Cocaine: NOT DETECTED
Opiates: NOT DETECTED
Tetrahydrocannabinol: NOT DETECTED

## 2015-07-10 LAB — I-STAT BETA HCG BLOOD, ED (MC, WL, AP ONLY): I-stat hCG, quantitative: <5 m[IU]/mL (ref <5)

## 2015-07-10 LAB — POC URINE PREG, ED: PREG TEST UR: NEGATIVE

## 2015-07-10 LAB — ETHANOL

## 2015-07-10 LAB — ACETAMINOPHEN LEVEL: Acetaminophen (Tylenol), Serum: <10 ug/mL — ABNORMAL LOW (ref 10–30)

## 2015-07-10 LAB — SALICYLATE LEVEL: Salicylate Lvl: <4 mg/dL (ref 2.8–30.0)

## 2015-07-10 MED ORDER — ESTRADIOL 2 MG PO TABS: 2.0000 mg | ORAL_TABLET | Freq: Every day | ORAL | Status: DC

## 2015-07-10 MED ORDER — ESCITALOPRAM OXALATE 10 MG PO TABS
20.0000 mg | ORAL_TABLET | Freq: Every day | ORAL | Status: DC
  Administered 2015-07-10 – 2015-07-11 (×2): 20 mg via ORAL
  Filled 2015-07-10 (×3): qty 2

## 2015-07-10 MED ORDER — STERILE WATER FOR INJECTION IJ SOLN
INTRAMUSCULAR | Status: AC
  Administered 2015-07-10: 10 mL
  Filled 2015-07-10: qty 10

## 2015-07-10 MED ORDER — SODIUM CHLORIDE 0.9 % IV BOLUS (SEPSIS)
1000.0000 mL | Freq: Once | INTRAVENOUS | Status: AC
  Administered 2015-07-10: 1000 mL via INTRAVENOUS

## 2015-07-10 MED ORDER — GABAPENTIN 300 MG PO CAPS
300.0000 mg | ORAL_CAPSULE | Freq: Two times a day (BID) | ORAL | Status: DC
  Administered 2015-07-10 – 2015-07-11 (×4): 300 mg via ORAL
  Filled 2015-07-10 (×4): qty 1

## 2015-07-10 MED ORDER — ZIPRASIDONE MESYLATE 20 MG IM SOLR
20.0000 mg | Freq: Once | INTRAMUSCULAR | Status: AC
  Administered 2015-07-10: 20 mg via INTRAMUSCULAR
  Filled 2015-07-10: qty 20

## 2015-07-10 MED ORDER — POTASSIUM CHLORIDE CRYS ER 20 MEQ PO TBCR
40.0000 meq | EXTENDED_RELEASE_TABLET | Freq: Once | ORAL | Status: AC
  Administered 2015-07-10: 40 meq via ORAL
  Filled 2015-07-10: qty 2

## 2015-07-10 MED ORDER — QUETIAPINE FUMARATE 100 MG PO TABS
100.0000 mg | ORAL_TABLET | Freq: Two times a day (BID) | ORAL | Status: DC
  Administered 2015-07-10 – 2015-07-11 (×3): 100 mg via ORAL
  Filled 2015-07-10 (×5): qty 1

## 2015-07-10 MED ORDER — BUPROPION HCL ER (XL) 300 MG PO TB24
300.0000 mg | ORAL_TABLET | Freq: Every day | ORAL | Status: DC
  Administered 2015-07-11: 300 mg via ORAL
  Filled 2015-07-10 (×2): qty 1
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
## 2015-07-10 NOTE — ED Notes (Signed)
Pt has in belonging bag:  Black gym pants, blue shirt, pink bra, white socks, gray and yellow tennis shoes

## 2015-07-10 NOTE — ED Notes (Addendum)
md made aware of labs, pt agreed to have IV started and receive IV fluids. Pt moved to TCU for fluid administration.   Pt keeps staying "I need to get out of here as soon as I can, please dont let them kill me". Pt asked how she can get out of here quickly, rn explained that psychaitrist was the one who decided if pt was allowed to leave. Pt asked how she could be able to leave early, rn said that pt needed to follow the psychiatrist orders and take the medications the psychiatrist ordered. Earlier today pt refused medications, but now pt agreeing to take medications.
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
## 2015-07-10 NOTE — ED Notes (Signed)
md at bedside

## 2015-07-10 NOTE — BHH Counselor (Signed)
Counselor spoek with Dr. Verdie Mosher, Psych EDP regarding patient IVC. Dr. Verdie Mosher states that she will evaluate the patient and determine if she meets criteria and complete the proper paperwork to be provided to the nurse secretary to be filed with the magistrate.  Counselor followed up with Charge Nurse Autumn who indicates that she will follow up with Dr. Verdie Mosher regarding this patient being IVC'd.  Davina Poke, LCSW Therapeutic Triage Specialist  [facility] 07/10/2015 7:48 PM'
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
## 2015-07-10 NOTE — BHH Counselor (Signed)
Counselor relayed information in shift change in case CPS calls back.   Davina Poke, LCSW Therapeutic Triage Specialist [facility] 07/10/2015 7:43 PM
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
## 2015-07-10 NOTE — ED Notes (Signed)
Pt refusing to take other medications at this time.
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
## 2015-07-10 NOTE — ED Notes (Signed)
Bed: W[facility]39 Expected date:  Expected time:  Means of arrival:  Comments: Triage 4 

## 2015-07-10 NOTE — BH Assessment (Signed)
Contacted the following facilities for placement:  AT [facility]: [facility], per [facility], per [facility], per [facility], per [facility], per Andrea [facility], per [facility], per [facility] per [facility], per [facility], per [facility], per Tina [facility], per l[facility], per Dave [facility], per [facility], per Bernad[facility], per [facility], per [facility], per Ang[facility], per Andrea [facility], per [facility], per Lois [facility], per Mary   Teyla Skidgel Ellis Carroll Lingelbach Jr, LPC, NCC, DCC Triage Specialist [phone] 
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
## 2015-07-10 NOTE — ED Notes (Signed)
Pt is not IVC but believes she is under IVC. Pt stating she does not want to stay here long term, pt conversations do not make full sense, but pt keeps repeating she wants to leave as soon as she can.

## 2015-07-11 DIAGNOSIS — E871 Hypo-osmolality and hyponatremia: Secondary | ICD-10-CM | POA: Diagnosis not present

## 2015-07-11 DIAGNOSIS — Z833 Family history of diabetes mellitus: Secondary | ICD-10-CM | POA: Diagnosis not present

## 2015-07-11 DIAGNOSIS — G8929 Other chronic pain: Secondary | ICD-10-CM | POA: Diagnosis not present

## 2015-07-11 DIAGNOSIS — F3341 Major depressive disorder, recurrent, in partial remission: Secondary | ICD-10-CM | POA: Diagnosis not present

## 2015-07-11 DIAGNOSIS — M810 Age-related osteoporosis without current pathological fracture: Secondary | ICD-10-CM | POA: Diagnosis present

## 2015-07-11 DIAGNOSIS — F515 Nightmare disorder: Secondary | ICD-10-CM | POA: Diagnosis present

## 2015-07-11 DIAGNOSIS — Z818 Family history of other mental and behavioral disorders: Secondary | ICD-10-CM | POA: Diagnosis not present

## 2015-07-11 DIAGNOSIS — F419 Anxiety disorder, unspecified: Secondary | ICD-10-CM | POA: Diagnosis not present

## 2015-07-11 DIAGNOSIS — F4325 Adjustment disorder with mixed disturbance of emotions and conduct: Secondary | ICD-10-CM | POA: Diagnosis present

## 2015-07-11 DIAGNOSIS — R1013 Epigastric pain: Secondary | ICD-10-CM | POA: Diagnosis present

## 2015-07-11 DIAGNOSIS — Z91410 Personal history of adult physical and sexual abuse: Secondary | ICD-10-CM | POA: Diagnosis not present

## 2015-07-11 DIAGNOSIS — K5900 Constipation, unspecified: Secondary | ICD-10-CM | POA: Diagnosis present

## 2015-07-11 DIAGNOSIS — F312 Bipolar disorder, current episode manic severe with psychotic features: Secondary | ICD-10-CM | POA: Diagnosis not present

## 2015-07-11 DIAGNOSIS — F4310 Post-traumatic stress disorder, unspecified: Secondary | ICD-10-CM | POA: Diagnosis present

## 2015-07-11 DIAGNOSIS — Z8249 Family history of ischemic heart disease and other diseases of the circulatory system: Secondary | ICD-10-CM | POA: Diagnosis not present

## 2015-07-11 DIAGNOSIS — F29 Unspecified psychosis not due to a substance or known physiological condition: Secondary | ICD-10-CM | POA: Diagnosis not present

## 2015-07-11 DIAGNOSIS — G43909 Migraine, unspecified, not intractable, without status migrainosus: Secondary | ICD-10-CM | POA: Diagnosis present

## 2015-07-11 DIAGNOSIS — Z823 Family history of stroke: Secondary | ICD-10-CM | POA: Diagnosis not present

## 2015-07-11 DIAGNOSIS — M797 Fibromyalgia: Secondary | ICD-10-CM | POA: Diagnosis present

## 2015-07-11 DIAGNOSIS — F22 Delusional disorders: Secondary | ICD-10-CM | POA: Insufficient documentation

## 2015-07-11 DIAGNOSIS — K589 Irritable bowel syndrome without diarrhea: Secondary | ICD-10-CM | POA: Diagnosis present

## 2015-07-11 DIAGNOSIS — Z3202 Encounter for pregnancy test, result negative: Secondary | ICD-10-CM | POA: Diagnosis not present

## 2015-07-11 LAB — BASIC METABOLIC PANEL
ANION GAP: 8 (ref 5–15)
BUN: <5 mg/dL — ABNORMAL LOW (ref 6–20)
CALCIUM: 9.2 mg/dL (ref 8.9–10.3)
CHLORIDE: 109 mmol/L (ref 101–111)
CO2: 23 mmol/L (ref 22–32)
Creatinine, Ser: 0.68 mg/dL (ref 0.44–1.00)
GFR calc non Af Amer: >60 mL/min (ref >60)
GLUCOSE: 100 mg/dL — AB (ref 65–99)
POTASSIUM: 4.2 mmol/L (ref 3.5–5.1)
Sodium: 140 mmol/L (ref 135–145)

## 2015-07-11 MED ORDER — HYDROXYCHLOROQUINE SULFATE 200 MG PO TABS
200.0000 mg | ORAL_TABLET | Freq: Two times a day (BID) | ORAL | Status: DC
  Administered 2015-07-11 (×2): 200 mg via ORAL
  Filled 2015-07-11 (×2): qty 1

## 2015-07-11 NOTE — ED Notes (Signed)
Update called to RN [facility].  [facility] at bedside to transport pt.

## 2015-07-11 NOTE — Progress Notes (Signed)
[name] was notified by nurse that patient would like to speak with her. [name] Met with patient at bedside. However, she has a visitor present.   [name] will speak with patient once her visitor leaves. Patient aware.  Regina Steele [phone] ED [name] 07/11/2015 6:20 PM

## 2015-07-11 NOTE — BH Assessment (Signed)
[facility] Assessment Progress Note  Per xxx, this pt requires psychiatric hospitalization at this time.  xxx, RN, AC has assigned pt to Rm xxx.  Pt has signed Voluntary Admission and Consent for Treatment, as well as Consent to Release Information, and signed forms have been faxed to [facility].  Pt's nurse has been notified, and agrees to send original paperwork along with pt via [place], and to call report to [phone].  Regina Canning, MA Triage Specialist [phone]

## 2015-07-11 NOTE — ED Notes (Signed)
Patient has been very needy and has had multiple requests today.  She is very tangential with flight of ideas, but that has cleared a bit throughout the day.  She did take her prescribed medications this morning and later in the day she stated she thought that the bupropion was causing her to have high blood pressure and said she did not want to take it again.  She is very focused on rights and whether or not she is allowed to refuse medications at any time.  I assured her that she is able to.  She asked if she could get a patient bill of rights to the hospital she is going to.  She was encouraged to ask them for one when she arrived there.

## 2015-07-11 NOTE — ED Notes (Signed)
Pt AAO x 3, no distress noted, cooperative and anxious.  Husband at bedside visiting with pt.  Pending transfer to [facility].  Monitoring for safety, Q 15 min checks in effect.
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
## 2015-07-11 NOTE — BH Assessment (Addendum)
[facility] Assessment Progress Note  The following facilities have been contacted to seek placement for this pt, with results as noted:  Beds available, information sent, decision pending:  [facility]   At capacity:  [facility]  At 16:05 Aram Beecham from [facility] calls to report that pt has been accepted to their wait list.  They will call when they are ready to accept pt.   Doylene Canning, MA Triage Specialist [phone]

## 2015-07-27 DIAGNOSIS — Z0000 Encounter for general adult medical examination without abnormal findings: Secondary | ICD-10-CM | POA: Diagnosis not present

## 2015-07-27 DIAGNOSIS — G4700 Insomnia, unspecified: Secondary | ICD-10-CM | POA: Diagnosis not present

## 2015-07-27 DIAGNOSIS — Z79899 Other long term (current) drug therapy: Secondary | ICD-10-CM | POA: Diagnosis not present

## 2015-07-27 DIAGNOSIS — F419 Anxiety disorder, unspecified: Secondary | ICD-10-CM | POA: Diagnosis not present

## 2015-07-27 DIAGNOSIS — K5900 Constipation, unspecified: Secondary | ICD-10-CM | POA: Diagnosis not present

## 2015-07-28 DIAGNOSIS — Z0000 Encounter for general adult medical examination without abnormal findings: Secondary | ICD-10-CM | POA: Diagnosis not present

## 2015-07-28 DIAGNOSIS — Z79899 Other long term (current) drug therapy: Secondary | ICD-10-CM | POA: Diagnosis not present

## 2015-07-28 DIAGNOSIS — J329 Chronic sinusitis, unspecified: Secondary | ICD-10-CM | POA: Diagnosis not present

## 2015-07-28 DIAGNOSIS — F411 Generalized anxiety disorder: Secondary | ICD-10-CM | POA: Diagnosis not present

## 2015-07-28 DIAGNOSIS — Z136 Encounter for screening for cardiovascular disorders: Secondary | ICD-10-CM | POA: Diagnosis not present

## 2015-07-30 ENCOUNTER — Emergency Department (HOSPITAL_COMMUNITY)
Admission: EM | Admit: 2015-07-30 | Discharge: 2015-08-01 | Disposition: A | Discharge status: Other Acute Inpatient Hospital | Payer: Medicare Other | Attending: Emergency Medicine | Admitting: Emergency Medicine

## 2015-07-30 ENCOUNTER — Encounter (HOSPITAL_COMMUNITY): Payer: Self-pay | Admitting: Emergency Medicine

## 2015-07-30 DIAGNOSIS — Z8739 Personal history of other diseases of the musculoskeletal system and connective tissue: Secondary | ICD-10-CM | POA: Insufficient documentation

## 2015-07-30 DIAGNOSIS — Z79899 Other long term (current) drug therapy: Secondary | ICD-10-CM | POA: Diagnosis not present

## 2015-07-30 DIAGNOSIS — F419 Anxiety disorder, unspecified: Secondary | ICD-10-CM | POA: Insufficient documentation

## 2015-07-30 DIAGNOSIS — G8929 Other chronic pain: Secondary | ICD-10-CM | POA: Insufficient documentation

## 2015-07-30 DIAGNOSIS — F312 Bipolar disorder, current episode manic severe with psychotic features: Secondary | ICD-10-CM | POA: Diagnosis not present

## 2015-07-30 DIAGNOSIS — F29 Unspecified psychosis not due to a substance or known physiological condition: Secondary | ICD-10-CM | POA: Insufficient documentation

## 2015-07-30 DIAGNOSIS — F411 Generalized anxiety disorder: Secondary | ICD-10-CM

## 2015-07-30 DIAGNOSIS — F3110 Bipolar disorder, current episode manic without psychotic features, unspecified: Secondary | ICD-10-CM | POA: Diagnosis not present

## 2015-07-30 DIAGNOSIS — Z008 Encounter for other general examination: Secondary | ICD-10-CM | POA: Diagnosis present

## 2015-07-30 DIAGNOSIS — Z8719 Personal history of other diseases of the digestive system: Secondary | ICD-10-CM | POA: Diagnosis not present

## 2015-07-30 DIAGNOSIS — Z3202 Encounter for pregnancy test, result negative: Secondary | ICD-10-CM | POA: Insufficient documentation

## 2015-07-30 DIAGNOSIS — Z8742 Personal history of other diseases of the female genital tract: Secondary | ICD-10-CM | POA: Diagnosis not present

## 2015-07-30 LAB — RAPID URINE DRUG SCREEN, HOSP PERFORMED
Amphetamines: NOT DETECTED
Barbiturates: NOT DETECTED
Benzodiazepines: NOT DETECTED
Cocaine: NOT DETECTED
OPIATES: NOT DETECTED
TETRAHYDROCANNABINOL: NOT DETECTED

## 2015-07-30 LAB — CBC WITH DIFFERENTIAL/PLATELET
Basophils Absolute: 0 10*3/uL (ref 0.0–0.1)
Basophils Relative: 0 %
EOS ABS: 0 10*3/uL (ref 0.0–0.7)
EOS PCT: 1 %
HCT: 40.8 % (ref 36.0–46.0)
HEMOGLOBIN: 14.1 g/dL (ref 12.0–15.0)
LYMPHS ABS: 1.5 10*3/uL (ref 0.7–4.0)
Lymphocytes Relative: 32 %
MCH: 29.7 pg (ref 26.0–34.0)
MCHC: 34.6 g/dL (ref 30.0–36.0)
MCV: 85.9 fL (ref 78.0–100.0)
MONO ABS: 0.6 10*3/uL (ref 0.1–1.0)
MONOS PCT: 14 %
Neutro Abs: 2.5 10*3/uL (ref 1.7–7.7)
Neutrophils Relative %: 53 %
PLATELETS: 254 10*3/uL (ref 150–400)
RBC: 4.75 MIL/uL (ref 3.87–5.11)
RDW: 12.1 % (ref 11.5–15.5)
WBC: 4.6 10*3/uL (ref 4.0–10.5)

## 2015-07-30 LAB — ACETAMINOPHEN LEVEL: Acetaminophen (Tylenol), Serum: <10 ug/mL — ABNORMAL LOW (ref 10–30)

## 2015-07-30 LAB — COMPREHENSIVE METABOLIC PANEL
ALBUMIN: 4.6 g/dL (ref 3.5–5.0)
ALK PHOS: 27 U/L — AB (ref 38–126)
ALT: 15 U/L (ref 14–54)
AST: 22 U/L (ref 15–41)
Anion gap: 8 (ref 5–15)
BILIRUBIN TOTAL: 0.3 mg/dL (ref 0.3–1.2)
BUN: 5 mg/dL — AB (ref 6–20)
CALCIUM: 9.2 mg/dL (ref 8.9–10.3)
CO2: 23 mmol/L (ref 22–32)
CREATININE: 0.61 mg/dL (ref 0.44–1.00)
Chloride: 102 mmol/L (ref 101–111)
GFR calc Af Amer: >60 mL/min (ref >60)
GFR calc non Af Amer: >60 mL/min (ref >60)
GLUCOSE: 108 mg/dL — AB (ref 65–99)
Potassium: 3.6 mmol/L (ref 3.5–5.1)
SODIUM: 133 mmol/L — AB (ref 135–145)
TOTAL PROTEIN: 7.8 g/dL (ref 6.5–8.1)

## 2015-07-30 LAB — I-STAT BETA HCG BLOOD, ED (MC, WL, AP ONLY)

## 2015-07-30 LAB — SALICYLATE LEVEL

## 2015-07-30 LAB — ETHANOL: Alcohol, Ethyl (B): <5 mg/dL (ref <5)

## 2015-07-30 MED ORDER — TRAZODONE HCL 50 MG PO TABS
25.0000 mg | ORAL_TABLET | Freq: Every evening | ORAL | Status: DC | PRN
  Administered 2015-07-30 – 2015-07-31 (×2): 25 mg via ORAL
  Filled 2015-07-30 (×2): qty 1

## 2015-07-30 MED ORDER — ACETAMINOPHEN 325 MG PO TABS
650.0000 mg | ORAL_TABLET | ORAL | Status: DC | PRN
  Administered 2015-07-30 – 2015-08-01 (×7): 650 mg via ORAL
  Filled 2015-07-30 (×7): qty 2

## 2015-07-30 MED ORDER — LORATADINE 10 MG PO TABS
10.0000 mg | ORAL_TABLET | Freq: Once | ORAL | Status: AC
  Administered 2015-07-30: 10 mg via ORAL
  Filled 2015-07-30: qty 1

## 2015-07-30 MED ORDER — HYDROXYZINE HCL 25 MG PO TABS
25.0000 mg | ORAL_TABLET | Freq: Three times a day (TID) | ORAL | Status: DC | PRN
  Filled 2015-07-30: qty 1

## 2015-07-30 MED ORDER — LORAZEPAM 1 MG PO TABS
1.0000 mg | ORAL_TABLET | Freq: Three times a day (TID) | ORAL | Status: DC | PRN
  Administered 2015-07-30 – 2015-08-01 (×4): 1 mg via ORAL
  Filled 2015-07-30 (×6): qty 1

## 2015-07-30 MED ORDER — HYDROXYCHLOROQUINE SULFATE 200 MG PO TABS
400.0000 mg | ORAL_TABLET | Freq: Two times a day (BID) | ORAL | Status: DC
  Administered 2015-07-30 – 2015-08-01 (×4): 400 mg via ORAL
  Filled 2015-07-30 (×5): qty 2

## 2015-07-30 MED ORDER — LINACLOTIDE 290 MCG PO CAPS
290.0000 ug | ORAL_CAPSULE | Freq: Every day | ORAL | Status: DC
  Administered 2015-07-31 – 2015-08-01 (×2): 290 ug via ORAL
  Filled 2015-07-30 (×4): qty 1

## 2015-07-30 MED ORDER — GABAPENTIN 300 MG PO CAPS
300.0000 mg | ORAL_CAPSULE | Freq: Two times a day (BID) | ORAL | Status: DC
  Administered 2015-07-30 – 2015-08-01 (×4): 300 mg via ORAL
  Filled 2015-07-30 (×4): qty 1

## 2015-07-30 MED ORDER — GUAIFENESIN ER 600 MG PO TB12
600.0000 mg | ORAL_TABLET | Freq: Every day | ORAL | Status: DC
  Administered 2015-07-30 – 2015-08-01 (×3): 600 mg via ORAL
  Filled 2015-07-30 (×3): qty 1

## 2015-07-30 MED ORDER — DOXYCYCLINE HYCLATE 100 MG PO TABS
100.0000 mg | ORAL_TABLET | Freq: Two times a day (BID) | ORAL | Status: DC
  Administered 2015-07-30 – 2015-08-01 (×4): 100 mg via ORAL
  Filled 2015-07-30 (×5): qty 1
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
## 2015-07-30 NOTE — ED Provider Notes (Signed)
CSN: [id number]     Arrival date & time 07/30/15  1318 History   First MD Initiated Contact with Patient 07/30/15 1405     Chief Complaint  Patient presents with  . IVC      (Consider location/radiation/quality/duration/timing/severity/associated sxs/prior Treatment) HPI Comments: Patient presents by [facility] on an IVC. IVC was taken out by her significant other. She has a history of PTSD, depression and anxiety. Per the IVC, patient was having erratic behavior. She is sleeping with a butcher knife under her pillow. Per the IVC papers, she was throwing dishes around her children this morning. Patient denies this. She does say that she sleeping with a butcher knife under her pillow but doesn't think that this is concerning. She denies any suicidal or homicidal ideations. She states the allegations are false.   Past Medical History  Diagnosis Date  . PTSD (post-traumatic stress disorder)   . Uterine prolapse 2013  . Depression   . Hiatal hernia   . Surgical menopause   . Chronic headache   . Constipation   . Esophageal reflux     no meds  . Osteopenia   . Eating disorder   . Anxiety   . Osteoporosis   . Fibromyalgia   . IBS (irritable bowel syndrome)    Past Surgical History  Procedure Laterality Date  . Bilateral oophorectomy      bilat  . Rectocele repair    . Cesarean section    . Abdominal hysterectomy     Family History  Problem Relation Age of Onset  . Breast cancer Maternal Grandmother 37  . Breast cancer Paternal Aunt 40  . Celiac disease Paternal Grandmother   . Heart disease Father   . Colon cancer Neg Hx   . Mental illness Mother   . Bipolar disorder Maternal Grandmother   . Mental illness Brother   . Other Mother     cyst in right breast   Social History  Substance Use Topics  . Smoking status: Never Smoker   . Smokeless tobacco: Never Used  . Alcohol Use: No   OB History    Gravida Para Term Preterm AB TAB SAB Ectopic Multiple Living   3 3 3       3       Review of Systems  Constitutional: Negative for fever, chills, diaphoresis and fatigue.  HENT: Negative for congestion, rhinorrhea and sneezing.   Eyes: Negative.   Respiratory: Negative for cough, chest tightness and shortness of breath.   Cardiovascular: Negative for chest pain and leg swelling.  Gastrointestinal: Negative for nausea, vomiting, abdominal pain, diarrhea and blood in stool.  Genitourinary: Negative for frequency, hematuria, flank pain and difficulty urinating.  Musculoskeletal: Negative for back pain and arthralgias.  Skin: Negative for rash.  Neurological: Negative for dizziness, speech difficulty, weakness, numbness and headaches.  Psychiatric/Behavioral: Positive for agitation. Negative for suicidal ideas and confusion. The patient is nervous/anxious.       Allergies  Morphine and related; Augmentin; Gluten meal; and Nsaids  Home Medications   Prior to Admission medications   Medication Sig Start Date End Date Taking? Authorizing Provider  acetaminophen (TYLENOL) 500 MG tablet Take 1,000 mg by mouth every 6 (six) hours as needed for mild pain, moderate pain, fever or headache.   Yes Historical Provider, MD  gabapentin (NEURONTIN) 100 MG capsule takes 300mg  in the morning and 100mg  at night 04/05/15  Yes Historical Provider, MD  guaiFENesin (MUCINEX) 600 MG 12 hr tablet Take 600 mg  by mouth every 4 (four) hours as needed for cough or to loosen phlegm.   Yes Historical Provider, MD  ibandronate (BONIVA) 150 MG tablet Take 150 mg by mouth every 30 (thirty) days. Take in the morning with a full glass of water, on an empty stomach, and do not take anything else by mouth or lie down for the next 30 min.   Yes Historical Provider, MD  Linaclotide (LINZESS) 290 MCG CAPS capsule Take 1 capsule (290 mcg total) by mouth daily. 04/14/15  Yes Hilarie Fredrickson, MD  loratadine (CLARITIN) 10 MG tablet Take 10 mg by mouth daily.   Yes Historical Provider, MD  LORYNA 3-0.02 MG tablet  TAKE 1 TABLET(S) EVERY DAY BY ORAL ROUTE. 06/14/15  Yes Historical Provider, MD  ondansetron (ZOFRAN) 4 MG tablet Take 1 tablet (4 mg total) by mouth every 6 (six) hours. Patient taking differently: Take 4 mg by mouth every 8 (eight) hours as needed for nausea or vomiting.  12/27/14  Yes Gwyneth Sprout, MD  PLAQUENIL 200 MG tablet Take 200 mg by mouth 2 (two) times daily. 06/24/15  Yes Historical Provider, MD  PREMARIN vaginal cream Place 1 Applicatorful vaginally 2 (two) times a week. Monday and thursdays 08/30/14  Yes Historical Provider, MD  promethazine (PHENERGAN) 25 MG tablet Take 25 mg by mouth every 6 (six) hours as needed for nausea or vomiting.  06/26/15  Yes Historical Provider, MD  buPROPion (WELLBUTRIN XL) 300 MG 24 hr tablet Take 1 tablet (300 mg total) by mouth daily with breakfast. Patient not taking: Reported on 07/30/2015 01/19/15   Cleotis Nipper, MD  Linaclotide (LINZESS) 145 MCG CAPS capsule Take 1 capsule (145 mcg total) by mouth daily. Patient not taking: Reported on 07/10/2015 05/25/14   Hilarie Fredrickson, MD  montelukast (SINGULAIR) 10 MG tablet Take 1 tablet (10 mg total) by mouth at bedtime. Patient not taking: Reported on 07/10/2015 10/12/14   Donita Brooks, MD  rizatriptan (MAXALT-MLT) 10 MG disintegrating tablet Take 1 tablet (10 mg total) by mouth as needed for migraine. May repeat in 2 hours if needed Patient not taking: Reported on 07/30/2015 12/31/14   Huston Foley, MD  topiramate (TOPAMAX) 100 MG tablet Take 1-1.5 tablets (100-150 mg total) by mouth 2 (two) times daily. Take 100 mg in am & Take 150 mg in pm. Patient not taking: Reported on 07/10/2015 10/25/14   Cleotis Nipper, MD   BP 152/92 mmHg  Pulse 89  Temp(Src) 97.8 F (36.6 C) (Oral)  Resp 18  SpO2 100%  LMP 12/14/2003 Physical Exam  Constitutional: She is oriented to person, place, and time. She appears well-developed and well-nourished.  HENT:  Head: Normocephalic and atraumatic.  Eyes: Pupils are equal, round,  and reactive to light.  Neck: Normal range of motion. Neck supple.  Cardiovascular: Normal rate, regular rhythm and normal heart sounds.   Pulmonary/Chest: Effort normal and breath sounds normal. No respiratory distress. She has no wheezes. She has no rales. She exhibits no tenderness.  Abdominal: Soft. Bowel sounds are normal. There is no tenderness. There is no rebound and no guarding.  Musculoskeletal: Normal range of motion. She exhibits no edema.  Lymphadenopathy:    She has no cervical adenopathy.  Neurological: She is alert and oriented to person, place, and time.  Skin: Skin is warm and dry. No rash noted.  Psychiatric:  Patient is talking incessantly. She has very tangential thinking. She seems very paranoid and refuses to talk to anyone unless  the policeman is in the room with her.    ED Course  Procedures (including critical care time) Labs Review Labs Reviewed  COMPREHENSIVE METABOLIC PANEL - Abnormal; Notable for the following:    Sodium 133 (*)    Glucose, Bld 108 (*)    BUN 5 (*)    Alkaline Phosphatase 27 (*)    All other components within normal limits  ACETAMINOPHEN LEVEL - Abnormal; Notable for the following:    Acetaminophen (Tylenol), Serum <10 (*)    All other components within normal limits  ETHANOL  CBC WITH DIFFERENTIAL/PLATELET  SALICYLATE LEVEL  URINE RAPID DRUG SCREEN, HOSP PERFORMED  I-STAT BETA HCG BLOOD, ED ([facility], [facility], [facility] ONLY)    Imaging Review No results found. I have personally reviewed and evaluated these images and lab results as part of my medical decision-making.   EKG Interpretation None      MDM   Final diagnoses:  Psychosis, unspecified psychosis type    Patient is denying any suicidal or homicidal ideations although she does appear to have psychotic behavior. She is very paranoid and has very tangential thinking. She wants me to document every word that she sustained in the chart. She is talking incessantly. Will consult TTS and  place in psych hold    Rolan Bucco, MD 07/30/15 1549

## 2015-07-30 NOTE — ED Notes (Signed)
Patient is tearful,labile and defensive.  Speech is rambling and thoughts are disorganized. Support and redirection given.

## 2015-07-30 NOTE — ED Notes (Signed)
Patient continues to be demanding and manipulative. Resistant to redirection. Report to [facility].

## 2015-07-30 NOTE — ED Notes (Signed)
Pt BIB by [facility] under IVC, she has hx of depression and anxiety, taking her meds as prescribed, however her husband sts she is behaving erratically and he is concerned for hers and children's safety. Sts pt is sleeping with knife.

## 2015-07-30 NOTE — ED Notes (Signed)
Patient is intrusive and demanding.  Difficult to redirect.

## 2015-07-30 NOTE — ED Notes (Signed)
Bed: [facility]41 Expected date:  Expected time:  Means of arrival:  Comments: Hold for triage 3 

## 2015-07-30 NOTE — ED Notes (Signed)
Patient's parents called multiple times tonight to make sure she was put on her regular "Lupus" medications. Parents do not feel she needs to be here and are currently supportive. They do not want her to stay in the hospital because it interferes with her Lupus. Patient's doctor's office contact per patient request and medication list obtained from Dr. Doristine Locks. ED doctor reordered and patient was given all medications.

## 2015-07-30 NOTE — BHH Counselor (Signed)
Patient will be observed overnight and evaluated in the morning by psychiatry to uphold or rescind the IVC per [facility]. EDP prefers patient see psychiatrist for final disposition.   Davina Poke, LCSW Therapeutic Triage Specialist  Health 07/30/2015 3:48 PM
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
## 2015-07-31 DIAGNOSIS — F29 Unspecified psychosis not due to a substance or known physiological condition: Secondary | ICD-10-CM | POA: Diagnosis not present

## 2015-07-31 DIAGNOSIS — F3110 Bipolar disorder, current episode manic without psychotic features, unspecified: Secondary | ICD-10-CM | POA: Diagnosis not present

## 2015-07-31 MED ORDER — TOPIRAMATE 100 MG PO TABS
100.0000 mg | ORAL_TABLET | Freq: Two times a day (BID) | ORAL | Status: DC
  Filled 2015-07-31 (×3): qty 1

## 2015-07-31 MED ORDER — QUETIAPINE FUMARATE 25 MG PO TABS
25.0000 mg | ORAL_TABLET | Freq: Two times a day (BID) | ORAL | Status: DC
  Filled 2015-07-31 (×3): qty 1

## 2015-07-31 NOTE — ED Notes (Signed)
Patient on telephone in hallway.  Safety checks maintained through q 15 min checks and camera monitoring. Stretcher in lowest position in room. Call bell within reach. Will continue to monitor.

## 2015-07-31 NOTE — ED Notes (Signed)
Patient in room walking around.  Safety checks maintained through q 15 min checks and camera monitoring. Stretcher in lowest position in room. Call bell within reach. Will continue to monitor.

## 2015-07-31 NOTE — ED Notes (Addendum)
Patient on telephone in hallway.  Safety checks maintained through q 15 min checks and camera monitoring. Stretcher in lowest position in room. Call bell within reach. Will continue to monitor.  

## 2015-07-31 NOTE — Progress Notes (Addendum)
12:09pm. IVC upheld by Dr. Shela Commons. [name] faxed and filed paperwork.  [facility] Clinical Social Worker [facility] phone: [phone]

## 2015-07-31 NOTE — ED Notes (Signed)
Patient is alert and oriented X 4. Denies SI, HI, AVH. Gave patient a copy of the Bill of Rights.
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
## 2015-07-31 NOTE — Progress Notes (Addendum)
2:13pm. Pt's father, Jonny Ruiz who lives in [place], called Child psychotherapist. [name] spoke briefly with father in presence of pt. Asked if there was anything he could do to help pt from afar and asked that pt be transferred to another hospital. [name] informed him that inpatient tx was being sought elsewhere.    Collateral Contacts Shamiya Demeritt, father: [phone] (home); [phone] (cell)  Malka So, friend/neighbor--[phone] [place] and [place], friends/neighbors--[phone]  [facility] Clinical Social Worker [facility] phone: [phone]

## 2015-07-31 NOTE — ED Notes (Signed)
Patient in room walking around.  Safety checks maintained through q 15 min checks and camera monitoring. Stretcher in lowest position in room. Call bell within reach. Will continue to monitor.  
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
## 2015-07-31 NOTE — ED Notes (Signed)
Patient is tearful and upset at this time because she was told that she cannot leave. Stretcher in lowest position and call bell within reach, and call bell within reach. Safety measures are maintained through q 15 min checks and camera monitoring. Will continue to monitor.

## 2015-07-31 NOTE — ED Notes (Addendum)
Report received from [facility]. Pt. Alert and oriented x 4, in no distress  At this time. Pt denies SI, HI, AVH. Pt complained of mild pain from sore throat.  Pt. Instructed to come to me with problems or concerns.Will continue to monitor for safety via security cameras and Q 15 minute checks.

## 2015-07-31 NOTE — ED Notes (Addendum)
Patient walking around in room.  Safety checks maintained through q 15 min checks and camera monitoring. Stretcher in lowest position in room. Call bell within reach. Will continue to monitor. Denies SI, HI, AVH.
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
## 2015-07-31 NOTE — ED Notes (Signed)
Patient walking in room.  Safety checks maintained through q 15 min checks and camera monitoring. Stretcher in lowest position in room. Call bell within reach. Will continue to monitor.

## 2015-07-31 NOTE — ED Notes (Signed)
Patient on telephone in hallway.  Safety checks maintained through q 15 min checks and camera monitoring. Stretcher in lowest position in room. Call bell within reach. Will continue to monitor.  

## 2015-07-31 NOTE — ED Notes (Signed)
Patient in room walking around. Stretcher in lowest position and call bell within reach. Safety measures are in place through q 15 min checks and camera monitoring. Will continue to monitor.
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
## 2015-07-31 NOTE — Progress Notes (Signed)
Disposition [name] faxed patient referrals to the following inpatient psych facilities:  [facility]  [name] will continue to assist with placement needs.  [facility] Disposition [name] [phone]

## 2015-08-01 DIAGNOSIS — Z9889 Other specified postprocedural states: Secondary | ICD-10-CM | POA: Diagnosis not present

## 2015-08-01 DIAGNOSIS — Z886 Allergy status to analgesic agent status: Secondary | ICD-10-CM | POA: Diagnosis not present

## 2015-08-01 DIAGNOSIS — F419 Anxiety disorder, unspecified: Secondary | ICD-10-CM | POA: Diagnosis present

## 2015-08-01 DIAGNOSIS — F22 Delusional disorders: Secondary | ICD-10-CM | POA: Diagnosis not present

## 2015-08-01 DIAGNOSIS — F322 Major depressive disorder, single episode, severe without psychotic features: Secondary | ICD-10-CM | POA: Diagnosis not present

## 2015-08-01 DIAGNOSIS — Z91011 Allergy to milk products: Secondary | ICD-10-CM | POA: Diagnosis not present

## 2015-08-01 DIAGNOSIS — Z881 Allergy status to other antibiotic agents status: Secondary | ICD-10-CM | POA: Diagnosis not present

## 2015-08-01 DIAGNOSIS — Z79899 Other long term (current) drug therapy: Secondary | ICD-10-CM | POA: Diagnosis not present

## 2015-08-01 DIAGNOSIS — Z888 Allergy status to other drugs, medicaments and biological substances status: Secondary | ICD-10-CM | POA: Diagnosis not present

## 2015-08-01 DIAGNOSIS — G8929 Other chronic pain: Secondary | ICD-10-CM | POA: Diagnosis not present

## 2015-08-01 DIAGNOSIS — F29 Unspecified psychosis not due to a substance or known physiological condition: Secondary | ICD-10-CM | POA: Diagnosis not present

## 2015-08-01 DIAGNOSIS — Z62810 Personal history of physical and sexual abuse in childhood: Secondary | ICD-10-CM | POA: Diagnosis present

## 2015-08-01 DIAGNOSIS — F312 Bipolar disorder, current episode manic severe with psychotic features: Secondary | ICD-10-CM | POA: Diagnosis not present

## 2015-08-01 DIAGNOSIS — Z3202 Encounter for pregnancy test, result negative: Secondary | ICD-10-CM | POA: Diagnosis not present

## 2015-08-01 DIAGNOSIS — F4310 Post-traumatic stress disorder, unspecified: Secondary | ICD-10-CM | POA: Diagnosis present

## 2015-08-01 DIAGNOSIS — F332 Major depressive disorder, recurrent severe without psychotic features: Secondary | ICD-10-CM | POA: Diagnosis not present

## 2015-08-01 DIAGNOSIS — F42 Obsessive-compulsive disorder: Secondary | ICD-10-CM | POA: Diagnosis present

## 2015-08-01 MED ORDER — GUAIFENESIN ER 600 MG PO TB12
600.0000 mg | ORAL_TABLET | Freq: Two times a day (BID) | ORAL | Status: DC
  Filled 2015-08-01 (×2): qty 1

## 2015-08-01 MED ORDER — TRAZODONE HCL 50 MG PO TABS: 50.0000 mg | ORAL_TABLET | Freq: Every evening | ORAL | Status: DC | PRN

## 2015-08-01 MED ORDER — QUETIAPINE FUMARATE 50 MG PO TABS: 50.0000 mg | ORAL_TABLET | Freq: Every day | ORAL | Status: DC
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
## 2015-08-01 NOTE — BH Assessment (Signed)
[facility] Assessment Progress Note  The following facilities have been contacted to seek placement for this pt, with results as noted:  Beds available, information sent, decision pending:  [facility] [place] [place]   At capacity:  [facility]    Doylene Canning, [place] Triage Specialist [phone]
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
## 2015-08-01 NOTE — ED Notes (Signed)
Patient medication left in pharmacy. Will remind pharmacy that patient has been discharged to [facility] to hold medication for patient to pick up after she'd discharged from [facility].
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
## 2015-08-01 NOTE — ED Notes (Signed)
Patient denies SI, HI and AVH at this time. Plan of care discussed. Patient voices no complaints or concerns at this time. Encouragement and support provided and safety maintain. Q 15 min safety checks remain in place

## 2015-08-01 NOTE — ED Provider Notes (Signed)
Complains of diffuse parathoracic back pain for several days worse with changing positions improved with standing no shortness of breath no abdominal pain. She feels as if the bed is uncomfortable. On exam no distress lungs clear to auscultation pain is exacerbated when she sits up from a supine position. Pain is felt to be musculoskeletal in etiology. Suggest warm compresses or cold packs  Doug Sou, MD 08/01/15 1527

## 2015-08-01 NOTE — ED Notes (Signed)
Patient angry and upset that she was not seen by a provider today.  States she needs her medical needs addressed.  Complains of pain in her back and believes she is going to have a yeast infection.  She wants to know who her parents can call to complain to and to advocate for her.  I have done my best to be an advocate for her since early this morning.  She believes her rights have been violated and that the staff is acting in an unethical manner.  She requested an ethics consult which was called.  Patient was updated again about the fact that she was to see a counselor today and not a doctor.  She was also updated about the ethics committee being notified and that I was given verbal OK to call the EDP to evaluate her back pain.  Patient was appreciative of the information.

## 2015-08-01 NOTE — ED Notes (Signed)
Patient transported to [place], by [facility], for continuation of specialized care. Belongings signed for and given to deputy. Pt left in no acute distress.

## 2015-08-09 DIAGNOSIS — Z0471 Encounter for examination and observation following alleged adult physical abuse: Secondary | ICD-10-CM | POA: Diagnosis not present

## 2015-08-09 DIAGNOSIS — Z113 Encounter for screening for infections with a predominantly sexual mode of transmission: Secondary | ICD-10-CM | POA: Diagnosis not present

## 2015-08-09 DIAGNOSIS — Z0441 Encounter for examination and observation following alleged adult rape: Secondary | ICD-10-CM | POA: Diagnosis not present

## 2015-08-10 DIAGNOSIS — R311 Benign essential microscopic hematuria: Secondary | ICD-10-CM | POA: Diagnosis not present

## 2015-08-10 DIAGNOSIS — Z113 Encounter for screening for infections with a predominantly sexual mode of transmission: Secondary | ICD-10-CM | POA: Diagnosis not present

## 2015-08-11 DIAGNOSIS — Z79899 Other long term (current) drug therapy: Secondary | ICD-10-CM | POA: Diagnosis not present

## 2015-08-23 DIAGNOSIS — M2550 Pain in unspecified joint: Secondary | ICD-10-CM | POA: Diagnosis not present

## 2015-08-23 DIAGNOSIS — Z79899 Other long term (current) drug therapy: Secondary | ICD-10-CM | POA: Diagnosis not present

## 2015-08-28 DIAGNOSIS — Z5321 Procedure and treatment not carried out due to patient leaving prior to being seen by health care provider: Secondary | ICD-10-CM | POA: Diagnosis not present

## 2015-08-28 DIAGNOSIS — R531 Weakness: Secondary | ICD-10-CM | POA: Diagnosis not present

## 2015-08-28 DIAGNOSIS — R110 Nausea: Secondary | ICD-10-CM | POA: Diagnosis not present

## 2015-08-28 DIAGNOSIS — R358 Other polyuria: Secondary | ICD-10-CM | POA: Diagnosis not present

## 2015-08-29 ENCOUNTER — Other Ambulatory Visit: Payer: Self-pay | Admitting: Geriatric Medicine

## 2015-08-29 ENCOUNTER — Ambulatory Visit
Admission: RE | Admit: 2015-08-29 | Discharge: 2015-08-29 | Disposition: A | Discharge status: Home / Self Care | Payer: Medicare Other | Source: Ambulatory Visit | Attending: Geriatric Medicine | Admitting: Geriatric Medicine

## 2015-08-29 DIAGNOSIS — M79662 Pain in left lower leg: Secondary | ICD-10-CM | POA: Diagnosis not present

## 2015-08-29 DIAGNOSIS — M79672 Pain in left foot: Secondary | ICD-10-CM

## 2015-08-29 DIAGNOSIS — E871 Hypo-osmolality and hyponatremia: Secondary | ICD-10-CM | POA: Diagnosis not present

## 2015-09-12 DIAGNOSIS — Z01419 Encounter for gynecological examination (general) (routine) without abnormal findings: Secondary | ICD-10-CM | POA: Diagnosis not present

## 2015-09-12 DIAGNOSIS — M810 Age-related osteoporosis without current pathological fracture: Secondary | ICD-10-CM | POA: Diagnosis not present

## 2015-09-12 DIAGNOSIS — Z6821 Body mass index (BMI) 21.0-21.9, adult: Secondary | ICD-10-CM | POA: Diagnosis not present

## 2015-09-12 DIAGNOSIS — Z79890 Hormone replacement therapy (postmenopausal): Secondary | ICD-10-CM | POA: Diagnosis not present

## 2015-09-13 DIAGNOSIS — M797 Fibromyalgia: Secondary | ICD-10-CM | POA: Diagnosis not present

## 2015-09-27 DIAGNOSIS — B373 Candidiasis of vulva and vagina: Secondary | ICD-10-CM | POA: Diagnosis not present

## 2015-09-27 DIAGNOSIS — R300 Dysuria: Secondary | ICD-10-CM | POA: Diagnosis not present

## 2015-09-30 DIAGNOSIS — G4700 Insomnia, unspecified: Secondary | ICD-10-CM | POA: Diagnosis not present

## 2015-09-30 DIAGNOSIS — F411 Generalized anxiety disorder: Secondary | ICD-10-CM | POA: Diagnosis not present

## 2015-09-30 DIAGNOSIS — M329 Systemic lupus erythematosus, unspecified: Secondary | ICD-10-CM | POA: Diagnosis not present

## 2015-09-30 DIAGNOSIS — Z23 Encounter for immunization: Secondary | ICD-10-CM | POA: Diagnosis not present

## 2015-10-03 DIAGNOSIS — F339 Major depressive disorder, recurrent, unspecified: Secondary | ICD-10-CM | POA: Diagnosis not present

## 2015-10-03 DIAGNOSIS — L930 Discoid lupus erythematosus: Secondary | ICD-10-CM | POA: Diagnosis not present

## 2015-10-03 DIAGNOSIS — F419 Anxiety disorder, unspecified: Secondary | ICD-10-CM | POA: Diagnosis not present

## 2015-10-03 DIAGNOSIS — R102 Pelvic and perineal pain: Secondary | ICD-10-CM | POA: Diagnosis not present

## 2015-10-03 DIAGNOSIS — R5383 Other fatigue: Secondary | ICD-10-CM | POA: Diagnosis not present

## 2015-10-12 DIAGNOSIS — N760 Acute vaginitis: Secondary | ICD-10-CM | POA: Diagnosis not present

## 2015-10-12 DIAGNOSIS — R102 Pelvic and perineal pain: Secondary | ICD-10-CM | POA: Diagnosis not present

## 2015-10-12 DIAGNOSIS — N898 Other specified noninflammatory disorders of vagina: Secondary | ICD-10-CM | POA: Diagnosis not present

## 2015-10-18 DIAGNOSIS — G4700 Insomnia, unspecified: Secondary | ICD-10-CM | POA: Diagnosis not present

## 2015-10-18 DIAGNOSIS — K5900 Constipation, unspecified: Secondary | ICD-10-CM | POA: Diagnosis not present

## 2015-10-22 DIAGNOSIS — J0100 Acute maxillary sinusitis, unspecified: Secondary | ICD-10-CM | POA: Diagnosis not present

## 2015-11-28 DIAGNOSIS — K900 Celiac disease: Secondary | ICD-10-CM | POA: Diagnosis not present

## 2015-11-28 DIAGNOSIS — K5900 Constipation, unspecified: Secondary | ICD-10-CM | POA: Diagnosis not present

## 2015-11-29 ENCOUNTER — Other Ambulatory Visit: Payer: Self-pay | Admitting: Nurse Practitioner

## 2015-11-29 ENCOUNTER — Ambulatory Visit
Admission: RE | Admit: 2015-11-29 | Discharge: 2015-11-29 | Disposition: A | Discharge status: Home / Self Care | Payer: Medicare Other | Source: Ambulatory Visit | Attending: Nurse Practitioner | Admitting: Nurse Practitioner

## 2015-11-29 DIAGNOSIS — K5909 Other constipation: Secondary | ICD-10-CM

## 2015-11-29 DIAGNOSIS — K5900 Constipation, unspecified: Secondary | ICD-10-CM | POA: Diagnosis not present

## 2015-11-30 DIAGNOSIS — E871 Hypo-osmolality and hyponatremia: Secondary | ICD-10-CM | POA: Diagnosis not present

## 2015-11-30 DIAGNOSIS — K900 Celiac disease: Secondary | ICD-10-CM | POA: Diagnosis not present

## 2015-12-05 DIAGNOSIS — R5383 Other fatigue: Secondary | ICD-10-CM | POA: Diagnosis not present

## 2015-12-05 DIAGNOSIS — M069 Rheumatoid arthritis, unspecified: Secondary | ICD-10-CM | POA: Diagnosis not present

## 2015-12-05 DIAGNOSIS — K5900 Constipation, unspecified: Secondary | ICD-10-CM | POA: Diagnosis not present

## 2015-12-14 DIAGNOSIS — H6980 Other specified disorders of Eustachian tube, unspecified ear: Secondary | ICD-10-CM | POA: Diagnosis not present

## 2015-12-14 DIAGNOSIS — J069 Acute upper respiratory infection, unspecified: Secondary | ICD-10-CM | POA: Diagnosis not present

## 2015-12-30 ENCOUNTER — Encounter: Payer: Self-pay | Admitting: Gastroenterology

## 2016-01-02 DIAGNOSIS — H04129 Dry eye syndrome of unspecified lacrimal gland: Secondary | ICD-10-CM | POA: Diagnosis not present

## 2016-01-02 DIAGNOSIS — H16149 Punctate keratitis, unspecified eye: Secondary | ICD-10-CM | POA: Diagnosis not present

## 2016-01-09 DIAGNOSIS — R760 Raised antibody titer: Secondary | ICD-10-CM | POA: Diagnosis not present

## 2016-01-09 DIAGNOSIS — M797 Fibromyalgia: Secondary | ICD-10-CM | POA: Diagnosis not present

## 2016-01-24 DIAGNOSIS — K5901 Slow transit constipation: Secondary | ICD-10-CM | POA: Diagnosis not present

## 2016-01-30 DIAGNOSIS — H40013 Open angle with borderline findings, low risk, bilateral: Secondary | ICD-10-CM | POA: Diagnosis not present

## 2016-01-30 DIAGNOSIS — H04123 Dry eye syndrome of bilateral lacrimal glands: Secondary | ICD-10-CM | POA: Diagnosis not present

## 2016-01-30 DIAGNOSIS — M329 Systemic lupus erythematosus, unspecified: Secondary | ICD-10-CM | POA: Diagnosis not present

## 2016-01-30 DIAGNOSIS — H53032 Strabismic amblyopia, left eye: Secondary | ICD-10-CM | POA: Diagnosis not present

## 2016-01-30 DIAGNOSIS — Z79899 Other long term (current) drug therapy: Secondary | ICD-10-CM | POA: Diagnosis not present

## 2016-02-28 DIAGNOSIS — M62838 Other muscle spasm: Secondary | ICD-10-CM | POA: Diagnosis not present

## 2016-02-28 DIAGNOSIS — G8929 Other chronic pain: Secondary | ICD-10-CM | POA: Diagnosis not present

## 2016-02-28 DIAGNOSIS — N898 Other specified noninflammatory disorders of vagina: Secondary | ICD-10-CM | POA: Diagnosis not present

## 2016-02-28 DIAGNOSIS — R102 Pelvic and perineal pain: Secondary | ICD-10-CM | POA: Diagnosis not present

## 2016-02-28 DIAGNOSIS — G890 Central pain syndrome: Secondary | ICD-10-CM | POA: Diagnosis not present

## 2016-02-28 DIAGNOSIS — N9419 Other specified dyspareunia: Secondary | ICD-10-CM | POA: Diagnosis not present

## 2016-03-05 IMAGING — MG MM DIGITAL DIAGNOSTIC BILAT CAD
6 series · 6 of 6 positions shown · non-contrast
Comparison: None

CLINICAL DATA: Bilateral milky nipple discharge which is
predominantly with expression but is occasionally spontaneous.
Family history of breast cancer in a maternal aunt and maternal
grandmother.

EXAM:
DIGITAL DIAGNOSTIC  bilateral MAMMOGRAM WITH CAD

[R MLO (1 of 2)]
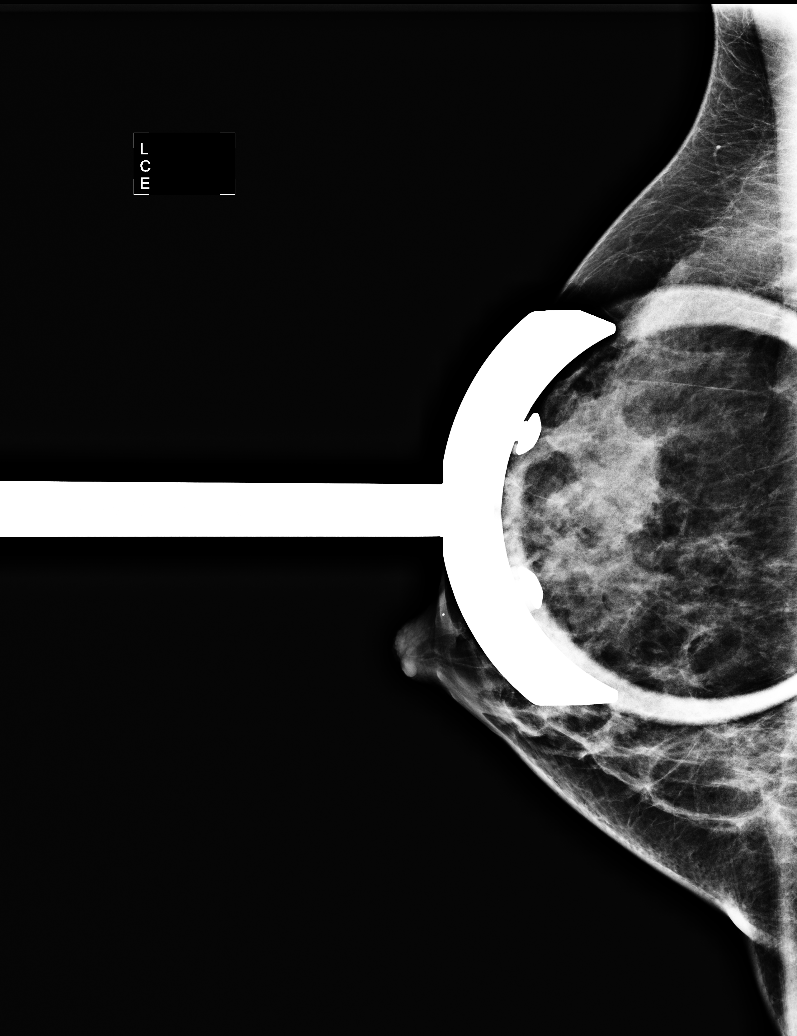

[R ML]
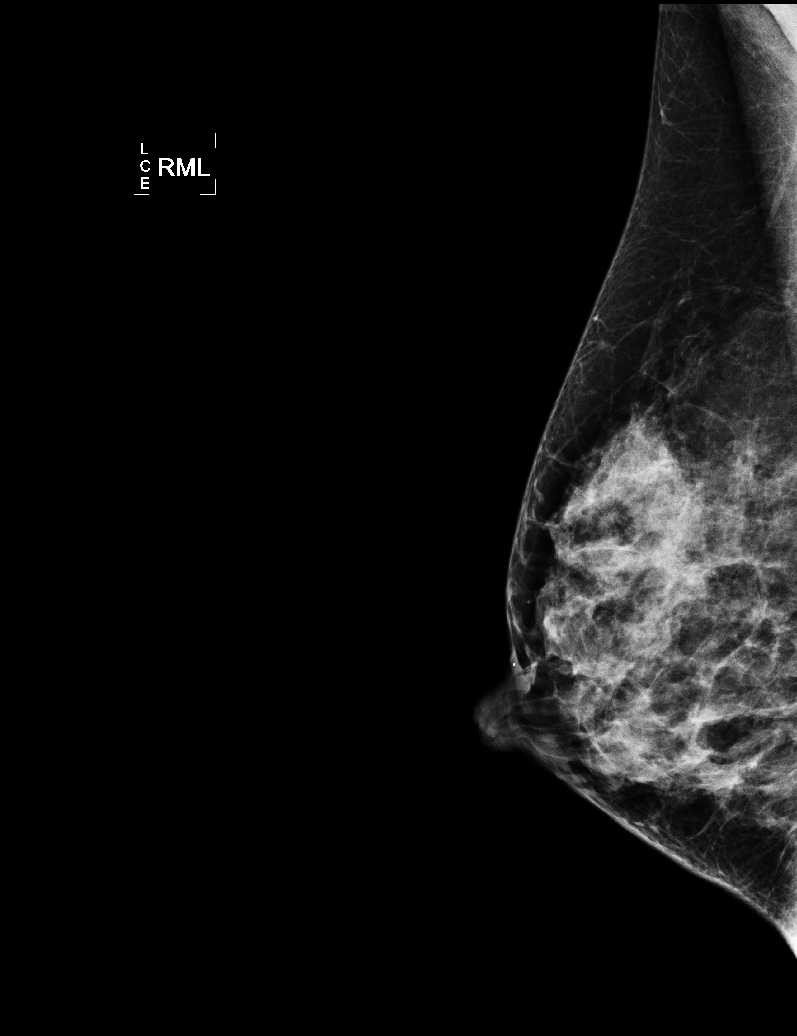

[R MLO (2 of 2)]
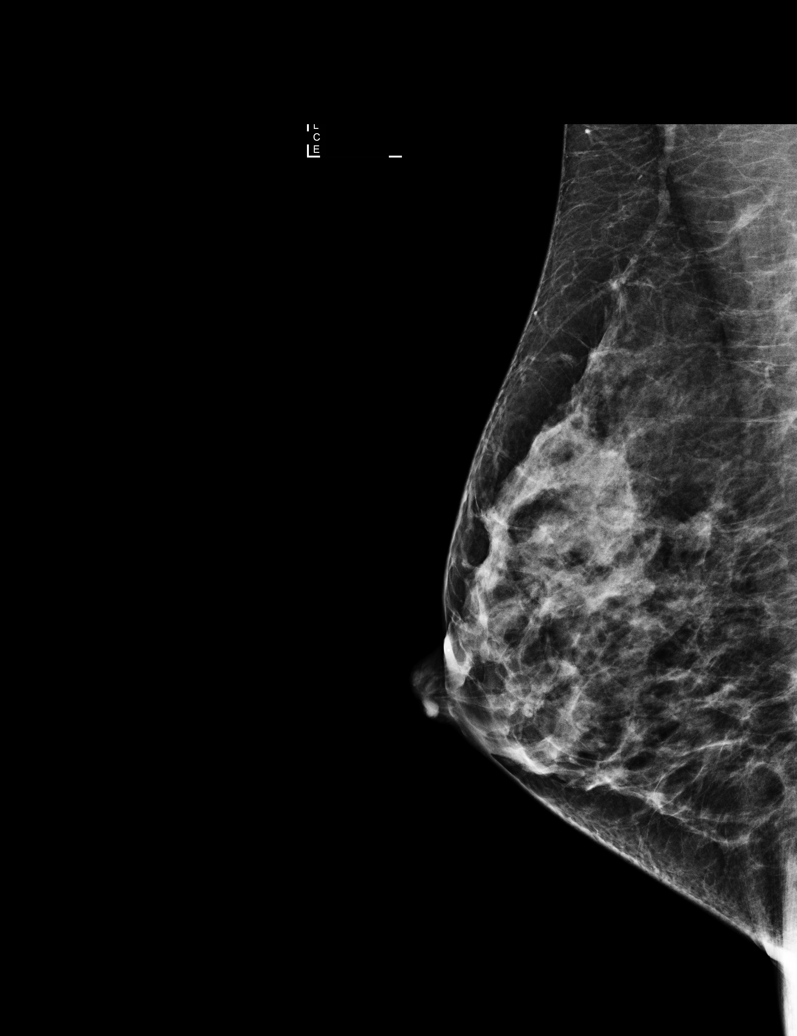

[R CC]
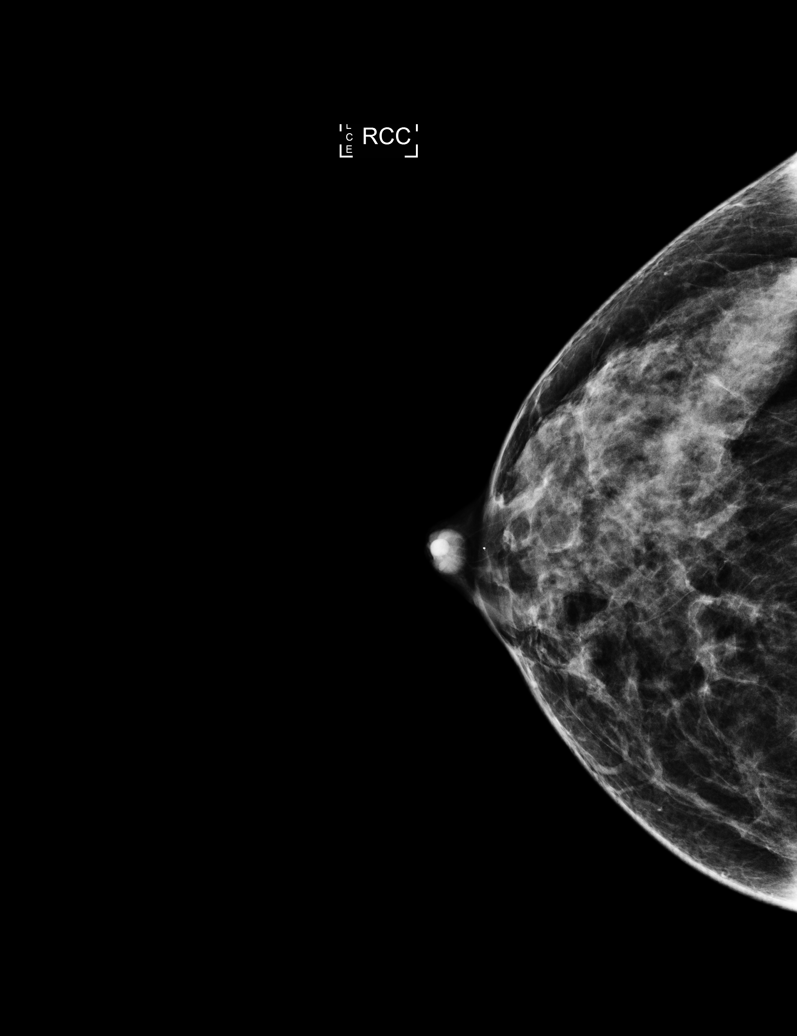

[L CC]
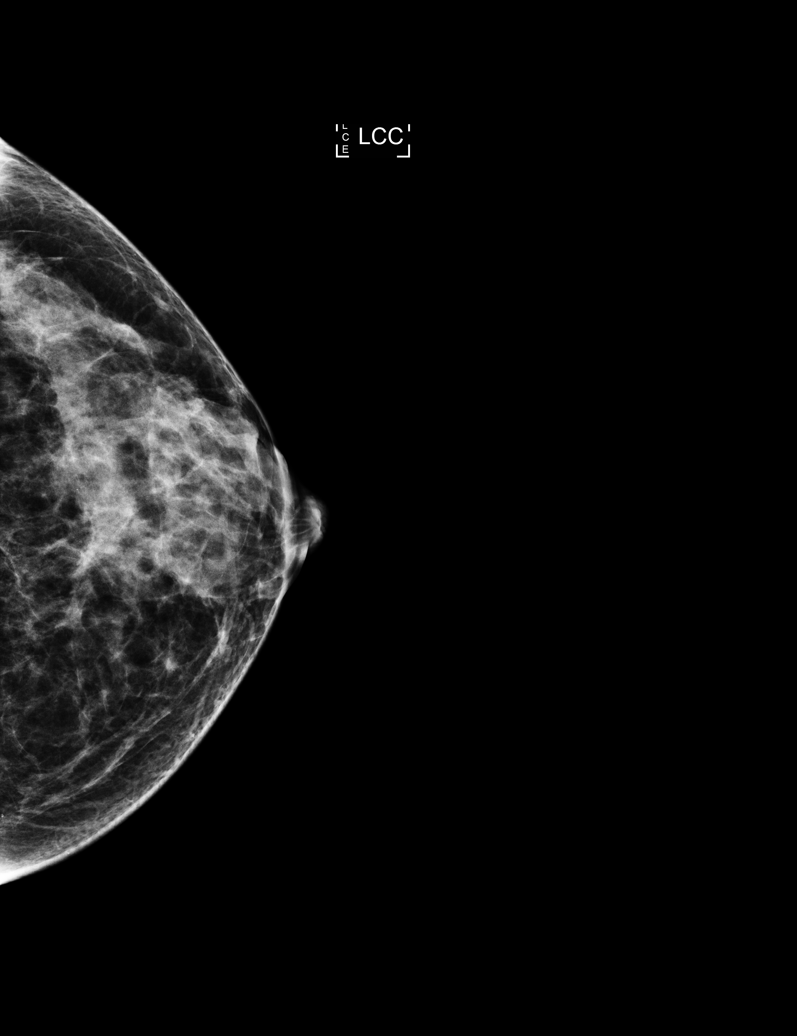

[L MLO]
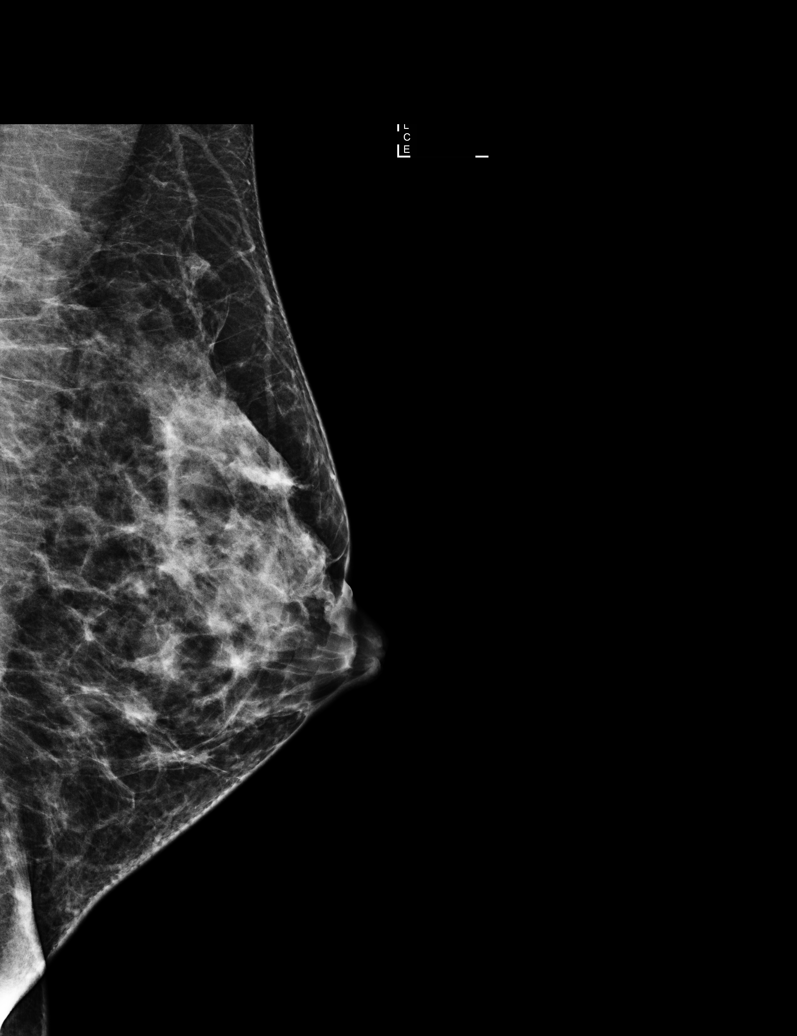

[6 of 6 positions shown; findings below may reference images not displayed]

ACR Breast Density Category c: The breast tissue is heterogeneously
dense, which may obscure small masses.
FINDINGS: There is no mass, distortion, or worrisome calcification within
either breast.

On my directed physical examination of the breasts there is multiple
duct bilateral nonhemorrhagic milky nipple discharge present. There
is no palpable subareolar mass.

Mammographic images were processed with CAD.
IMPRESSION: Bilateral multiple duct nipple discharge is likely on a hormonal
basis. No findings worrisome for malignancy. The findings were
discussed with the patient. Recommend screening mammography at age
39. Breast self-examination was reviewed with the patient.

RECOMMENDATION:
Bilateral screening mammography at age 39.

I have discussed the findings and recommendations with the patient.
Results were also provided in writing at the conclusion of the
visit. If applicable, a reminder letter will be sent to the patient
regarding the next appointment.

BI-RADS CATEGORY  1: Negative.

## 2016-03-08 DIAGNOSIS — J069 Acute upper respiratory infection, unspecified: Secondary | ICD-10-CM | POA: Diagnosis not present

## 2016-04-04 DIAGNOSIS — K589 Irritable bowel syndrome without diarrhea: Secondary | ICD-10-CM | POA: Diagnosis not present

## 2016-04-04 DIAGNOSIS — M545 Low back pain: Secondary | ICD-10-CM | POA: Diagnosis not present

## 2016-04-04 DIAGNOSIS — M25562 Pain in left knee: Secondary | ICD-10-CM | POA: Diagnosis not present

## 2016-04-04 DIAGNOSIS — E871 Hypo-osmolality and hyponatremia: Secondary | ICD-10-CM | POA: Diagnosis not present

## 2016-04-05 DIAGNOSIS — R140 Abdominal distension (gaseous): Secondary | ICD-10-CM | POA: Diagnosis not present

## 2016-04-05 DIAGNOSIS — K5900 Constipation, unspecified: Secondary | ICD-10-CM | POA: Diagnosis not present

## 2016-04-07 DIAGNOSIS — J0100 Acute maxillary sinusitis, unspecified: Secondary | ICD-10-CM | POA: Diagnosis not present

## 2016-04-11 ENCOUNTER — Ambulatory Visit: Payer: Medicare Other | Admitting: Physical Therapy

## 2016-04-16 ENCOUNTER — Ambulatory Visit
Admission: RE | Admit: 2016-04-16 | Discharge: 2016-04-16 | Disposition: A | Discharge status: Home / Self Care | Payer: Medicare Other | Source: Ambulatory Visit | Attending: Gastroenterology | Admitting: Gastroenterology

## 2016-04-16 ENCOUNTER — Other Ambulatory Visit: Payer: Self-pay | Admitting: Gastroenterology

## 2016-04-16 DIAGNOSIS — K5900 Constipation, unspecified: Secondary | ICD-10-CM | POA: Diagnosis not present

## 2016-04-16 DIAGNOSIS — K5901 Slow transit constipation: Secondary | ICD-10-CM

## 2016-04-18 ENCOUNTER — Other Ambulatory Visit: Payer: Self-pay | Admitting: Gastroenterology

## 2016-04-18 ENCOUNTER — Ambulatory Visit
Admission: RE | Admit: 2016-04-18 | Discharge: 2016-04-18 | Disposition: A | Discharge status: Home / Self Care | Payer: Medicare Other | Source: Ambulatory Visit | Attending: Gastroenterology | Admitting: Gastroenterology

## 2016-04-18 ENCOUNTER — Ambulatory Visit: Payer: Medicare Other | Attending: Physical Therapy | Admitting: Physical Therapy

## 2016-04-18 DIAGNOSIS — K5901 Slow transit constipation: Secondary | ICD-10-CM

## 2016-04-18 DIAGNOSIS — T189XXA Foreign body of alimentary tract, part unspecified, initial encounter: Secondary | ICD-10-CM | POA: Diagnosis not present

## 2016-04-20 ENCOUNTER — Ambulatory Visit
Admission: RE | Admit: 2016-04-20 | Discharge: 2016-04-20 | Disposition: A | Discharge status: Home / Self Care | Payer: Medicare Other | Source: Ambulatory Visit | Attending: Gastroenterology | Admitting: Gastroenterology

## 2016-04-20 ENCOUNTER — Other Ambulatory Visit: Payer: Self-pay | Admitting: Gastroenterology

## 2016-04-20 DIAGNOSIS — R935 Abnormal findings on diagnostic imaging of other abdominal regions, including retroperitoneum: Secondary | ICD-10-CM | POA: Diagnosis not present

## 2016-04-20 DIAGNOSIS — K5909 Other constipation: Secondary | ICD-10-CM

## 2016-04-26 DIAGNOSIS — M797 Fibromyalgia: Secondary | ICD-10-CM | POA: Diagnosis not present

## 2016-04-26 DIAGNOSIS — R760 Raised antibody titer: Secondary | ICD-10-CM | POA: Diagnosis not present

## 2016-04-26 DIAGNOSIS — R309 Painful micturition, unspecified: Secondary | ICD-10-CM | POA: Diagnosis not present

## 2016-05-02 ENCOUNTER — Encounter (HOSPITAL_COMMUNITY)
Admission: RE | Disposition: A | Discharge status: Home / Self Care | Payer: Self-pay | Source: Ambulatory Visit | Attending: Gastroenterology

## 2016-05-02 ENCOUNTER — Ambulatory Visit (HOSPITAL_COMMUNITY)
Admission: RE | Admit: 2016-05-02 | Discharge: 2016-05-02 | Disposition: A | Discharge status: Home / Self Care | Payer: Medicare Other | Source: Ambulatory Visit | Attending: Gastroenterology | Admitting: Gastroenterology

## 2016-05-02 DIAGNOSIS — K5900 Constipation, unspecified: Secondary | ICD-10-CM | POA: Insufficient documentation

## 2016-05-02 HISTORY — PX: ANAL RECTAL MANOMETRY: SHX6358

## 2016-05-02 SURGERY — MANOMETRY, ANORECTAL

## 2016-05-02 NOTE — Progress Notes (Signed)
Anal manometry done per protocol. Pt tolerated well without complication. Balloon expulsion test done per protocol. Pt was unable to expel balloon within 3 min. Report will be sent to Dr. Hulen Shouts office.

## 2016-05-03 ENCOUNTER — Encounter (HOSPITAL_COMMUNITY): Payer: Self-pay | Admitting: Gastroenterology

## 2016-05-07 DIAGNOSIS — R509 Fever, unspecified: Secondary | ICD-10-CM | POA: Diagnosis not present

## 2016-05-08 DIAGNOSIS — K5900 Constipation, unspecified: Secondary | ICD-10-CM | POA: Diagnosis not present

## 2016-05-21 DIAGNOSIS — R319 Hematuria, unspecified: Secondary | ICD-10-CM | POA: Diagnosis not present

## 2016-05-21 DIAGNOSIS — R509 Fever, unspecified: Secondary | ICD-10-CM | POA: Diagnosis not present

## 2016-05-22 DIAGNOSIS — N760 Acute vaginitis: Secondary | ICD-10-CM | POA: Diagnosis not present

## 2016-05-22 DIAGNOSIS — R350 Frequency of micturition: Secondary | ICD-10-CM | POA: Diagnosis not present

## 2016-05-24 DIAGNOSIS — R140 Abdominal distension (gaseous): Secondary | ICD-10-CM | POA: Diagnosis not present

## 2016-05-24 DIAGNOSIS — K5900 Constipation, unspecified: Secondary | ICD-10-CM | POA: Diagnosis not present

## 2016-06-03 DIAGNOSIS — H9201 Otalgia, right ear: Secondary | ICD-10-CM | POA: Diagnosis not present

## 2016-06-03 DIAGNOSIS — T161XXA Foreign body in right ear, initial encounter: Secondary | ICD-10-CM | POA: Diagnosis not present

## 2016-06-27 DIAGNOSIS — R3129 Other microscopic hematuria: Secondary | ICD-10-CM | POA: Diagnosis not present

## 2016-07-20 DIAGNOSIS — R350 Frequency of micturition: Secondary | ICD-10-CM | POA: Diagnosis not present

## 2016-07-23 DIAGNOSIS — N134 Hydroureter: Secondary | ICD-10-CM | POA: Diagnosis not present

## 2016-07-23 DIAGNOSIS — R1084 Generalized abdominal pain: Secondary | ICD-10-CM | POA: Diagnosis not present

## 2016-07-23 DIAGNOSIS — R3129 Other microscopic hematuria: Secondary | ICD-10-CM | POA: Diagnosis not present

## 2016-07-27 DIAGNOSIS — N200 Calculus of kidney: Secondary | ICD-10-CM | POA: Diagnosis not present

## 2016-07-27 DIAGNOSIS — R3129 Other microscopic hematuria: Secondary | ICD-10-CM | POA: Diagnosis not present

## 2016-08-02 DIAGNOSIS — R3121 Asymptomatic microscopic hematuria: Secondary | ICD-10-CM | POA: Diagnosis not present

## 2016-08-02 DIAGNOSIS — N200 Calculus of kidney: Secondary | ICD-10-CM | POA: Diagnosis not present

## 2016-08-02 DIAGNOSIS — R1030 Lower abdominal pain, unspecified: Secondary | ICD-10-CM | POA: Diagnosis not present

## 2016-08-10 DIAGNOSIS — J0100 Acute maxillary sinusitis, unspecified: Secondary | ICD-10-CM | POA: Diagnosis not present

## 2016-08-10 DIAGNOSIS — J301 Allergic rhinitis due to pollen: Secondary | ICD-10-CM | POA: Diagnosis not present

## 2016-08-17 DIAGNOSIS — M545 Low back pain: Secondary | ICD-10-CM | POA: Diagnosis not present

## 2016-08-17 DIAGNOSIS — N952 Postmenopausal atrophic vaginitis: Secondary | ICD-10-CM | POA: Diagnosis not present

## 2016-08-17 DIAGNOSIS — R102 Pelvic and perineal pain: Secondary | ICD-10-CM | POA: Diagnosis not present

## 2016-08-17 DIAGNOSIS — N898 Other specified noninflammatory disorders of vagina: Secondary | ICD-10-CM | POA: Diagnosis not present

## 2016-08-23 DIAGNOSIS — Z79899 Other long term (current) drug therapy: Secondary | ICD-10-CM | POA: Diagnosis not present

## 2016-08-23 DIAGNOSIS — M329 Systemic lupus erythematosus, unspecified: Secondary | ICD-10-CM | POA: Diagnosis not present

## 2016-08-23 DIAGNOSIS — H04123 Dry eye syndrome of bilateral lacrimal glands: Secondary | ICD-10-CM | POA: Diagnosis not present

## 2016-08-29 DIAGNOSIS — K5900 Constipation, unspecified: Secondary | ICD-10-CM | POA: Diagnosis not present

## 2016-08-29 DIAGNOSIS — R140 Abdominal distension (gaseous): Secondary | ICD-10-CM | POA: Diagnosis not present

## 2016-09-20 DIAGNOSIS — N898 Other specified noninflammatory disorders of vagina: Secondary | ICD-10-CM | POA: Diagnosis not present

## 2016-09-20 DIAGNOSIS — R102 Pelvic and perineal pain: Secondary | ICD-10-CM | POA: Diagnosis not present

## 2016-09-20 DIAGNOSIS — K589 Irritable bowel syndrome without diarrhea: Secondary | ICD-10-CM | POA: Diagnosis not present

## 2016-09-20 DIAGNOSIS — Z01419 Encounter for gynecological examination (general) (routine) without abnormal findings: Secondary | ICD-10-CM | POA: Diagnosis not present

## 2016-09-20 DIAGNOSIS — F339 Major depressive disorder, recurrent, unspecified: Secondary | ICD-10-CM | POA: Diagnosis not present

## 2016-09-20 DIAGNOSIS — M810 Age-related osteoporosis without current pathological fracture: Secondary | ICD-10-CM | POA: Diagnosis not present

## 2016-09-20 DIAGNOSIS — Z6822 Body mass index (BMI) 22.0-22.9, adult: Secondary | ICD-10-CM | POA: Diagnosis not present

## 2016-10-01 DIAGNOSIS — Z79890 Hormone replacement therapy (postmenopausal): Secondary | ICD-10-CM | POA: Diagnosis not present

## 2016-10-01 DIAGNOSIS — Z803 Family history of malignant neoplasm of breast: Secondary | ICD-10-CM | POA: Diagnosis not present

## 2016-10-16 DIAGNOSIS — M797 Fibromyalgia: Secondary | ICD-10-CM | POA: Diagnosis not present

## 2016-10-16 DIAGNOSIS — R760 Raised antibody titer: Secondary | ICD-10-CM | POA: Diagnosis not present

## 2016-11-09 ENCOUNTER — Other Ambulatory Visit: Payer: Self-pay | Admitting: Geriatric Medicine

## 2016-11-09 ENCOUNTER — Ambulatory Visit
Admission: RE | Admit: 2016-11-09 | Discharge: 2016-11-09 | Disposition: A | Discharge status: Home / Self Care | Payer: Medicare Other | Source: Ambulatory Visit | Attending: Geriatric Medicine | Admitting: Geriatric Medicine

## 2016-11-09 DIAGNOSIS — M25562 Pain in left knee: Secondary | ICD-10-CM

## 2016-11-09 DIAGNOSIS — Z79899 Other long term (current) drug therapy: Secondary | ICD-10-CM | POA: Diagnosis not present

## 2016-11-09 DIAGNOSIS — M069 Rheumatoid arthritis, unspecified: Secondary | ICD-10-CM | POA: Diagnosis not present

## 2016-11-11 DIAGNOSIS — J029 Acute pharyngitis, unspecified: Secondary | ICD-10-CM | POA: Diagnosis not present

## 2016-11-15 DIAGNOSIS — H04123 Dry eye syndrome of bilateral lacrimal glands: Secondary | ICD-10-CM | POA: Diagnosis not present

## 2016-12-06 DIAGNOSIS — Z23 Encounter for immunization: Secondary | ICD-10-CM | POA: Diagnosis not present

## 2017-01-29 DIAGNOSIS — R202 Paresthesia of skin: Secondary | ICD-10-CM | POA: Diagnosis not present

## 2017-02-04 DIAGNOSIS — G629 Polyneuropathy, unspecified: Secondary | ICD-10-CM | POA: Diagnosis not present

## 2017-02-04 DIAGNOSIS — G5600 Carpal tunnel syndrome, unspecified upper limb: Secondary | ICD-10-CM | POA: Diagnosis not present

## 2017-02-13 DIAGNOSIS — K589 Irritable bowel syndrome without diarrhea: Secondary | ICD-10-CM | POA: Diagnosis not present

## 2017-02-18 DIAGNOSIS — K5900 Constipation, unspecified: Secondary | ICD-10-CM | POA: Diagnosis not present

## 2017-02-18 DIAGNOSIS — R140 Abdominal distension (gaseous): Secondary | ICD-10-CM | POA: Diagnosis not present

## 2017-02-18 DIAGNOSIS — K900 Celiac disease: Secondary | ICD-10-CM | POA: Diagnosis not present

## 2017-02-21 DIAGNOSIS — M329 Systemic lupus erythematosus, unspecified: Secondary | ICD-10-CM | POA: Diagnosis not present

## 2017-02-21 DIAGNOSIS — H04123 Dry eye syndrome of bilateral lacrimal glands: Secondary | ICD-10-CM | POA: Diagnosis not present

## 2017-02-21 DIAGNOSIS — M3501 Sicca syndrome with keratoconjunctivitis: Secondary | ICD-10-CM | POA: Diagnosis not present

## 2017-02-21 DIAGNOSIS — Z79899 Other long term (current) drug therapy: Secondary | ICD-10-CM | POA: Diagnosis not present

## 2017-03-05 DIAGNOSIS — K921 Melena: Secondary | ICD-10-CM | POA: Diagnosis not present

## 2017-03-05 DIAGNOSIS — R140 Abdominal distension (gaseous): Secondary | ICD-10-CM | POA: Diagnosis not present

## 2017-03-13 DIAGNOSIS — T148XXA Other injury of unspecified body region, initial encounter: Secondary | ICD-10-CM | POA: Diagnosis not present

## 2017-03-13 DIAGNOSIS — M7989 Other specified soft tissue disorders: Secondary | ICD-10-CM | POA: Diagnosis not present

## 2017-03-14 DIAGNOSIS — R202 Paresthesia of skin: Secondary | ICD-10-CM | POA: Diagnosis not present

## 2017-03-28 DIAGNOSIS — G629 Polyneuropathy, unspecified: Secondary | ICD-10-CM | POA: Diagnosis not present

## 2017-04-03 ENCOUNTER — Other Ambulatory Visit: Payer: Self-pay | Admitting: Specialist

## 2017-04-03 ENCOUNTER — Other Ambulatory Visit (HOSPITAL_COMMUNITY): Payer: Self-pay | Admitting: Gastroenterology

## 2017-04-03 DIAGNOSIS — R140 Abdominal distension (gaseous): Secondary | ICD-10-CM

## 2017-04-03 DIAGNOSIS — K921 Melena: Secondary | ICD-10-CM | POA: Diagnosis not present

## 2017-04-03 DIAGNOSIS — K5900 Constipation, unspecified: Secondary | ICD-10-CM | POA: Diagnosis not present

## 2017-04-03 DIAGNOSIS — R198 Other specified symptoms and signs involving the digestive system and abdomen: Secondary | ICD-10-CM

## 2017-04-16 DIAGNOSIS — R760 Raised antibody titer: Secondary | ICD-10-CM | POA: Diagnosis not present

## 2017-04-16 DIAGNOSIS — M797 Fibromyalgia: Secondary | ICD-10-CM | POA: Diagnosis not present

## 2017-04-25 ENCOUNTER — Other Ambulatory Visit: Payer: Self-pay

## 2017-04-29 ENCOUNTER — Ambulatory Visit (HOSPITAL_COMMUNITY): Payer: Medicare Other

## 2017-04-29 ENCOUNTER — Encounter (HOSPITAL_COMMUNITY): Payer: Self-pay

## 2017-06-03 DIAGNOSIS — F419 Anxiety disorder, unspecified: Secondary | ICD-10-CM | POA: Diagnosis not present

## 2017-06-03 DIAGNOSIS — R197 Diarrhea, unspecified: Secondary | ICD-10-CM | POA: Diagnosis not present

## 2017-06-03 DIAGNOSIS — R002 Palpitations: Secondary | ICD-10-CM | POA: Diagnosis not present

## 2017-06-04 ENCOUNTER — Encounter (HOSPITAL_COMMUNITY): Admission: RE | Admit: 2017-06-04 | Payer: Medicare Other | Source: Ambulatory Visit

## 2017-07-31 DIAGNOSIS — N898 Other specified noninflammatory disorders of vagina: Secondary | ICD-10-CM | POA: Diagnosis not present

## 2017-08-27 DIAGNOSIS — M3501 Sicca syndrome with keratoconjunctivitis: Secondary | ICD-10-CM | POA: Diagnosis not present

## 2017-08-27 DIAGNOSIS — H04123 Dry eye syndrome of bilateral lacrimal glands: Secondary | ICD-10-CM | POA: Diagnosis not present

## 2017-08-27 DIAGNOSIS — M329 Systemic lupus erythematosus, unspecified: Secondary | ICD-10-CM | POA: Diagnosis not present

## 2017-08-27 DIAGNOSIS — Z79899 Other long term (current) drug therapy: Secondary | ICD-10-CM | POA: Diagnosis not present

## 2017-09-06 ENCOUNTER — Ambulatory Visit (HOSPITAL_COMMUNITY)
Admission: RE | Admit: 2017-09-06 | Discharge: 2017-09-06 | Disposition: A | Discharge status: Home / Self Care | Payer: Medicare Other | Source: Ambulatory Visit | Attending: Gastroenterology | Admitting: Gastroenterology

## 2017-09-06 DIAGNOSIS — R140 Abdominal distension (gaseous): Secondary | ICD-10-CM

## 2017-09-06 DIAGNOSIS — R198 Other specified symptoms and signs involving the digestive system and abdomen: Secondary | ICD-10-CM

## 2017-09-18 ENCOUNTER — Other Ambulatory Visit: Payer: Self-pay | Admitting: Gastroenterology

## 2017-09-19 ENCOUNTER — Encounter (HOSPITAL_COMMUNITY): Admission: RE | Admit: 2017-09-19 | Payer: Medicare Other | Source: Ambulatory Visit

## 2017-10-08 DIAGNOSIS — M3219 Other organ or system involvement in systemic lupus erythematosus: Secondary | ICD-10-CM | POA: Diagnosis not present

## 2017-10-09 DIAGNOSIS — Z6821 Body mass index (BMI) 21.0-21.9, adult: Secondary | ICD-10-CM | POA: Diagnosis not present

## 2017-10-09 DIAGNOSIS — Z760 Encounter for issue of repeat prescription: Secondary | ICD-10-CM | POA: Diagnosis not present

## 2017-10-09 DIAGNOSIS — Z01419 Encounter for gynecological examination (general) (routine) without abnormal findings: Secondary | ICD-10-CM | POA: Diagnosis not present

## 2017-10-09 DIAGNOSIS — E2839 Other primary ovarian failure: Secondary | ICD-10-CM | POA: Diagnosis not present

## 2017-10-09 DIAGNOSIS — R109 Unspecified abdominal pain: Secondary | ICD-10-CM | POA: Diagnosis not present

## 2017-10-16 ENCOUNTER — Encounter (HOSPITAL_COMMUNITY): Payer: Medicare Other

## 2017-10-16 DIAGNOSIS — J0110 Acute frontal sinusitis, unspecified: Secondary | ICD-10-CM | POA: Diagnosis not present

## 2017-10-18 DIAGNOSIS — Z79899 Other long term (current) drug therapy: Secondary | ICD-10-CM | POA: Diagnosis not present

## 2017-10-22 ENCOUNTER — Encounter (HOSPITAL_COMMUNITY): Admission: RE | Admit: 2017-10-22 | Payer: Medicare Other | Source: Ambulatory Visit

## 2017-10-24 DIAGNOSIS — Z79899 Other long term (current) drug therapy: Secondary | ICD-10-CM | POA: Diagnosis not present

## 2017-12-30 DIAGNOSIS — J301 Allergic rhinitis due to pollen: Secondary | ICD-10-CM | POA: Diagnosis not present

## 2018-01-03 DIAGNOSIS — R509 Fever, unspecified: Secondary | ICD-10-CM | POA: Diagnosis not present

## 2018-01-03 DIAGNOSIS — J0101 Acute recurrent maxillary sinusitis: Secondary | ICD-10-CM | POA: Diagnosis not present

## 2018-01-03 DIAGNOSIS — J111 Influenza due to unidentified influenza virus with other respiratory manifestations: Secondary | ICD-10-CM | POA: Diagnosis not present

## 2018-02-14 DIAGNOSIS — L84 Corns and callosities: Secondary | ICD-10-CM | POA: Diagnosis not present

## 2018-02-14 DIAGNOSIS — B070 Plantar wart: Secondary | ICD-10-CM | POA: Diagnosis not present

## 2018-03-11 DIAGNOSIS — N898 Other specified noninflammatory disorders of vagina: Secondary | ICD-10-CM | POA: Diagnosis not present

## 2018-03-11 DIAGNOSIS — Z113 Encounter for screening for infections with a predominantly sexual mode of transmission: Secondary | ICD-10-CM | POA: Diagnosis not present

## 2018-03-11 DIAGNOSIS — R399 Unspecified symptoms and signs involving the genitourinary system: Secondary | ICD-10-CM | POA: Diagnosis not present

## 2018-03-21 DIAGNOSIS — H04123 Dry eye syndrome of bilateral lacrimal glands: Secondary | ICD-10-CM | POA: Diagnosis not present

## 2018-03-21 DIAGNOSIS — M329 Systemic lupus erythematosus, unspecified: Secondary | ICD-10-CM | POA: Diagnosis not present

## 2018-03-21 DIAGNOSIS — Z79899 Other long term (current) drug therapy: Secondary | ICD-10-CM | POA: Diagnosis not present

## 2018-03-21 DIAGNOSIS — M3501 Sicca syndrome with keratoconjunctivitis: Secondary | ICD-10-CM | POA: Diagnosis not present

## 2018-05-07 DIAGNOSIS — R42 Dizziness and giddiness: Secondary | ICD-10-CM | POA: Diagnosis not present

## 2018-05-07 DIAGNOSIS — K5900 Constipation, unspecified: Secondary | ICD-10-CM | POA: Diagnosis not present

## 2018-05-07 DIAGNOSIS — R5383 Other fatigue: Secondary | ICD-10-CM | POA: Diagnosis not present

## 2018-05-07 DIAGNOSIS — R51 Headache: Secondary | ICD-10-CM | POA: Diagnosis not present

## 2018-05-07 DIAGNOSIS — M2550 Pain in unspecified joint: Secondary | ICD-10-CM | POA: Diagnosis not present

## 2018-05-07 DIAGNOSIS — Z79899 Other long term (current) drug therapy: Secondary | ICD-10-CM | POA: Diagnosis not present

## 2018-05-12 DIAGNOSIS — M3219 Other organ or system involvement in systemic lupus erythematosus: Secondary | ICD-10-CM | POA: Diagnosis not present

## 2018-05-29 DIAGNOSIS — B850 Pediculosis due to Pediculus humanus capitis: Secondary | ICD-10-CM | POA: Diagnosis not present

## 2018-06-19 DIAGNOSIS — G4700 Insomnia, unspecified: Secondary | ICD-10-CM | POA: Diagnosis not present

## 2018-06-21 DIAGNOSIS — R51 Headache: Secondary | ICD-10-CM | POA: Diagnosis not present

## 2018-06-21 DIAGNOSIS — M069 Rheumatoid arthritis, unspecified: Secondary | ICD-10-CM | POA: Diagnosis not present

## 2018-06-21 DIAGNOSIS — F439 Reaction to severe stress, unspecified: Secondary | ICD-10-CM | POA: Diagnosis not present

## 2018-07-11 DIAGNOSIS — F411 Generalized anxiety disorder: Secondary | ICD-10-CM | POA: Diagnosis not present

## 2018-07-11 DIAGNOSIS — G4700 Insomnia, unspecified: Secondary | ICD-10-CM | POA: Diagnosis not present

## 2018-07-11 DIAGNOSIS — Z8639 Personal history of other endocrine, nutritional and metabolic disease: Secondary | ICD-10-CM | POA: Diagnosis not present

## 2018-07-16 DIAGNOSIS — Z803 Family history of malignant neoplasm of breast: Secondary | ICD-10-CM | POA: Diagnosis not present

## 2018-07-16 DIAGNOSIS — N644 Mastodynia: Secondary | ICD-10-CM | POA: Diagnosis not present

## 2018-07-16 DIAGNOSIS — N6452 Nipple discharge: Secondary | ICD-10-CM | POA: Diagnosis not present

## 2018-07-16 DIAGNOSIS — N6459 Other signs and symptoms in breast: Secondary | ICD-10-CM | POA: Diagnosis not present

## 2018-07-24 DIAGNOSIS — N6042 Mammary duct ectasia of left breast: Secondary | ICD-10-CM | POA: Diagnosis not present

## 2018-07-24 DIAGNOSIS — R922 Inconclusive mammogram: Secondary | ICD-10-CM | POA: Diagnosis not present

## 2018-07-24 DIAGNOSIS — N6041 Mammary duct ectasia of right breast: Secondary | ICD-10-CM | POA: Diagnosis not present

## 2018-07-30 ENCOUNTER — Other Ambulatory Visit: Payer: Self-pay | Admitting: Radiology

## 2018-07-30 DIAGNOSIS — N6042 Mammary duct ectasia of left breast: Secondary | ICD-10-CM | POA: Diagnosis not present

## 2018-07-30 DIAGNOSIS — N6323 Unspecified lump in the left breast, lower outer quadrant: Secondary | ICD-10-CM | POA: Diagnosis not present

## 2018-07-30 DIAGNOSIS — N6324 Unspecified lump in the left breast, lower inner quadrant: Secondary | ICD-10-CM | POA: Diagnosis not present

## 2018-08-08 IMAGING — CR DG KNEE 1-2V*L*
2 series · 2 of 2 positions shown · non-contrast
Comparison: None.

CLINICAL DATA: Chronic pain

EXAM:
LEFT KNEE - 1-2 VIEW

[w knee ap left]
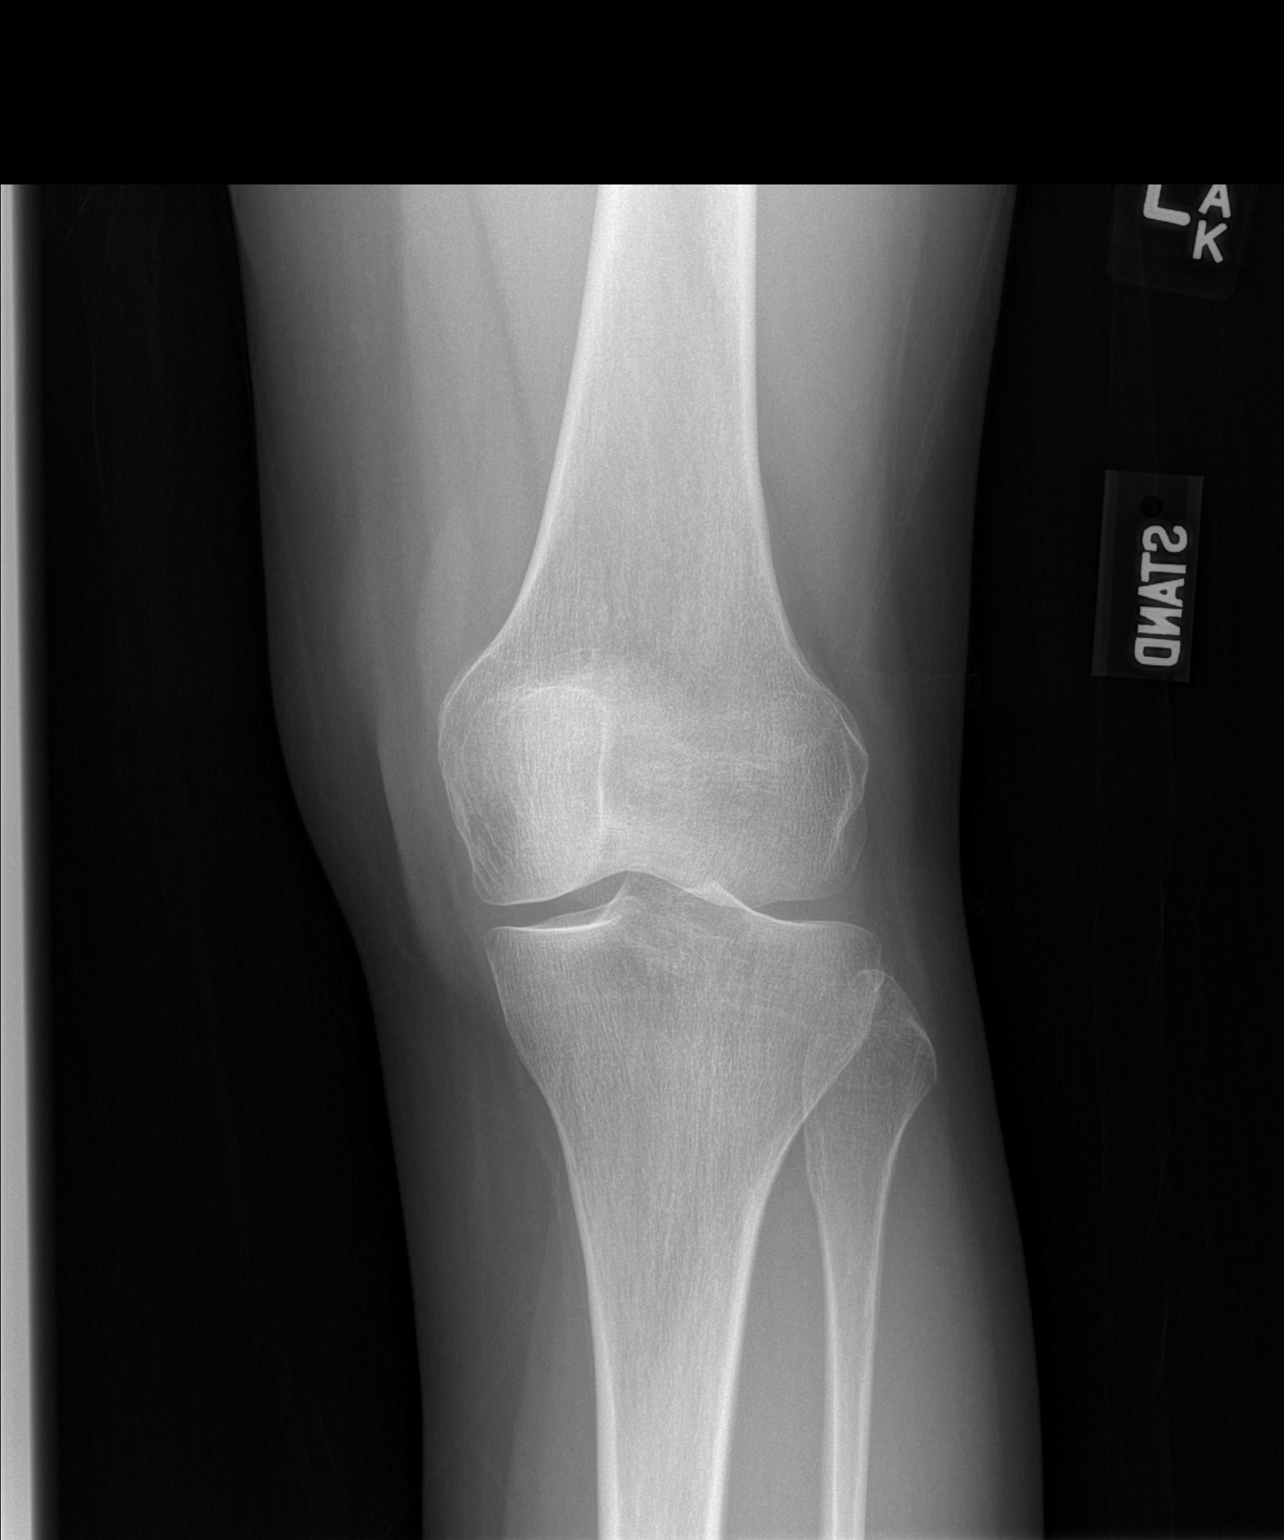

[w knee lat. left]
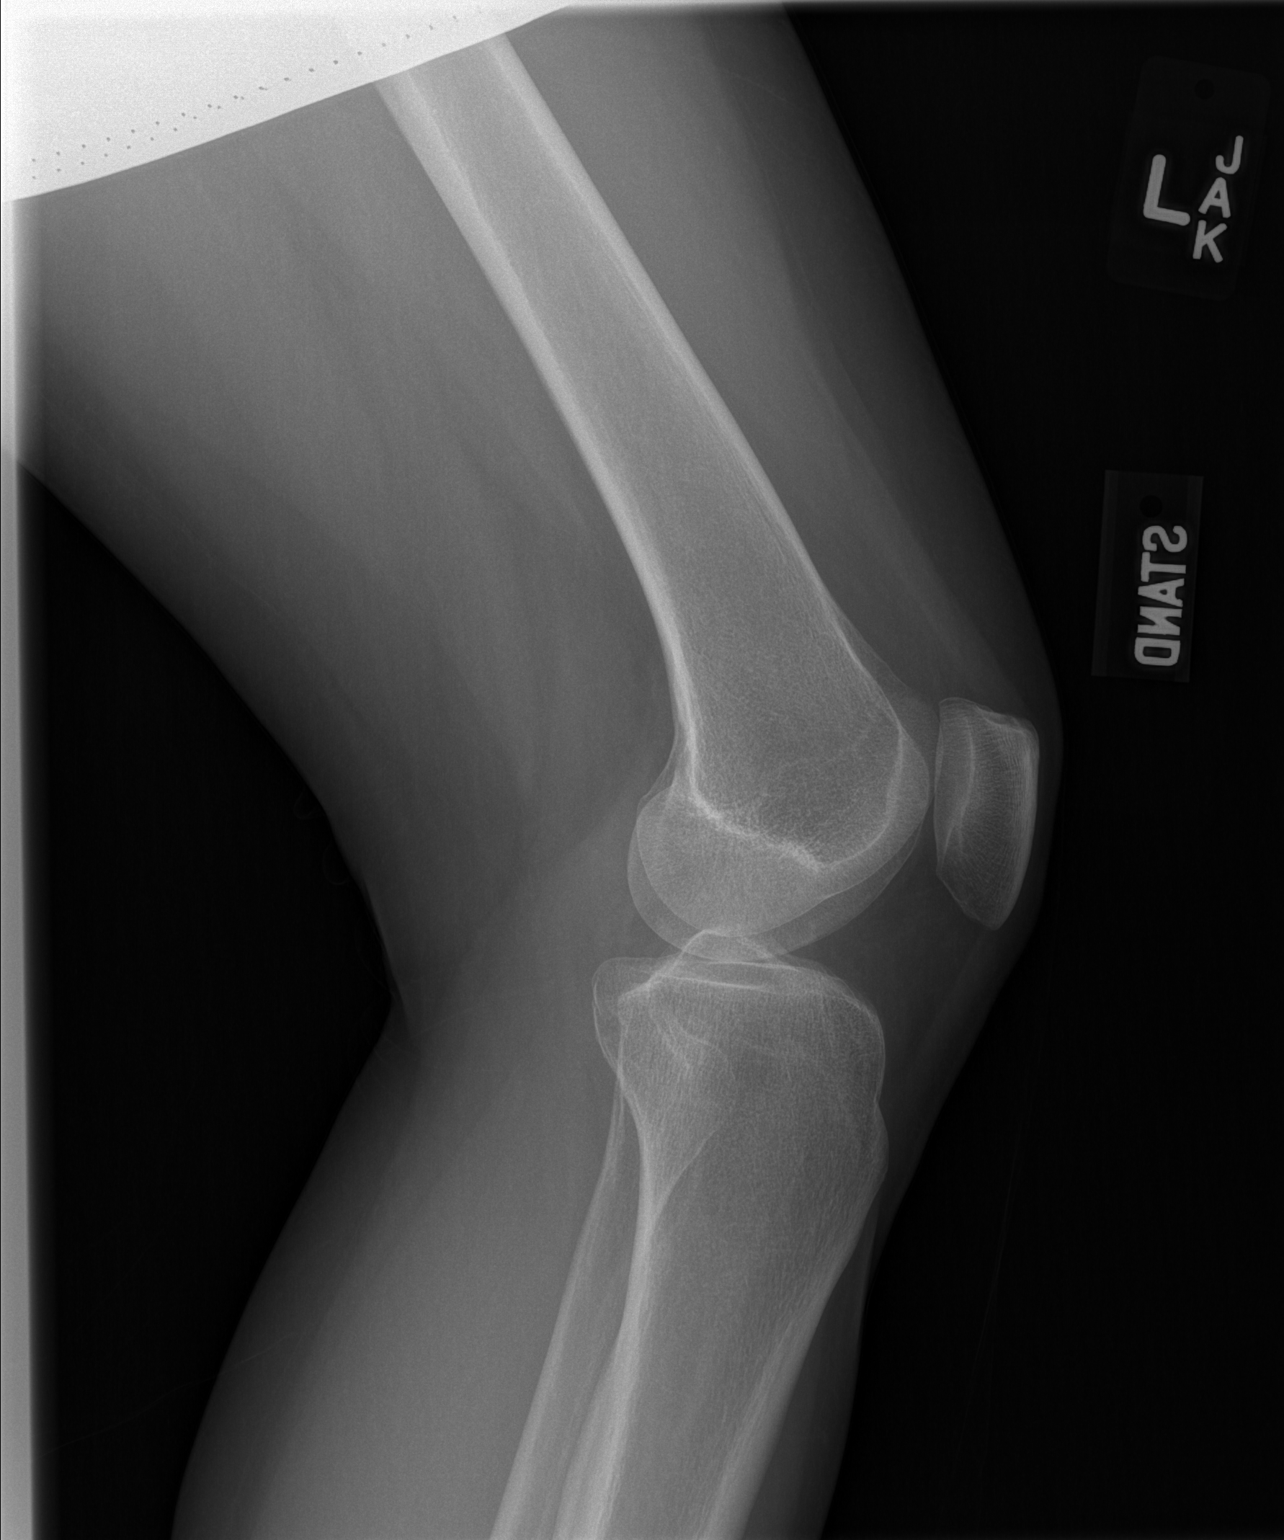

[2 of 2 positions shown; findings below may reference images not displayed]

FINDINGS: Standing frontal and standing lateral views were obtained. No
fracture or dislocation. No joint effusion. The joint spaces appear
normal. No erosive change.
IMPRESSION: No fracture or joint effusion.  No evident arthropathy.

## 2018-08-22 DIAGNOSIS — N644 Mastodynia: Secondary | ICD-10-CM | POA: Diagnosis not present

## 2018-08-23 DIAGNOSIS — N6320 Unspecified lump in the left breast, unspecified quadrant: Secondary | ICD-10-CM | POA: Diagnosis not present

## 2018-08-28 DIAGNOSIS — N6320 Unspecified lump in the left breast, unspecified quadrant: Secondary | ICD-10-CM | POA: Diagnosis not present

## 2018-08-29 DIAGNOSIS — K521 Toxic gastroenteritis and colitis: Secondary | ICD-10-CM | POA: Diagnosis not present

## 2018-08-29 DIAGNOSIS — R928 Other abnormal and inconclusive findings on diagnostic imaging of breast: Secondary | ICD-10-CM | POA: Diagnosis not present

## 2018-09-03 DIAGNOSIS — N6459 Other signs and symptoms in breast: Secondary | ICD-10-CM | POA: Diagnosis not present

## 2018-09-04 ENCOUNTER — Other Ambulatory Visit: Payer: Self-pay | Admitting: General Surgery

## 2018-09-04 DIAGNOSIS — N6459 Other signs and symptoms in breast: Secondary | ICD-10-CM

## 2018-09-07 DIAGNOSIS — J0100 Acute maxillary sinusitis, unspecified: Secondary | ICD-10-CM | POA: Diagnosis not present

## 2018-09-08 DIAGNOSIS — J0100 Acute maxillary sinusitis, unspecified: Secondary | ICD-10-CM | POA: Diagnosis not present

## 2018-09-08 DIAGNOSIS — R509 Fever, unspecified: Secondary | ICD-10-CM | POA: Diagnosis not present

## 2018-09-08 DIAGNOSIS — R102 Pelvic and perineal pain: Secondary | ICD-10-CM | POA: Diagnosis not present

## 2018-09-19 ENCOUNTER — Other Ambulatory Visit: Payer: Self-pay

## 2018-09-26 DIAGNOSIS — F439 Reaction to severe stress, unspecified: Secondary | ICD-10-CM | POA: Diagnosis not present

## 2018-09-26 DIAGNOSIS — G4700 Insomnia, unspecified: Secondary | ICD-10-CM | POA: Diagnosis not present

## 2018-09-26 DIAGNOSIS — Z8639 Personal history of other endocrine, nutritional and metabolic disease: Secondary | ICD-10-CM | POA: Diagnosis not present

## 2018-09-26 DIAGNOSIS — Z23 Encounter for immunization: Secondary | ICD-10-CM | POA: Diagnosis not present

## 2018-10-14 ENCOUNTER — Ambulatory Visit
Admission: RE | Admit: 2018-10-14 | Discharge: 2018-10-14 | Disposition: A | Discharge status: Home / Self Care | Payer: Medicare Other | Source: Ambulatory Visit | Attending: General Surgery | Admitting: General Surgery

## 2018-10-14 DIAGNOSIS — N6459 Other signs and symptoms in breast: Secondary | ICD-10-CM

## 2018-10-14 DIAGNOSIS — N644 Mastodynia: Secondary | ICD-10-CM | POA: Diagnosis not present

## 2018-10-14 MED ORDER — GADOBUTROL 1 MMOL/ML IV SOLN
5.0000 mL | Freq: Once | INTRAVENOUS | Status: AC | PRN
  Administered 2018-10-14: 5 mL via INTRAVENOUS

## 2018-10-16 ENCOUNTER — Other Ambulatory Visit: Payer: Self-pay | Admitting: General Surgery

## 2018-10-16 DIAGNOSIS — M329 Systemic lupus erythematosus, unspecified: Secondary | ICD-10-CM | POA: Diagnosis not present

## 2018-10-16 DIAGNOSIS — Z79899 Other long term (current) drug therapy: Secondary | ICD-10-CM | POA: Diagnosis not present

## 2018-10-16 DIAGNOSIS — H04123 Dry eye syndrome of bilateral lacrimal glands: Secondary | ICD-10-CM | POA: Diagnosis not present

## 2018-10-16 DIAGNOSIS — R9389 Abnormal findings on diagnostic imaging of other specified body structures: Secondary | ICD-10-CM

## 2018-10-22 ENCOUNTER — Ambulatory Visit
Admission: RE | Admit: 2018-10-22 | Discharge: 2018-10-22 | Disposition: A | Discharge status: Home / Self Care | Payer: Medicare Other | Source: Ambulatory Visit | Attending: General Surgery | Admitting: General Surgery

## 2018-10-22 DIAGNOSIS — R9389 Abnormal findings on diagnostic imaging of other specified body structures: Secondary | ICD-10-CM

## 2018-10-22 DIAGNOSIS — R928 Other abnormal and inconclusive findings on diagnostic imaging of breast: Secondary | ICD-10-CM | POA: Diagnosis not present

## 2018-10-22 DIAGNOSIS — N6012 Diffuse cystic mastopathy of left breast: Secondary | ICD-10-CM | POA: Diagnosis not present

## 2018-10-22 HISTORY — PX: BREAST BIOPSY: SHX20

## 2018-10-22 MED ORDER — GADOBUTROL 1 MMOL/ML IV SOLN
6.0000 mL | Freq: Once | INTRAVENOUS | Status: AC | PRN
  Administered 2018-10-22: 6 mL via INTRAVENOUS

## 2018-10-27 DIAGNOSIS — Z6821 Body mass index (BMI) 21.0-21.9, adult: Secondary | ICD-10-CM | POA: Diagnosis not present

## 2018-10-27 DIAGNOSIS — N952 Postmenopausal atrophic vaginitis: Secondary | ICD-10-CM | POA: Diagnosis not present

## 2018-10-27 DIAGNOSIS — N898 Other specified noninflammatory disorders of vagina: Secondary | ICD-10-CM | POA: Diagnosis not present

## 2018-10-27 DIAGNOSIS — Z01419 Encounter for gynecological examination (general) (routine) without abnormal findings: Secondary | ICD-10-CM | POA: Diagnosis not present

## 2018-10-27 DIAGNOSIS — N6459 Other signs and symptoms in breast: Secondary | ICD-10-CM | POA: Diagnosis not present

## 2018-12-05 DIAGNOSIS — L218 Other seborrheic dermatitis: Secondary | ICD-10-CM | POA: Diagnosis not present

## 2019-03-03 DIAGNOSIS — R309 Painful micturition, unspecified: Secondary | ICD-10-CM | POA: Diagnosis not present

## 2019-04-21 DIAGNOSIS — R300 Dysuria: Secondary | ICD-10-CM | POA: Diagnosis not present

## 2019-04-21 DIAGNOSIS — R309 Painful micturition, unspecified: Secondary | ICD-10-CM | POA: Diagnosis not present

## 2019-04-21 DIAGNOSIS — B373 Candidiasis of vulva and vagina: Secondary | ICD-10-CM | POA: Diagnosis not present

## 2019-04-21 DIAGNOSIS — R102 Pelvic and perineal pain: Secondary | ICD-10-CM | POA: Diagnosis not present

## 2019-04-21 DIAGNOSIS — N898 Other specified noninflammatory disorders of vagina: Secondary | ICD-10-CM | POA: Diagnosis not present

## 2019-05-21 DIAGNOSIS — R102 Pelvic and perineal pain: Secondary | ICD-10-CM | POA: Diagnosis not present

## 2019-05-21 DIAGNOSIS — N390 Urinary tract infection, site not specified: Secondary | ICD-10-CM | POA: Diagnosis not present

## 2019-05-26 DIAGNOSIS — N899 Noninflammatory disorder of vagina, unspecified: Secondary | ICD-10-CM | POA: Diagnosis not present

## 2019-05-26 DIAGNOSIS — R102 Pelvic and perineal pain: Secondary | ICD-10-CM | POA: Diagnosis not present

## 2019-05-26 DIAGNOSIS — N898 Other specified noninflammatory disorders of vagina: Secondary | ICD-10-CM | POA: Diagnosis not present

## 2019-06-23 DIAGNOSIS — N898 Other specified noninflammatory disorders of vagina: Secondary | ICD-10-CM | POA: Diagnosis not present

## 2019-06-23 DIAGNOSIS — N390 Urinary tract infection, site not specified: Secondary | ICD-10-CM | POA: Diagnosis not present

## 2019-07-03 DIAGNOSIS — N6459 Other signs and symptoms in breast: Secondary | ICD-10-CM | POA: Diagnosis not present

## 2019-07-06 ENCOUNTER — Other Ambulatory Visit: Payer: Self-pay | Admitting: General Surgery

## 2019-07-06 DIAGNOSIS — N6459 Other signs and symptoms in breast: Secondary | ICD-10-CM

## 2019-07-09 ENCOUNTER — Other Ambulatory Visit: Payer: Self-pay | Admitting: General Surgery

## 2019-07-09 DIAGNOSIS — N6320 Unspecified lump in the left breast, unspecified quadrant: Secondary | ICD-10-CM

## 2019-07-13 ENCOUNTER — Ambulatory Visit: Payer: Medicare Other

## 2019-07-21 ENCOUNTER — Ambulatory Visit
Admission: RE | Admit: 2019-07-21 | Discharge: 2019-07-21 | Disposition: A | Discharge status: Home / Self Care | Payer: Medicare Other | Source: Ambulatory Visit | Attending: General Surgery | Admitting: General Surgery

## 2019-07-21 ENCOUNTER — Other Ambulatory Visit: Payer: Self-pay | Admitting: General Surgery

## 2019-07-21 DIAGNOSIS — N6320 Unspecified lump in the left breast, unspecified quadrant: Secondary | ICD-10-CM

## 2019-07-21 DIAGNOSIS — N6459 Other signs and symptoms in breast: Secondary | ICD-10-CM | POA: Insufficient documentation

## 2019-07-21 DIAGNOSIS — R928 Other abnormal and inconclusive findings on diagnostic imaging of breast: Secondary | ICD-10-CM | POA: Diagnosis not present

## 2019-07-21 DIAGNOSIS — N6489 Other specified disorders of breast: Secondary | ICD-10-CM | POA: Diagnosis not present

## 2019-09-10 DIAGNOSIS — Z23 Encounter for immunization: Secondary | ICD-10-CM | POA: Diagnosis not present

## 2019-11-17 DIAGNOSIS — M545 Low back pain: Secondary | ICD-10-CM | POA: Diagnosis not present

## 2019-11-17 DIAGNOSIS — R3129 Other microscopic hematuria: Secondary | ICD-10-CM | POA: Diagnosis not present

## 2019-11-27 DIAGNOSIS — K9289 Other specified diseases of the digestive system: Secondary | ICD-10-CM | POA: Diagnosis not present

## 2020-02-15 DIAGNOSIS — N200 Calculus of kidney: Secondary | ICD-10-CM | POA: Diagnosis not present

## 2020-02-15 DIAGNOSIS — R3121 Asymptomatic microscopic hematuria: Secondary | ICD-10-CM | POA: Diagnosis not present

## 2020-02-22 DIAGNOSIS — N951 Menopausal and female climacteric states: Secondary | ICD-10-CM | POA: Diagnosis not present

## 2020-02-22 DIAGNOSIS — K589 Irritable bowel syndrome without diarrhea: Secondary | ICD-10-CM | POA: Diagnosis not present

## 2020-02-22 DIAGNOSIS — N898 Other specified noninflammatory disorders of vagina: Secondary | ICD-10-CM | POA: Diagnosis not present

## 2020-02-23 DIAGNOSIS — N951 Menopausal and female climacteric states: Secondary | ICD-10-CM | POA: Diagnosis not present

## 2020-03-01 DIAGNOSIS — N951 Menopausal and female climacteric states: Secondary | ICD-10-CM | POA: Diagnosis not present

## 2020-03-01 DIAGNOSIS — M810 Age-related osteoporosis without current pathological fracture: Secondary | ICD-10-CM | POA: Diagnosis not present

## 2020-03-01 DIAGNOSIS — E559 Vitamin D deficiency, unspecified: Secondary | ICD-10-CM | POA: Diagnosis not present

## 2020-03-01 DIAGNOSIS — E875 Hyperkalemia: Secondary | ICD-10-CM | POA: Diagnosis not present

## 2020-03-08 DIAGNOSIS — S00412A Abrasion of left ear, initial encounter: Secondary | ICD-10-CM | POA: Diagnosis not present

## 2020-04-22 DIAGNOSIS — M810 Age-related osteoporosis without current pathological fracture: Secondary | ICD-10-CM | POA: Diagnosis not present

## 2020-04-22 DIAGNOSIS — B373 Candidiasis of vulva and vagina: Secondary | ICD-10-CM | POA: Diagnosis not present

## 2020-04-22 DIAGNOSIS — E559 Vitamin D deficiency, unspecified: Secondary | ICD-10-CM | POA: Diagnosis not present

## 2020-04-22 DIAGNOSIS — N760 Acute vaginitis: Secondary | ICD-10-CM | POA: Diagnosis not present

## 2020-05-23 ENCOUNTER — Ambulatory Visit: Payer: Medicare Other

## 2020-09-14 DIAGNOSIS — L259 Unspecified contact dermatitis, unspecified cause: Secondary | ICD-10-CM | POA: Diagnosis not present

## 2020-10-11 DIAGNOSIS — R109 Unspecified abdominal pain: Secondary | ICD-10-CM | POA: Diagnosis not present

## 2020-10-12 DIAGNOSIS — R109 Unspecified abdominal pain: Secondary | ICD-10-CM | POA: Diagnosis not present

## 2020-10-19 DIAGNOSIS — R1013 Epigastric pain: Secondary | ICD-10-CM | POA: Diagnosis not present

## 2020-10-19 DIAGNOSIS — R197 Diarrhea, unspecified: Secondary | ICD-10-CM | POA: Diagnosis not present

## 2021-06-26 DIAGNOSIS — E559 Vitamin D deficiency, unspecified: Secondary | ICD-10-CM | POA: Diagnosis not present

## 2021-06-26 DIAGNOSIS — M810 Age-related osteoporosis without current pathological fracture: Secondary | ICD-10-CM | POA: Diagnosis not present

## 2021-06-26 DIAGNOSIS — Z1231 Encounter for screening mammogram for malignant neoplasm of breast: Secondary | ICD-10-CM | POA: Diagnosis not present

## 2021-06-26 DIAGNOSIS — Z79890 Hormone replacement therapy (postmenopausal): Secondary | ICD-10-CM | POA: Diagnosis not present

## 2021-06-26 DIAGNOSIS — Z01419 Encounter for gynecological examination (general) (routine) without abnormal findings: Secondary | ICD-10-CM | POA: Diagnosis not present

## 2021-06-26 DIAGNOSIS — E2839 Other primary ovarian failure: Secondary | ICD-10-CM | POA: Diagnosis not present

## 2021-06-26 DIAGNOSIS — Z113 Encounter for screening for infections with a predominantly sexual mode of transmission: Secondary | ICD-10-CM | POA: Diagnosis not present

## 2021-10-24 DIAGNOSIS — J0140 Acute pansinusitis, unspecified: Secondary | ICD-10-CM | POA: Diagnosis not present

## 2022-07-27 DIAGNOSIS — K5900 Constipation, unspecified: Secondary | ICD-10-CM | POA: Diagnosis not present

## 2022-07-31 DIAGNOSIS — K589 Irritable bowel syndrome without diarrhea: Secondary | ICD-10-CM | POA: Diagnosis not present

## 2022-07-31 DIAGNOSIS — Z136 Encounter for screening for cardiovascular disorders: Secondary | ICD-10-CM | POA: Diagnosis not present

## 2022-07-31 DIAGNOSIS — M069 Rheumatoid arthritis, unspecified: Secondary | ICD-10-CM | POA: Diagnosis not present

## 2022-07-31 DIAGNOSIS — M329 Systemic lupus erythematosus, unspecified: Secondary | ICD-10-CM | POA: Diagnosis not present

## 2022-07-31 DIAGNOSIS — Z0000 Encounter for general adult medical examination without abnormal findings: Secondary | ICD-10-CM | POA: Diagnosis not present

## 2022-07-31 DIAGNOSIS — Z79899 Other long term (current) drug therapy: Secondary | ICD-10-CM | POA: Diagnosis not present

## 2022-11-06 DIAGNOSIS — R0989 Other specified symptoms and signs involving the circulatory and respiratory systems: Secondary | ICD-10-CM | POA: Diagnosis not present

## 2022-11-06 DIAGNOSIS — R051 Acute cough: Secondary | ICD-10-CM | POA: Diagnosis not present

## 2022-11-08 DIAGNOSIS — Z113 Encounter for screening for infections with a predominantly sexual mode of transmission: Secondary | ICD-10-CM | POA: Diagnosis not present

## 2022-11-08 DIAGNOSIS — Z23 Encounter for immunization: Secondary | ICD-10-CM | POA: Diagnosis not present

## 2022-11-08 DIAGNOSIS — Z6821 Body mass index (BMI) 21.0-21.9, adult: Secondary | ICD-10-CM | POA: Diagnosis not present

## 2022-11-08 DIAGNOSIS — Z1231 Encounter for screening mammogram for malignant neoplasm of breast: Secondary | ICD-10-CM | POA: Diagnosis not present

## 2022-11-08 DIAGNOSIS — Z01419 Encounter for gynecological examination (general) (routine) without abnormal findings: Secondary | ICD-10-CM | POA: Diagnosis not present

## 2022-11-08 DIAGNOSIS — Z79890 Hormone replacement therapy (postmenopausal): Secondary | ICD-10-CM | POA: Diagnosis not present

## 2022-11-08 DIAGNOSIS — E2839 Other primary ovarian failure: Secondary | ICD-10-CM | POA: Diagnosis not present

## 2022-11-08 DIAGNOSIS — M810 Age-related osteoporosis without current pathological fracture: Secondary | ICD-10-CM | POA: Diagnosis not present

## 2022-11-09 ENCOUNTER — Other Ambulatory Visit: Payer: Self-pay | Admitting: Obstetrics and Gynecology

## 2022-11-09 DIAGNOSIS — M810 Age-related osteoporosis without current pathological fracture: Secondary | ICD-10-CM

## 2023-01-28 DIAGNOSIS — N898 Other specified noninflammatory disorders of vagina: Secondary | ICD-10-CM | POA: Diagnosis not present

## 2023-05-08 DIAGNOSIS — N6320 Unspecified lump in the left breast, unspecified quadrant: Secondary | ICD-10-CM | POA: Diagnosis not present

## 2023-05-08 DIAGNOSIS — N6459 Other signs and symptoms in breast: Secondary | ICD-10-CM | POA: Diagnosis not present

## 2023-05-08 DIAGNOSIS — N898 Other specified noninflammatory disorders of vagina: Secondary | ICD-10-CM | POA: Diagnosis not present

## 2023-05-08 DIAGNOSIS — B3732 Chronic candidiasis of vulva and vagina: Secondary | ICD-10-CM | POA: Diagnosis not present

## 2023-05-10 ENCOUNTER — Other Ambulatory Visit: Payer: Self-pay | Admitting: Obstetrics and Gynecology

## 2023-05-10 DIAGNOSIS — N6320 Unspecified lump in the left breast, unspecified quadrant: Secondary | ICD-10-CM

## 2023-05-15 ENCOUNTER — Ambulatory Visit
Admission: RE | Admit: 2023-05-15 | Discharge: 2023-05-15 | Disposition: A | Discharge status: Home / Self Care | Payer: Medicare Other | Source: Ambulatory Visit | Attending: Obstetrics and Gynecology | Admitting: Obstetrics and Gynecology

## 2023-05-15 DIAGNOSIS — N6320 Unspecified lump in the left breast, unspecified quadrant: Secondary | ICD-10-CM

## 2023-05-15 DIAGNOSIS — N6042 Mammary duct ectasia of left breast: Secondary | ICD-10-CM | POA: Diagnosis not present

## 2023-05-15 DIAGNOSIS — N6342 Unspecified lump in left breast, subareolar: Secondary | ICD-10-CM | POA: Diagnosis not present

## 2023-11-11 ENCOUNTER — Other Ambulatory Visit: Payer: Self-pay | Admitting: Obstetrics and Gynecology

## 2023-11-11 DIAGNOSIS — M810 Age-related osteoporosis without current pathological fracture: Secondary | ICD-10-CM

## 2024-07-03 ENCOUNTER — Other Ambulatory Visit: Payer: Medicare Other
